# Patient Record
Sex: Female | Born: 1945 | Race: White | Hispanic: No | Smoking: Former smoker
Health system: Southern US, Community
[De-identification: ages and names within clinical notes are randomized; demographics above are authoritative.]

## PROBLEM LIST (undated history)

## (undated) DIAGNOSIS — D0510 Intraductal carcinoma in situ of unspecified breast: Secondary | ICD-10-CM

## (undated) DIAGNOSIS — C50919 Malignant neoplasm of unspecified site of unspecified female breast: Secondary | ICD-10-CM

## (undated) DIAGNOSIS — Z8669 Personal history of other diseases of the nervous system and sense organs: Secondary | ICD-10-CM

## (undated) DIAGNOSIS — R7303 Prediabetes: Secondary | ICD-10-CM

## (undated) DIAGNOSIS — H269 Unspecified cataract: Secondary | ICD-10-CM

## (undated) DIAGNOSIS — G4730 Sleep apnea, unspecified: Secondary | ICD-10-CM

## (undated) DIAGNOSIS — F419 Anxiety disorder, unspecified: Secondary | ICD-10-CM

## (undated) DIAGNOSIS — D649 Anemia, unspecified: Secondary | ICD-10-CM

## (undated) DIAGNOSIS — Z923 Personal history of irradiation: Secondary | ICD-10-CM

## (undated) HISTORY — DX: Personal history of other diseases of the nervous system and sense organs: Z86.69

## (undated) HISTORY — PX: UVULECTOMY: SHX2631

## (undated) HISTORY — DX: Anemia, unspecified: D64.9

## (undated) HISTORY — PX: APPENDECTOMY: SHX54

## (undated) HISTORY — PX: EYE SURGERY: SHX253

## (undated) HISTORY — PX: CHOLECYSTECTOMY: SHX55

## (undated) HISTORY — DX: Malignant neoplasm of unspecified site of unspecified female breast: C50.919

## (undated) HISTORY — DX: Unspecified cataract: H26.9

## (undated) HISTORY — PX: BUNIONECTOMY: SHX129

## (undated) HISTORY — PX: IRIDOTOMY / IRIDECTOMY: SHX165

---

## 1991-01-28 HISTORY — PX: BREAST EXCISIONAL BIOPSY: SUR124

## 2004-02-08 ENCOUNTER — Ambulatory Visit: Payer: Self-pay | Admitting: Family Medicine

## 2005-05-14 ENCOUNTER — Ambulatory Visit: Payer: Self-pay

## 2006-05-13 ENCOUNTER — Ambulatory Visit: Payer: Self-pay

## 2007-05-26 ENCOUNTER — Ambulatory Visit: Payer: Self-pay

## 2008-06-06 ENCOUNTER — Ambulatory Visit: Payer: Self-pay

## 2009-06-26 ENCOUNTER — Ambulatory Visit: Payer: Self-pay

## 2010-09-02 ENCOUNTER — Ambulatory Visit: Payer: Self-pay | Admitting: Family Medicine

## 2010-10-08 ENCOUNTER — Ambulatory Visit: Payer: Self-pay | Admitting: Gastroenterology

## 2010-10-09 LAB — PATHOLOGY REPORT

## 2011-09-03 ENCOUNTER — Ambulatory Visit: Payer: Self-pay | Admitting: Family Medicine

## 2012-09-07 ENCOUNTER — Ambulatory Visit: Payer: Self-pay | Admitting: Family Medicine

## 2013-09-08 ENCOUNTER — Ambulatory Visit: Payer: Self-pay | Admitting: Family Medicine

## 2014-09-14 ENCOUNTER — Other Ambulatory Visit: Payer: Self-pay | Admitting: Family Medicine

## 2014-10-18 ENCOUNTER — Telehealth: Payer: Self-pay

## 2014-10-18 NOTE — Telephone Encounter (Signed)
Opened in error

## 2014-10-24 ENCOUNTER — Other Ambulatory Visit: Payer: Self-pay | Admitting: Family Medicine

## 2014-10-24 DIAGNOSIS — Z1231 Encounter for screening mammogram for malignant neoplasm of breast: Secondary | ICD-10-CM

## 2014-11-30 ENCOUNTER — Ambulatory Visit
Admission: RE | Admit: 2014-11-30 | Discharge: 2014-11-30 | Disposition: A | Discharge status: Home / Self Care | Payer: Medicare Other | Source: Ambulatory Visit | Attending: Family Medicine | Admitting: Family Medicine

## 2014-11-30 DIAGNOSIS — Z1231 Encounter for screening mammogram for malignant neoplasm of breast: Secondary | ICD-10-CM | POA: Diagnosis not present

## 2015-05-30 DIAGNOSIS — H2513 Age-related nuclear cataract, bilateral: Secondary | ICD-10-CM | POA: Diagnosis not present

## 2015-07-17 DIAGNOSIS — H2189 Other specified disorders of iris and ciliary body: Secondary | ICD-10-CM | POA: Diagnosis not present

## 2015-07-17 DIAGNOSIS — H2513 Age-related nuclear cataract, bilateral: Secondary | ICD-10-CM | POA: Diagnosis not present

## 2015-07-17 DIAGNOSIS — H40033 Anatomical narrow angle, bilateral: Secondary | ICD-10-CM | POA: Diagnosis not present

## 2015-10-25 DIAGNOSIS — B001 Herpesviral vesicular dermatitis: Secondary | ICD-10-CM | POA: Diagnosis not present

## 2015-10-25 DIAGNOSIS — Z6827 Body mass index (BMI) 27.0-27.9, adult: Secondary | ICD-10-CM | POA: Diagnosis not present

## 2015-10-25 DIAGNOSIS — L299 Pruritus, unspecified: Secondary | ICD-10-CM | POA: Diagnosis not present

## 2015-10-25 DIAGNOSIS — E782 Mixed hyperlipidemia: Secondary | ICD-10-CM | POA: Diagnosis not present

## 2015-10-25 DIAGNOSIS — E663 Overweight: Secondary | ICD-10-CM | POA: Diagnosis not present

## 2015-10-25 DIAGNOSIS — R7303 Prediabetes: Secondary | ICD-10-CM | POA: Diagnosis not present

## 2015-10-25 DIAGNOSIS — Z1159 Encounter for screening for other viral diseases: Secondary | ICD-10-CM | POA: Diagnosis not present

## 2015-10-25 DIAGNOSIS — Z0000 Encounter for general adult medical examination without abnormal findings: Secondary | ICD-10-CM | POA: Diagnosis not present

## 2015-10-25 DIAGNOSIS — I781 Nevus, non-neoplastic: Secondary | ICD-10-CM | POA: Diagnosis not present

## 2015-11-22 DIAGNOSIS — R740 Nonspecific elevation of levels of transaminase and lactic acid dehydrogenase [LDH]: Secondary | ICD-10-CM | POA: Diagnosis not present

## 2015-11-30 DIAGNOSIS — H40033 Anatomical narrow angle, bilateral: Secondary | ICD-10-CM | POA: Diagnosis not present

## 2015-11-30 DIAGNOSIS — H2513 Age-related nuclear cataract, bilateral: Secondary | ICD-10-CM | POA: Diagnosis not present

## 2015-12-11 DIAGNOSIS — H40039 Anatomical narrow angle, unspecified eye: Secondary | ICD-10-CM | POA: Diagnosis not present

## 2015-12-11 DIAGNOSIS — H2189 Other specified disorders of iris and ciliary body: Secondary | ICD-10-CM | POA: Diagnosis not present

## 2015-12-11 DIAGNOSIS — H2513 Age-related nuclear cataract, bilateral: Secondary | ICD-10-CM | POA: Diagnosis not present

## 2015-12-24 ENCOUNTER — Other Ambulatory Visit: Payer: Self-pay | Admitting: Nurse Practitioner

## 2015-12-24 DIAGNOSIS — Z1231 Encounter for screening mammogram for malignant neoplasm of breast: Secondary | ICD-10-CM

## 2016-01-30 ENCOUNTER — Ambulatory Visit: Payer: Medicare Other

## 2016-03-07 ENCOUNTER — Ambulatory Visit
Admission: RE | Admit: 2016-03-07 | Discharge: 2016-03-07 | Disposition: A | Discharge status: Home / Self Care | Payer: Medicare Other | Source: Ambulatory Visit | Attending: Nurse Practitioner | Admitting: Nurse Practitioner

## 2016-03-07 DIAGNOSIS — Z1231 Encounter for screening mammogram for malignant neoplasm of breast: Secondary | ICD-10-CM | POA: Diagnosis not present

## 2016-07-08 DIAGNOSIS — H2513 Age-related nuclear cataract, bilateral: Secondary | ICD-10-CM | POA: Diagnosis not present

## 2016-07-08 DIAGNOSIS — H40033 Anatomical narrow angle, bilateral: Secondary | ICD-10-CM | POA: Diagnosis not present

## 2016-10-28 DIAGNOSIS — Z5329 Procedure and treatment not carried out because of patient's decision for other reasons: Secondary | ICD-10-CM | POA: Diagnosis not present

## 2016-10-28 DIAGNOSIS — B001 Herpesviral vesicular dermatitis: Secondary | ICD-10-CM | POA: Diagnosis not present

## 2016-10-28 DIAGNOSIS — E782 Mixed hyperlipidemia: Secondary | ICD-10-CM | POA: Diagnosis not present

## 2016-10-28 DIAGNOSIS — R740 Nonspecific elevation of levels of transaminase and lactic acid dehydrogenase [LDH]: Secondary | ICD-10-CM | POA: Diagnosis not present

## 2016-11-20 DIAGNOSIS — Z23 Encounter for immunization: Secondary | ICD-10-CM | POA: Diagnosis not present

## 2017-01-08 ENCOUNTER — Other Ambulatory Visit: Payer: Self-pay | Admitting: Nurse Practitioner

## 2017-01-08 DIAGNOSIS — Z1231 Encounter for screening mammogram for malignant neoplasm of breast: Secondary | ICD-10-CM

## 2017-03-16 ENCOUNTER — Ambulatory Visit
Admission: RE | Admit: 2017-03-16 | Discharge: 2017-03-16 | Disposition: A | Discharge status: Home / Self Care | Payer: Medicare Other | Source: Ambulatory Visit | Attending: Nurse Practitioner | Admitting: Nurse Practitioner

## 2017-03-16 DIAGNOSIS — Z1231 Encounter for screening mammogram for malignant neoplasm of breast: Secondary | ICD-10-CM | POA: Diagnosis not present

## 2017-06-09 DIAGNOSIS — H40033 Anatomical narrow angle, bilateral: Secondary | ICD-10-CM | POA: Diagnosis not present

## 2017-06-09 DIAGNOSIS — H2513 Age-related nuclear cataract, bilateral: Secondary | ICD-10-CM | POA: Diagnosis not present

## 2017-06-09 DIAGNOSIS — H43393 Other vitreous opacities, bilateral: Secondary | ICD-10-CM | POA: Diagnosis not present

## 2017-11-11 ENCOUNTER — Ambulatory Visit: Payer: Self-pay | Admitting: Nurse Practitioner

## 2017-11-11 ENCOUNTER — Other Ambulatory Visit: Payer: Self-pay

## 2017-11-11 ENCOUNTER — Ambulatory Visit (INDEPENDENT_AMBULATORY_CARE_PROVIDER_SITE_OTHER): Payer: Medicare Other | Admitting: Nurse Practitioner

## 2017-11-11 ENCOUNTER — Encounter: Payer: Self-pay | Admitting: Nurse Practitioner

## 2017-11-11 VITALS — Ht 67.0 in | Wt 124.0 lb

## 2017-11-11 DIAGNOSIS — F419 Anxiety disorder, unspecified: Secondary | ICD-10-CM | POA: Diagnosis not present

## 2017-11-11 DIAGNOSIS — Z7689 Persons encountering health services in other specified circumstances: Secondary | ICD-10-CM | POA: Diagnosis not present

## 2017-11-11 DIAGNOSIS — F5104 Psychophysiologic insomnia: Secondary | ICD-10-CM | POA: Diagnosis not present

## 2017-11-11 DIAGNOSIS — Z23 Encounter for immunization: Secondary | ICD-10-CM | POA: Diagnosis not present

## 2017-11-11 MED ORDER — TRAZODONE HCL 50 MG PO TABS: 25.0000 mg | ORAL_TABLET | Freq: Every evening | ORAL | 5 refills | Status: DC | PRN

## 2017-11-11 NOTE — Progress Notes (Signed)
Subjective:    Patient ID: Tracie Reed, female    DOB: 02-08-1945, 72 y.o.   MRN: [id number]  Tracie Reed is a 72 y.o. female presenting on 11/11/2017 for Establish Care (anxiety , insomnia. )   HPI Establish Care New Provider Pt last seen by PCP [facility] about 1 years ago.  Obtain records.   - Patient sees regular dentist at [facility] - Patient sees regular eye doc at [facility] - will need new optometrist  Last Colonoscopy 2012 - 10 year follow-up.  Anxiety Worsening over the last several months.  Patient notes recent care has been provided non-pharmacologically.  These self-care and coping skills are currently failing.  Patient reports she has been on medications > 15-30 years ago - Lithium, Valium, Prozac.  Does not particularly wish to restart these, however. - Has tried managing with meditation, yoga, exercise. 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  These things are leading to worsening anxiety.  Depression screen [facility] 2/9 12/15/2017 11/11/2017  Decreased Interest 0 0  Down, Depressed, Hopeless 1 0  PHQ - 2 Score 1 0  Altered sleeping - 3  Tired, decreased energy - 1  Change in appetite - 0  Feeling bad or failure about yourself  - 1  Trouble concentrating - 0  Moving slowly or fidgety/restless - 0  Suicidal thoughts - 0  PHQ-9 Score - 5  Difficult doing work/chores - Somewhat difficult    GAD 7 : Generalized Anxiety Score 11/11/2017  Nervous, Anxious, on Edge 3  Control/stop worrying 3  Worry too much - different things 3  Trouble relaxing 3  Restless 2  Easily annoyed or irritable 3  Afraid - awful might happen 3  Total GAD 7 Score 20  Anxiety Difficulty Somewhat difficult   Insomnia Largest concern is insomnia - sleeps 3-4 hours, wakes and has difficulty returning to sleep.  Wakes to urinate, leg cramps and has mind racing/wandering. Sleep  maintenance.   Has used melatonin 10 mg nightly without relief, lavendar/chamomile tea,   Tinnitus - worsening.  Is very loud now in LEFT ear.  Usually able to ignore it.  Is not leading to increased anxiety per patient report.   Past Medical History:  Diagnosis Date  . Anemia    Past Surgical History:  Procedure Laterality Date  . BREAST EXCISIONAL BIOPSY Right 1993   neg   Social History   Socioeconomic History  . Marital status: Significant Other    Spouse name: Not on file  . Number of children: 2  . Years of education: Not on file  . Highest education level: Bachelor's degree (e.g., BA, AB, BS)  Occupational History  . Occupation: self employed    Comment: Holiday representative  Social Needs  . Financial resource strain: Not hard at all  . Food insecurity:    Worry: Never true    Inability: Never true  . Transportation needs:    Medical: No    Non-medical: No  Tobacco Use  . Smoking status: Former Smoker    Last attempt to quit: 11/12/1987    Years since quitting: 30.1  . Smokeless tobacco: Never Used  Substance and Sexual Activity  . Alcohol use: Yes    Alcohol/week: 5.0 standard drinks    Types: 5 Glasses of wine per week    Comment: weekly  . Drug use: Never  . Sexual activity: Not  on file  Lifestyle  . Physical activity:    Days per week: 0 days    Minutes per session: 0 min  . Stress: Not at all  Relationships  . Social connections:    Talks on phone: Patient refused    Gets together: Patient refused    Attends religious service: Patient refused    Active member of club or organization: Patient refused    Attends meetings of clubs or organizations: Patient refused    Relationship status: Patient refused  . Intimate partner violence:    Fear of current or ex partner: Patient refused    Emotionally abused: Patient refused    Physically abused: Patient refused    Forced sexual activity: Patient refused  Other Topics Concern  .  Not on file  Social History Narrative  . Not on file   Family History  Problem Relation Age of Onset  . Breast cancer Neg Hx    Current Outpatient Medications on File Prior to Visit  Medication Sig  . triamcinolone ointment (KENALOG) 0.5 % APPLY AA BID UNTIL CLEARED   No current facility-administered medications on file prior to visit.     Review of Systems  Constitutional: Negative for activity change and appetite change.  HENT: Positive for tinnitus. Negative for congestion, dental problem, nosebleeds, rhinorrhea, sinus pain and voice change.   Eyes: Negative for visual disturbance.  Respiratory: Negative for cough, chest tightness and shortness of breath.   Cardiovascular: Negative for chest pain, palpitations and leg swelling.  Gastrointestinal: Negative for abdominal pain, constipation and diarrhea.  Endocrine: Negative for cold intolerance, heat intolerance and polyphagia.  Genitourinary: Negative for difficulty urinating.  Musculoskeletal: Positive for arthralgias.  Skin: Negative for color change, rash and wound.  Neurological: Negative for dizziness, seizures, speech difficulty, weakness and headaches.  Hematological: Negative for adenopathy. Does not bruise/bleed easily.  Psychiatric/Behavioral: Positive for sleep disturbance. The patient is nervous/anxious.    Per HPI unless specifically indicated above     Objective:    Ht 5\' 7"  (1.702 m)   Wt 124 lb (56.2 kg)   BMI 19.42 kg/m   Wt Readings from Last 3 Encounters:  12/15/17 126 lb (57.2 kg)  12/15/17 126 lb 3.2 oz (57.2 kg)  11/11/17 124 lb (56.2 kg)    Physical Exam  Constitutional: She is oriented to person, place, and time. She appears well-developed and well-nourished. No distress.  HENT:  Head: Normocephalic and atraumatic.  Cardiovascular: Normal rate, regular rhythm, S1 normal, S2 normal, normal heart sounds and intact distal pulses.  Pulmonary/Chest: Effort normal and breath sounds normal. No  respiratory distress.  Neurological: She is alert and oriented to person, place, and time.  Skin: Skin is warm and dry.  Psychiatric: Her behavior is normal. Judgment and thought content normal. Her mood appears anxious. Her speech is rapid and/or pressured and tangential. Cognition and memory are normal. She expresses no homicidal and no suicidal ideation. She expresses no suicidal plans and no homicidal plans.  Vitals reviewed.    No results found for this or any previous visit.    Assessment & Plan:   Problem List Items Addressed This Visit      Other   Anxiety   Relevant Medications   traZODone (DESYREL) 50 MG tablet   Psychophysiological insomnia - Primary For anxiety and insomnia - is currently uncontrolled.  Anxiety likely worsening 2/2 poor sleep.  Patient states is most bothered by insomnia and has previously had good non-pharm management for anxiety.  Plan: 1. Start trazodone 1/2 tab (25 mg) at bedtime 2. Resume non-pharm stress management tools. 3. FOLLOW-UP 4 weeks.   Relevant Medications   traZODone (DESYREL) 50 MG tablet    Other Visit Diagnoses    Encounter to establish care     Previous PCP was at [facility].  Records will be requested.  Past medical, family, and surgical history reviewed w/ pt.     Needs flu shot     Pt > age 12.  Needs annual influenza vaccine.  Plan: 1. Administer high dose fluzone today.    Relevant Orders   Flu vaccine HIGH DOSE PF (Fluzone High dose) (Completed)      Meds ordered this encounter  Medications  . traZODone (DESYREL) 50 MG tablet    Sig: Take 0.5-1 tablets (25-50 mg total) by mouth at bedtime as needed for sleep.    Dispense:  30 tablet    Refill:  5    Order Specific Question:   Supervising Provider    Answer:   Olin Hauser [[id number]]   Follow up plan: Return in about 4 weeks (around 12/09/2017) for anxiety and insomnia AND MWV with Tiffany .   Cassell Smiles, DNP, AGPCNP-BC Adult  Gerontology Primary Care Nurse Practitioner [facility] 11/11/2017, 9:47 AM

## 2017-11-11 NOTE — Patient Instructions (Addendum)
Tracie Reed,   Thank you for coming in to clinic today.  1. For sleep: - Make sure your melatonin has USP seal for certification.   - Nature Made always has this certification. - Take 5-10 mg about 30 minutes before sleep. - Also consider extended release formulation for melatonin to extend its active life at night.  2. Anxiety: - Continue non-medicine options for coping.  Bring back a regular meditation or mindfulness.  - Consider medications in about 4 weeks.  Please schedule a follow-up appointment with Cassell Smiles, AGNP. Return in about 4 weeks (around 12/09/2017) for anxiety and insomnia AND MWV with Tiffany .  If you have any other questions or concerns, please feel free to call the clinic or send a message through [place]. You may also schedule an earlier appointment if necessary.  You will receive a survey after today's visit either digitally by e-mail or paper by [employer]. Your experiences and feedback matter to [place].  Please respond so we know how we are doing as we provide care for you.   Cassell Smiles, DNP, AGNP-BC Adult Gerontology Nurse Practitioner [place]

## 2017-12-15 ENCOUNTER — Ambulatory Visit (INDEPENDENT_AMBULATORY_CARE_PROVIDER_SITE_OTHER): Payer: Medicare Other | Admitting: Nurse Practitioner

## 2017-12-15 ENCOUNTER — Other Ambulatory Visit: Payer: Self-pay | Admitting: Nurse Practitioner

## 2017-12-15 ENCOUNTER — Ambulatory Visit: Payer: Medicare Other

## 2017-12-15 ENCOUNTER — Other Ambulatory Visit: Payer: Self-pay

## 2017-12-15 ENCOUNTER — Ambulatory Visit (INDEPENDENT_AMBULATORY_CARE_PROVIDER_SITE_OTHER): Payer: Medicare Other

## 2017-12-15 ENCOUNTER — Encounter: Payer: Self-pay | Admitting: Nurse Practitioner

## 2017-12-15 VITALS — BP 117/66 | HR 88 | Temp 98.1°F | Ht 67.0 in | Wt 126.0 lb

## 2017-12-15 VITALS — BP 117/66 | HR 88 | Temp 98.1°F | Resp 17 | Ht 67.0 in | Wt 126.2 lb

## 2017-12-15 DIAGNOSIS — N760 Acute vaginitis: Secondary | ICD-10-CM | POA: Diagnosis not present

## 2017-12-15 DIAGNOSIS — B001 Herpesviral vesicular dermatitis: Secondary | ICD-10-CM

## 2017-12-15 DIAGNOSIS — Z136 Encounter for screening for cardiovascular disorders: Secondary | ICD-10-CM

## 2017-12-15 DIAGNOSIS — F5104 Psychophysiologic insomnia: Secondary | ICD-10-CM

## 2017-12-15 DIAGNOSIS — Z0000 Encounter for general adult medical examination without abnormal findings: Secondary | ICD-10-CM

## 2017-12-15 DIAGNOSIS — R7303 Prediabetes: Secondary | ICD-10-CM

## 2017-12-15 DIAGNOSIS — Z130 Encounter for screening for diseases of the blood and blood-forming organs and certain disorders involving the immune mechanism: Secondary | ICD-10-CM

## 2017-12-15 DIAGNOSIS — F419 Anxiety disorder, unspecified: Secondary | ICD-10-CM

## 2017-12-15 DIAGNOSIS — Z13220 Encounter for screening for lipoid disorders: Secondary | ICD-10-CM

## 2017-12-15 LAB — POCT WET PREP (WET MOUNT)
Clue Cells Wet Prep Whiff POC: NEGATIVE
Trichomonas Wet Prep HPF POC: ABSENT

## 2017-12-15 MED ORDER — VALACYCLOVIR HCL 1 G PO TABS: ORAL_TABLET | ORAL | 5 refills | Status: DC

## 2017-12-15 MED ORDER — ACYCLOVIR 5 % EX OINT: 1.0000 "application " | TOPICAL_OINTMENT | CUTANEOUS | 1 refills | Status: AC | PRN

## 2017-12-15 NOTE — Patient Instructions (Addendum)
Tracie Reed,   Thank you for coming in to clinic today.  1. Continue trazodone 25 mg (1/2 tablet) nightly for sleep and anxiety.  2. Continue all non medicine options for anxiety management.  3. Continue vaginal moisturizers as needed.  Consider any other brand similar to Vagisil, Coconut Oil, or request topical estrogen from me at another time.  4. Refills sent for valacyclovir and Zovirax.  Please schedule a follow-up appointment with Cassell Smiles, AGNP. Return in about 4 months (around 04/15/2018) for anxiety.  If you have any other questions or concerns, please feel free to call the clinic or send a message through [place]. You may also schedule an earlier appointment if necessary.  You will receive a survey after today's visit either digitally by e-mail or paper by [employer]. Your experiences and feedback matter to [place].  Please respond so we know how we are doing as we provide care for you.   Cassell Smiles, DNP, AGNP-BC Adult Gerontology Nurse Practitioner [facility], [facility]   Atrophic Vaginitis Atrophic vaginitis is a condition in which the tissues that line the vagina become dry and thin. This condition is most common in women who have stopped having regular menstrual periods (menopause). This usually starts when a woman is 48-61 years old. Estrogen helps to keep the vagina moist. It stimulates the vagina to produce a clear fluid that lubricates the vagina for sexual intercourse. This fluid also protects the vagina from infection. Lack of estrogen can cause the lining of the vagina to get thinner and dryer. The vagina may also shrink in size. It may become less elastic. Atrophic vaginitis tends to get worse over time as a woman's estrogen level drops. What are the causes? This condition is caused by the normal drop in estrogen that happens around the time of menopause. What increases the risk? Certain conditions or situations may lower a woman's estrogen  level, which increases her risk of atrophic vaginitis. These include:  Taking medicine that blocks estrogen.  Having ovaries removed surgically.  Being treated for cancer with X-ray treatment (radiation) or medicines (chemotherapy).  Exercising very hard and often.  Having an eating disorder (anorexia).  Giving birth or breastfeeding.  Being over the age of 29.  Smoking.  What are the signs or symptoms? Symptoms of this condition include:  Pain, soreness, or bleeding during sexual intercourse (dyspareunia).  Vaginal burning, irritation, or itching.  Pain or bleeding during a vaginal examination using a speculum (pelvic exam).  Loss of interest in sexual activity.  Having burning pain when passing urine.  Vaginal discharge that is brown or yellow.  In some cases, there are no symptoms. How is this diagnosed? This condition is diagnosed with a medical history and physical exam. This will include a pelvic exam that checks whether the inside of your vagina appears pale, thin, or dry. Rarely, you may also have other tests, including:  A urine test.  A test that checks the acid balance in your vaginal fluid (acid balance test).  How is this treated? Treatment for this condition may depend on the severity of your symptoms. Treatment may include:  Using an over-the-counter vaginal lubricant before you have sexual intercourse.  Using a long-acting vaginal moisturizer.  Using low-dose vaginal estrogen for moderate to severe symptoms that do not respond to other treatments. Options include creams, tablets, and inserts (vaginal rings). Before using vaginal estrogen, tell your health care provider if you have a history of: ? Breast cancer. ? Endometrial  cancer. ? Blood clots.  Taking medicines. You may be able to take a daily pill for dyspareunia. Discuss all of the risks of this medicine with your health care provider. It is usually not recommended for women who have a family  history or personal history of breast cancer.  If your symptoms are very mild and you are not sexually active, you may not need treatment. Follow these instructions at home:  Take medicines only as directed by your health care provider. Do not use herbal or alternative medicines unless your health care provider says that you can.  Use over-the-counter creams, lubricants, or moisturizers for dryness only as directed by your health care provider.  If your atrophic vaginitis is caused by menopause, discuss all of your menopausal symptoms and treatment options with your health care provider.  Do not douche.  Do not use products that can make your vagina dry. These include: ? Scented feminine sprays. ? Scented tampons. ? Scented soaps.  If it hurts to have sex, talk with your sexual partner. Contact a health care provider if:  Your discharge looks different than normal.  Your vagina has an unusual smell.  You have new symptoms.  Your symptoms do not improve with treatment.  Your symptoms get worse. This information is not intended to replace advice given to you by your health care provider. Make sure you discuss any questions you have with your health care provider. Document Released: 05/30/2014 Document Revised: 06/21/2015 Document Reviewed: 01/04/2014 Elsevier Interactive Patient Education  [employer].

## 2017-12-15 NOTE — Progress Notes (Signed)
Subjective:   Tracie Reed is a 72 y.o. female who presents for an Initial Medicare Annual Wellness Visit.  Review of Systems    N/A  Cardiac Risk Factors include: advanced age (>57men, >59 women);dyslipidemia     Objective:    Today's Vitals   12/15/17 1133  BP: 117/66  Pulse: 88  Temp: 98.1 F (36.7 C)  TempSrc: Oral  Weight: 126 lb (57.2 kg)  PainSc: 0-No pain   Body mass index is 19.73 kg/m.  Advanced Directives 12/15/2017  Does Patient Have a Medical Advance Directive? Yes  Type of Paramedic of [place];Living will  Copy of [place] in Chart? No - copy requested    Current Medications (verified) Outpatient Encounter Medications as of 12/15/2017  Medication Sig  . acyclovir ointment (ZOVIRAX) 5 % Apply 1 application topically every 3 (three) hours as needed.  . traZODone (DESYREL) 50 MG tablet Take 0.5-1 tablets (25-50 mg total) by mouth at bedtime as needed for sleep.  Marland Kitchen triamcinolone ointment (KENALOG) 0.5 % APPLY AA BID UNTIL CLEARED  . valACYclovir (VALTREX) 1000 MG tablet Take 2 tablets (2,000 mg) at the start of your cold sore.  Take 1 tablet (1,000 mg) daily until sores have resolved.   No facility-administered encounter medications on file as of 12/15/2017.     Allergies (verified) Patient has no known allergies.   History: Past Medical History:  Diagnosis Date  . Anemia    Past Surgical History:  Procedure Laterality Date  . BREAST EXCISIONAL BIOPSY Right 1993   neg   Family History  Problem Relation Age of Onset  . Breast cancer Neg Hx    Social History   Socioeconomic History  . Marital status: Significant Other    Spouse name: Not on file  . Number of children: 2  . Years of education: Not on file  . Highest education level: Bachelor's degree (e.g., BA, AB, BS)  Occupational History  . Occupation: self employed    Comment: Holiday representative  Social Needs  .  Financial resource strain: Not hard at all  . Food insecurity:    Worry: Never true    Inability: Never true  . Transportation needs:    Medical: No    Non-medical: No  Tobacco Use  . Smoking status: Former Smoker    Last attempt to quit: 11/12/1987    Years since quitting: 30.1  . Smokeless tobacco: Never Used  Substance and Sexual Activity  . Alcohol use: Yes    Alcohol/week: 5.0 standard drinks    Types: 5 Glasses of wine per week    Comment: weekly  . Drug use: Never  . Sexual activity: Not on file  Lifestyle  . Physical activity:    Days per week: 0 days    Minutes per session: 0 min  . Stress: Not at all  Relationships  . Social connections:    Talks on phone: Patient refused    Gets together: Patient refused    Attends religious service: Patient refused    Active member of club or organization: Patient refused    Attends meetings of clubs or organizations: Patient refused    Relationship status: Patient refused  Other Topics Concern  . Not on file  Social History Narrative  . Not on file    Tobacco Counseling Counseling given: Not Answered   Clinical Intake:  Pre-visit preparation completed: Yes  Pain : No/denies pain Pain Score: 0-No pain  Nutritional Status: BMI of 19-24  Normal Nutritional Risks: None Diabetes: No  How often do you need to have someone help you when you read instructions, pamphlets, or other written materials from your doctor or pharmacy?: 1 - Never  Interpreter Needed?: No  Information entered by :: [facility], LPN   Activities of Daily Living In your present state of health, do you have any difficulty performing the following activities: 12/15/2017 12/15/2017  Hearing? 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  tinnitis  Vision? N Y  Comment Wears eye glasses.  -  Difficulty concentrating or making decisions? N N  Walking or climbing stairs? N N  Dressing or bathing? N N  Doing errands, shopping? N N  Preparing Food  and eating ? N -  Using the Toilet? N -  In the past six months, have you accidently leaked urine? N -  Do you have problems with loss of bowel control? N -  Managing your Medications? N -  Managing your Finances? N -  Housekeeping or managing your Housekeeping? N -  Some recent data might be hidden     Immunizations and Health Maintenance Immunization History  Administered Date(s) Administered  . Influenza, High Dose Seasonal PF 11/11/2017   Health Maintenance Due  Topic Date Due  . DEXA SCAN  07/21/2010    Patient Care Team: Mikey College, NP as PCP - General (Nurse Practitioner)  Indicate any recent Medical Services you may have received from other than [facility] providers in the past year (date may be approximate).     Assessment:   This is a routine wellness examination for [facility].  Hearing/Vision screen No exam data present  Dietary issues and exercise activities discussed: Current Exercise Habits: Home exercise routine, Type of exercise: strength training/weights;stretching, Time (Minutes): 10, Frequency (Times/Week): 7, Weekly Exercise (Minutes/Week): 70, Intensity: Mild, Exercise limited by: None identified  Goals    . DIET - INCREASE WATER INTAKE     Recommend to drink at least 6-8 8oz glasses of water per day.       Depression Screen PHQ 2/9 Scores 12/15/2017 11/11/2017  PHQ - 2 Score 1 0  PHQ- 9 Score - 5    Fall Risk Fall Risk  12/15/2017  Falls in the past year? 1  Number falls in past yr: 0  Injury with Fall? 1  Comment cut on face  Risk for fall due to : Other (Comment)  Risk for fall due to: Comment Had been drinking alcohol and was under the influence.     FALL RISK PREVENTION PERTAINING TO THE HOME:  Any stairs in or around the home WITH handrails? Yes  Home free of loose throw rugs in walkways, pet beds, electrical cords, etc? Yes  Adequate lighting in your home to reduce risk of falls? Yes   ASSISTIVE DEVICES UTILIZED TO PREVENT  FALLS:  Life alert? No  Use of a cane, walker or w/c? No  Grab bars in the bathroom? No  Shower chair or bench in shower? No  Elevated toilet seat or a handicapped toilet? No    TIMED UP AND GO:  Was the test performed? No .     Cognitive Function: Declined today.        Screening Tests Health Maintenance  Topic Date Due  . DEXA SCAN  07/21/2010  . PNA vac Low Risk Adult (1 of 2 - PCV13) 12/16/2018 (Originally 07/21/2010)  . MAMMOGRAM  03/17/2019  . COLONOSCOPY  10/07/2020  .  TETANUS/TDAP  12/16/2022  . INFLUENZA VACCINE  Completed  . Hepatitis C Screening  Completed    Qualifies for Shingles Vaccine? Yes . Due for Shingrix. Education has been provided regarding the importance of this vaccine. Pt has been advised to call insurance company to determine out of pocket expense. Advised may also receive vaccine at local pharmacy or Health Dept. Verbalized acceptance and understanding.  Tdap: Up to date   Flu Vaccine: Up to date  Pneumococcal Vaccine: Due for Pneumococcal vaccine. Does the patient want to receive this vaccine today?  No . Education has been provided regarding the importance of this vaccine but still declined. Advised may receive this vaccine at local pharmacy or Health Dept. Aware to provide a copy of the vaccination record if obtained from local pharmacy or Health Dept. Verbalized acceptance and understanding.   Cancer Screenings:  Colorectal Screening: Completed 10/08/10. Repeat every 10 years.  Mammogram: Completed 028/18/19.   Bone Density: Currently due- pt declined order today.   Lung Cancer Screening: (Low Dose CT Chest recommended if Age 21-80 years, 30 pack-year currently smoking OR have quit w/in 15years.) does not qualify.    Additional Screening:  Hepatitis C Screening: Up to date  Vision Screening: Recommended annual ophthalmology exams for early detection of glaucoma and other disorders of the eye.  Dental Screening: Recommended annual  dental exams for proper oral hygiene  Community Resource Referral:  CRR required this visit?  No       Plan:  I have personally reviewed and addressed the Medicare Annual Wellness questionnaire and have noted the following in the patient's chart:  A. Medical and social history B. Use of alcohol, tobacco or illicit drugs  C. Current medications and supplements D. Functional ability and status E.  Nutritional status F.  Physical activity G. Advance directives H. List of other physicians I.  Hospitalizations, surgeries, and ER visits in previous 12 months J.  [place] such as hearing and vision if needed, cognitive and depression L. Referrals and appointments - none  In addition, I have reviewed and discussed with patient certain preventive protocols, quality metrics, and best practice recommendations. A written personalized care plan for preventive services as well as general preventive health recommendations were provided to patient.  See attached scanned questionnaire for additional information.   Signed,  Fabio Neighbors, LPN Nurse Health Advisor   Nurse Recommendations: Pt declined a DEXA referral today.

## 2017-12-15 NOTE — Progress Notes (Signed)
Subjective:    Patient ID: Tracie Reed, female    DOB: 26-Feb-1945, 72 y.o.   MRN: [id number]  Tracie Reed is a 72 y.o. female presenting on 12/15/2017 for Anxiety and Mouth Lesions (pt requesting a refill on Valtrex and Zovirax 5% )   HPI Anxiety Trazodone is helping significantly for sleep and anxiety.  Is having good sleep onset, fewer sleep awakenings, and easy return to sleep once waking in middle of the night.  She has noticed "even more improvement lately."  Is able to get back into meditating in mornings when she wakes.  Feels aggravation, but is able to stop this much more easily than in past. - Has grogginess about 1 hour after awakening, but none immediately.  No other side effects.  - has noted improved leg cramps, floater gone, increased libido since getting better sleep.  Mouth Lesions - Uses more frequently in summer, but on average has cold sores about once every 2-3 months.  Has used zovirax and valacylovir with good success in past.  Vaginal itching Increased sexual intercourse and has new vaginal itching.  Patient has had single partner over last 25 years.  - Has started using vagisil and is having only minimal improvement in symptoms. - Denies dyspareunia.  Social History   Tobacco Use  . Smoking status: Former Smoker    Last attempt to quit: 11/12/1987    Years since quitting: 30.1  . Smokeless tobacco: Never Used  Substance Use Topics  . Alcohol use: Yes    Alcohol/week: 5.0 standard drinks    Types: 5 Glasses of wine per week    Comment: weekly  . Drug use: Never    Review of Systems Per HPI unless specifically indicated above     Objective:    BP 117/66 (BP Location: Left Arm, Patient Position: Sitting, Cuff Size: Small)   Pulse 88   Temp 98.1 F (36.7 C) (Oral)   Resp 17   Ht 5\' 7"  (1.702 m)   Wt 126 lb 3.2 oz (57.2 kg)   BMI 19.77 kg/m   Wt Readings from Last 3 Encounters:  12/15/17 126 lb 3.2 oz (57.2 kg)  11/11/17 124 lb (56.2  kg)    Physical Exam  Constitutional: She is oriented to person, place, and time. She appears well-developed and well-nourished. No distress.  HENT:  Head: Normocephalic and atraumatic.  Mouth/Throat: Uvula is midline, oropharynx is clear and moist and mucous membranes are normal. No oral lesions. Tonsils are 0 on the right. Tonsils are 0 on the left.  Cardiovascular: Normal rate, regular rhythm, S1 normal, S2 normal, normal heart sounds and intact distal pulses.  Pulmonary/Chest: Effort normal and breath sounds normal. No respiratory distress.  Abdominal: Hernia confirmed negative in the right inguinal area and confirmed negative in the left inguinal area.  Genitourinary: Uterus normal. Pelvic exam was performed with patient supine. No labial fusion. There is no rash, tenderness, lesion or injury on the right labia. There is no rash, tenderness, lesion or injury on the left labia. Right adnexum displays no mass, no tenderness and no fullness. Left adnexum displays no mass, no tenderness and no fullness. There is erythema in the vagina. No tenderness or bleeding in the vagina. No foreign body in the vagina. No signs of injury around the vagina. No vaginal discharge found.  Lymphadenopathy: No inguinal adenopathy noted on the right or left side.  Neurological: She is alert and oriented to person, place, and time.  Skin: Skin is warm  and dry. Capillary refill takes less than 2 seconds.  Psychiatric: She has a normal mood and affect. Her speech is normal and behavior is normal. Judgment and thought content normal. Cognition and memory are normal.  Vitals reviewed.      Assessment & Plan:   Problem List Items Addressed This Visit      Digestive   Recurrent cold sores Stable, but occurs with periods of high stress and are not predictable.  Patient no longer has any medications as she used this last week. - refills provided - Follow-up prn.   Relevant Medications   valACYclovir (VALTREX) 1000  MG tablet   acyclovir ointment (ZOVIRAX) 5 %     Other   Anxiety - Primary   Psychophysiological insomnia Anxiety and insomnia significantly improved with use of Trazodone at bedtime.  Patient taking 1/2 tab and tolerates well without side effects.  No has improved non-pharm management as well.  Plan: 1. Continue trazodone 25 mg at bedtime 2. Continue mindfulness, meditation, and other non-pharm strategies. 3. Continue sleep hygiene. 4. Follow-up 4 months     Other Visit Diagnoses    Acute vaginitis     Patient with atrophic vaginitis.  No BV, Yeast infection.  Patient has started using Vagisil with some relief.  Is deficient with estrogen as postmenopausal woman.  Plan: 1. Try any OTC vaginal moisturizer that is best, can also consider coconut oil as moisturizer. 2. May consider future topical estrogen cream if needed. 3. Follow-up prn.   Relevant Orders   POCT Wet Prep [facility]) (Completed)      Meds ordered this encounter  Medications  . valACYclovir (VALTREX) 1000 MG tablet    Sig: Take 2 tablets (2,000 mg) at the start of your cold sore.  Take 1 tablet (1,000 mg) daily until sores have resolved.    Dispense:  15 tablet    Refill:  5  . acyclovir ointment (ZOVIRAX) 5 %    Sig: Apply 1 application topically every 3 (three) hours as needed.    Dispense:  15 g    Refill:  1    Order Specific Question:   Supervising Provider    Answer:   Olin Hauser [[id number]]    Follow up plan: Return in about 4 months (around 04/15/2018) for anxiety.  Cassell Smiles, DNP, AGPCNP-BC Adult Gerontology Primary Care Nurse Practitioner [facility] 12/15/2017, 10:41 AM

## 2017-12-15 NOTE — Patient Instructions (Addendum)
Tracie Reed , Thank you for taking time to come for your Medicare Wellness Visit. I appreciate your ongoing commitment to your health goals. Please review the following plan we discussed and let me know if I can assist you in the future.   Screening recommendations/referrals: Colonoscopy: Up to date, due 09/2020 Mammogram: Up to date, due 02/2018 Bone Density: Pt declines order today.  Recommended yearly ophthalmology/optometry visit for glaucoma screening and checkup Recommended yearly dental visit for hygiene and checkup  Vaccinations: Influenza vaccine: Up to date Pneumococcal vaccine: Pt declines today.  Tdap vaccine: Up to date, due 11/2022 Shingles vaccine: Pt declines today.     Advanced directives: Please bring a copy of your POA (Power of Attorney) and/or Living Will to your next appointment.   Conditions/risks identified: Recommend to drink at least 6-8 8oz glasses of water per day.  Next appointment: 12/21/18 @ 10:45 AM with [facility].   Preventive Care 43 Years and Older, Female Preventive care refers to lifestyle choices and visits with your health care provider that can promote health and wellness. What does preventive care include?  A yearly physical exam. This is also called an annual well check.  Dental exams once or twice a year.  Routine eye exams. Ask your health care provider how often you should have your eyes checked.  Personal lifestyle choices, including:  Daily care of your teeth and gums.  Regular physical activity.  Eating a healthy diet.  Avoiding tobacco and drug use.  Limiting alcohol use.  Practicing safe sex.  Taking low-dose aspirin every day.  Taking vitamin and mineral supplements as recommended by your health care provider. What happens during an annual well check? The services and screenings done by your health care provider during your annual well check will depend on your age, overall health, lifestyle risk factors, and family  history of disease. Counseling  Your health care provider may ask you questions about your:  Alcohol use.  Tobacco use.  Drug use.  Emotional well-being.  Home and relationship well-being.  Sexual activity.  Eating habits.  History of falls.  Memory and ability to understand (cognition).  Work and work Statistician.  Reproductive health. Screening  You may have the following tests or measurements:  Height, weight, and BMI.  Blood pressure.  Lipid and cholesterol levels. These may be checked every 5 years, or more frequently if you are over 44 years old.  Skin check.  Lung cancer screening. You may have this screening every year starting at age 9 if you have a 30-pack-year history of smoking and currently smoke or have quit within the past 15 years.  Fecal occult blood test (FOBT) of the stool. You may have this test every year starting at age 63.  Flexible sigmoidoscopy or colonoscopy. You may have a sigmoidoscopy every 5 years or a colonoscopy every 10 years starting at age 84.  Hepatitis C blood test.  Hepatitis B blood test.  Sexually transmitted disease (STD) testing.  Diabetes screening. This is done by checking your blood sugar (glucose) after you have not eaten for a while (fasting). You may have this done every 1-3 years.  Bone density scan. This is done to screen for osteoporosis. You may have this done starting at age 68.  Mammogram. This may be done every 1-2 years. Talk to your health care provider about how often you should have regular mammograms. Talk with your health care provider about your test results, treatment options, and if necessary, the need for  more tests. Vaccines  Your health care provider may recommend certain vaccines, such as:  Influenza vaccine. This is recommended every year.  Tetanus, diphtheria, and acellular pertussis (Tdap, Td) vaccine. You may need a Td booster every 10 years.  Zoster vaccine. You may need this after  age 44.  Pneumococcal 13-valent conjugate (PCV13) vaccine. One dose is recommended after age 75.  Pneumococcal polysaccharide (PPSV23) vaccine. One dose is recommended after age 21. Talk to your health care provider about which screenings and vaccines you need and how often you need them. This information is not intended to replace advice given to you by your health care provider. Make sure you discuss any questions you have with your health care provider. Document Released: 02/09/2015 Document Revised: 10/03/2015 Document Reviewed: 11/14/2014 Elsevier Interactive Patient Education  2017 [place] Prevention in the Home Falls can cause injuries. They can happen to people of all ages. There are many things you can do to make your home safe and to help prevent falls. What can I do on the outside of my home?  Regularly fix the edges of walkways and driveways and fix any cracks.  Remove anything that might make you trip as you walk through a door, such as a raised step or threshold.  Trim any bushes or trees on the path to your home.  Use bright outdoor lighting.  Clear any walking paths of anything that might make someone trip, such as rocks or tools.  Regularly check to see if handrails are loose or broken. Make sure that both sides of any steps have handrails.  Any raised decks and porches should have guardrails on the edges.  Have any leaves, snow, or ice cleared regularly.  Use sand or salt on walking paths during winter.  Clean up any spills in your garage right away. This includes oil or grease spills. What can I do in the bathroom?  Use night lights.  Install grab bars by the toilet and in the tub and shower. Do not use towel bars as grab bars.  Use non-skid mats or decals in the tub or shower.  If you need to sit down in the shower, use a plastic, non-slip stool.  Keep the floor dry. Clean up any water that spills on the floor as soon as it  happens.  Remove soap buildup in the tub or shower regularly.  Attach bath mats securely with double-sided non-slip rug tape.  Do not have throw rugs and other things on the floor that can make you trip. What can I do in the bedroom?  Use night lights.  Make sure that you have a light by your bed that is easy to reach.  Do not use any sheets or blankets that are too big for your bed. They should not hang down onto the floor.  Have a firm chair that has side arms. You can use this for support while you get dressed.  Do not have throw rugs and other things on the floor that can make you trip. What can I do in the kitchen?  Clean up any spills right away.  Avoid walking on wet floors.  Keep items that you use a lot in easy-to-reach places.  If you need to reach something above you, use a strong step stool that has a grab bar.  Keep electrical cords out of the way.  Do not use floor polish or wax that makes floors slippery. If you must use wax, use non-skid  floor wax.  Do not have throw rugs and other things on the floor that can make you trip. What can I do with my stairs?  Do not leave any items on the stairs.  Make sure that there are handrails on both sides of the stairs and use them. Fix handrails that are broken or loose. Make sure that handrails are as long as the stairways.  Check any carpeting to make sure that it is firmly attached to the stairs. Fix any carpet that is loose or worn.  Avoid having throw rugs at the top or bottom of the stairs. If you do have throw rugs, attach them to the floor with carpet tape.  Make sure that you have a light switch at the top of the stairs and the bottom of the stairs. If you do not have them, ask someone to add them for you. What else can I do to help prevent falls?  Wear shoes that:  Do not have high heels.  Have rubber bottoms.  Are comfortable and fit you well.  Are closed at the toe. Do not wear sandals.  If you  use a stepladder:  Make sure that it is fully opened. Do not climb a closed stepladder.  Make sure that both sides of the stepladder are locked into place.  Ask someone to hold it for you, if possible.  Clearly mark and make sure that you can see:  Any grab bars or handrails.  First and last steps.  Where the edge of each step is.  Use tools that help you move around (mobility aids) if they are needed. These include:  Canes.  Walkers.  Scooters.  Crutches.  Turn on the lights when you go into a dark area. Replace any light bulbs as soon as they burn out.  Set up your furniture so you have a clear path. Avoid moving your furniture around.  If any of your floors are uneven, fix them.  If there are any pets around you, be aware of where they are.  Review your medicines with your doctor. Some medicines can make you feel dizzy. This can increase your chance of falling. Ask your doctor what other things that you can do to help prevent falls. This information is not intended to replace advice given to you by your health care provider. Make sure you discuss any questions you have with your health care provider. Document Released: 11/09/2008 Document Revised: 06/21/2015 Document Reviewed: 02/17/2014 Elsevier Interactive Patient Education  2017 [employer].

## 2017-12-16 LAB — LIPID PANEL
Cholesterol: 263 mg/dL — ABNORMAL HIGH (ref <200)
HDL: 117 mg/dL (ref >50)
LDL Cholesterol (Calc): 132 mg/dL (calc) — ABNORMAL HIGH
Non-HDL Cholesterol (Calc): 146 mg/dL (calc) — ABNORMAL HIGH (ref <130)
Total CHOL/HDL Ratio: 2.2 (calc) (ref <5.0)
Triglycerides: 58 mg/dL (ref <150)

## 2017-12-16 LAB — CBC WITH DIFFERENTIAL/PLATELET
Basophils Absolute: 70 cells/uL (ref 0–200)
Basophils Relative: 0.9 %
Eosinophils Absolute: 39 cells/uL (ref 15–500)
Eosinophils Relative: 0.5 %
HCT: 41.1 % (ref 35.0–45.0)
Hemoglobin: 14.4 g/dL (ref 11.7–15.5)
Lymphs Abs: 1459 cells/uL (ref 850–3900)
MCH: 32.3 pg (ref 27.0–33.0)
MCHC: 35 g/dL (ref 32.0–36.0)
MCV: 92.2 fL (ref 80.0–100.0)
MPV: 11 fL (ref 7.5–12.5)
Monocytes Relative: 8.3 %
Neutro Abs: 5585 cells/uL (ref 1500–7800)
Neutrophils Relative %: 71.6 %
Platelets: 238 10*3/uL (ref 140–400)
RBC: 4.46 10*6/uL (ref 3.80–5.10)
RDW: 13.2 % (ref 11.0–15.0)
Total Lymphocyte: 18.7 %
WBC mixed population: 647 cells/uL (ref 200–950)
WBC: 7.8 10*3/uL (ref 3.8–10.8)

## 2017-12-16 LAB — COMPLETE METABOLIC PANEL WITH GFR
AG Ratio: 2.2 (calc) (ref 1.0–2.5)
ALT: 29 U/L (ref 6–29)
AST: 44 U/L — ABNORMAL HIGH (ref 10–35)
Albumin: 4.7 g/dL (ref 3.6–5.1)
Alkaline phosphatase (APISO): 46 U/L (ref 33–130)
BUN: 9 mg/dL (ref 7–25)
CO2: 23 mmol/L (ref 20–32)
Calcium: 9.6 mg/dL (ref 8.6–10.4)
Chloride: 99 mmol/L (ref 98–110)
Creat: 0.78 mg/dL (ref 0.60–0.93)
GFR, Est African American: 88 mL/min/{1.73_m2} (ref >=60)
GFR, Est Non African American: 76 mL/min/{1.73_m2} (ref >=60)
Globulin: 2.1 g/dL (calc) (ref 1.9–3.7)
Glucose, Bld: 74 mg/dL (ref 65–99)
Potassium: 3.9 mmol/L (ref 3.5–5.3)
Sodium: 140 mmol/L (ref 135–146)
Total Bilirubin: 0.6 mg/dL (ref 0.2–1.2)
Total Protein: 6.8 g/dL (ref 6.1–8.1)

## 2017-12-16 LAB — HEMOGLOBIN A1C
Hgb A1c MFr Bld: 5.2 % of total Hgb (ref <5.7)
Mean Plasma Glucose: 103 (calc)
eAG (mmol/L): 5.7 (calc)

## 2017-12-18 ENCOUNTER — Encounter: Payer: Self-pay | Admitting: Nurse Practitioner

## 2017-12-18 DIAGNOSIS — F5104 Psychophysiologic insomnia: Secondary | ICD-10-CM | POA: Insufficient documentation

## 2017-12-18 DIAGNOSIS — B001 Herpesviral vesicular dermatitis: Secondary | ICD-10-CM | POA: Insufficient documentation

## 2017-12-18 DIAGNOSIS — F419 Anxiety disorder, unspecified: Secondary | ICD-10-CM | POA: Insufficient documentation

## 2017-12-23 ENCOUNTER — Encounter: Payer: Self-pay | Admitting: Nurse Practitioner

## 2018-01-12 ENCOUNTER — Other Ambulatory Visit: Payer: Self-pay | Admitting: Nurse Practitioner

## 2018-01-12 DIAGNOSIS — Z1231 Encounter for screening mammogram for malignant neoplasm of breast: Secondary | ICD-10-CM

## 2018-03-17 ENCOUNTER — Ambulatory Visit
Admission: RE | Admit: 2018-03-17 | Discharge: 2018-03-17 | Disposition: A | Discharge status: Home / Self Care | Payer: Medicare Other | Source: Ambulatory Visit | Attending: Nurse Practitioner | Admitting: Nurse Practitioner

## 2018-03-17 DIAGNOSIS — Z1231 Encounter for screening mammogram for malignant neoplasm of breast: Secondary | ICD-10-CM | POA: Diagnosis not present

## 2018-04-28 ENCOUNTER — Telehealth: Payer: Self-pay | Admitting: Nurse Practitioner

## 2018-04-28 NOTE — Telephone Encounter (Signed)
Pt called said that she think she need to up her trazodone to  Twice a day. PT call back # is  [phone]

## 2018-04-29 NOTE — Telephone Encounter (Signed)
If patient's problem is with sleep, I recommend first trying a drug holiday.  This means not taking the medication at all for 5-7 nights. Then, start back at 50 mg once daily.  Twice daily dosing is never used for trazodone due to drowsiness. - Continue good sleep routines, avoiding television and other screens before bed.   - If not improving over next 2 weeks, please return call to clinic for sooner follow-up (OV or virtual visit) to discuss insomnia.  Due for Follow-up March-June.  If patient's problem is related to increasing anxiety, she needs to have virtual visit at next convenience/open schedule.

## 2018-04-29 NOTE — Telephone Encounter (Signed)
Attempted to contact the pt, no answer. LMOM to return my call.  

## 2018-04-30 ENCOUNTER — Other Ambulatory Visit: Payer: Self-pay

## 2018-04-30 DIAGNOSIS — F5104 Psychophysiologic insomnia: Secondary | ICD-10-CM

## 2018-04-30 NOTE — Telephone Encounter (Signed)
The pt was notified. She confirmed that the request for the increase of her Trazodone was because of her insomnia. She states that she will do what is recommended, but is nervous that she want be able to sleep at all during the drug holiday.

## 2018-04-30 NOTE — Telephone Encounter (Signed)
Patient will need another appointment to follow-up with anxiety/insomnia prior to next refill.  She just got a 90-day fill, so she should schedule an appointment in about 2.5 months.

## 2018-05-05 ENCOUNTER — Other Ambulatory Visit: Payer: Self-pay | Admitting: Nurse Practitioner

## 2018-05-05 ENCOUNTER — Telehealth: Payer: Self-pay

## 2018-05-05 DIAGNOSIS — F5104 Psychophysiologic insomnia: Secondary | ICD-10-CM

## 2018-05-05 MED ORDER — TRAZODONE HCL 50 MG PO TABS: 25.0000 mg | ORAL_TABLET | Freq: Every evening | ORAL | 0 refills | Status: DC | PRN

## 2018-05-05 NOTE — Telephone Encounter (Signed)
Attempted to contact the pt back, no answer. LMOM to return my call.

## 2018-05-05 NOTE — Telephone Encounter (Signed)
The pt was notified. No questions or concerns. 

## 2018-05-05 NOTE — Telephone Encounter (Signed)
As discussed at the last telephone advice: If patient's problem is with sleep, I recommend first trying a drug holiday.  This means not taking the medication at all for 5-7 nights. Then, start back at 50 mg once daily.  - Continue good sleep routines, avoiding television and other screens before bed.   - If not improving over next 2 weeks, please return call to clinic for sooner follow-up (OV or virtual visit) to discuss insomnia.  Due for Follow-up March-June.  **We may need to establish a telephone insomnia followup to discuss this in detail if she still has questions.

## 2018-05-05 NOTE — Telephone Encounter (Signed)
The pt called to check on the status of her trazodone refills. She also states that she is trying to do the drug holiday with only taking 25 MG of Trazodone, but  If she wakes up during the night she will take the other 25MG . I express to the patient that a drug holiday is stopping the Trazodone altogether for 5-7 days. She verbalize understanding, but would like Laurens recommendation on how she is taking the medication now.

## 2018-07-11 ENCOUNTER — Other Ambulatory Visit: Payer: Self-pay | Admitting: Nurse Practitioner

## 2018-07-11 DIAGNOSIS — F5104 Psychophysiologic insomnia: Secondary | ICD-10-CM

## 2018-09-28 ENCOUNTER — Other Ambulatory Visit: Payer: Self-pay | Admitting: Family Medicine

## 2018-09-28 DIAGNOSIS — F5104 Psychophysiologic insomnia: Secondary | ICD-10-CM

## 2018-10-25 ENCOUNTER — Other Ambulatory Visit: Payer: Self-pay | Admitting: Nurse Practitioner

## 2018-10-25 DIAGNOSIS — F5104 Psychophysiologic insomnia: Secondary | ICD-10-CM

## 2018-10-29 ENCOUNTER — Other Ambulatory Visit: Payer: Self-pay

## 2018-10-29 ENCOUNTER — Ambulatory Visit (INDEPENDENT_AMBULATORY_CARE_PROVIDER_SITE_OTHER): Payer: Medicare Other | Admitting: Nurse Practitioner

## 2018-10-29 ENCOUNTER — Encounter: Payer: Self-pay | Admitting: Nurse Practitioner

## 2018-10-29 DIAGNOSIS — B001 Herpesviral vesicular dermatitis: Secondary | ICD-10-CM | POA: Diagnosis not present

## 2018-10-29 DIAGNOSIS — F5104 Psychophysiologic insomnia: Secondary | ICD-10-CM | POA: Diagnosis not present

## 2018-10-29 MED ORDER — VALACYCLOVIR HCL 1 G PO TABS: ORAL_TABLET | ORAL | 1 refills | Status: DC

## 2018-10-29 MED ORDER — TRAZODONE HCL 50 MG PO TABS: 50.0000 mg | ORAL_TABLET | Freq: Every evening | ORAL | 4 refills | Status: DC | PRN

## 2018-10-29 NOTE — Progress Notes (Signed)
Telemedicine Encounter: Disclosed to patient at start of encounter that we will provide appropriate telemedicine services.  Patient consents to be treated via phone prior to discussion. - Patient is at her home and is accessed via telephone. - Services are provided by Cassell Smiles from [facility].   Subjective:    Patient ID: Tracie Reed, female    DOB: 1945/04/07, 73 y.o.   MRN: [id number]  Tracie Reed is a 73 y.o. female presenting on 10/29/2018 for Anxiety  HPI Anxiety/Insomnia New trazodone last year.  Patient is taking 1 whole tablet at bedtime for sleep.   No difficulty falling asleep.  Patient wakes in middle of night and has no trouble falling back to sleep.  Patient does occasionally wake earlier than desired - wakes at daylight like always.  - Patient is getting about 6-7 hours most nights.  Currently satisfied with quality and feels rested when waking up.   Cold sores Has these only occasionally 1-2 per year, feels prodrome of tingling and starts medication and doesn't develop sore.  Requests refill of medication today.  GAD 7 : Generalized Anxiety Score 11/11/2017  Nervous, Anxious, on Edge 3  Control/stop worrying 3  Worry too much - different things 3  Trouble relaxing 3  Restless 2  Easily annoyed or irritable 3  Afraid - awful might happen 3  Total GAD 7 Score 20  Anxiety Difficulty Somewhat difficult    Social History   Tobacco Use  . Smoking status: Former Smoker    Quit date: 11/12/1987    Years since quitting: 30.9  . Smokeless tobacco: Never Used  Substance Use Topics  . Alcohol use: Yes    Alcohol/week: 5.0 standard drinks    Types: 5 Glasses of wine per week    Comment: weekly  . Drug use: Not Currently    Comment: past marijuana - very remote use    Review of Systems Per HPI unless specifically indicated above     Objective:    There were no vitals taken for this visit.  Wt Readings from Last 3 Encounters:   12/15/17 126 lb (57.2 kg)  12/15/17 126 lb 3.2 oz (57.2 kg)  11/11/17 124 lb (56.2 kg)    Physical Exam Patient remotely monitored.  Verbal communication appropriate.  Cognition normal.   Results for orders placed or performed in visit on 12/15/17  Hemoglobin A1c  Result Value Ref Range   Hgb A1c MFr Bld 5.2 <5.7 % of total Hgb   Mean Plasma Glucose 103 (calc)   eAG (mmol/L) 5.7 (calc)  COMPLETE METABOLIC PANEL WITH GFR  Result Value Ref Range   Glucose, Bld 74 65 - 99 mg/dL   BUN 9 7 - 25 mg/dL   Creat 0.78 0.60 - 0.93 mg/dL   GFR, Est Non African American 76 > OR = 60 mL/min/1.23m2   GFR, Est African American 88 > OR = 60 mL/min/1.19m2   BUN/Creatinine Ratio NOT APPLICABLE 6 - 22 (calc)   Sodium 140 135 - 146 mmol/L   Potassium 3.9 3.5 - 5.3 mmol/L   Chloride 99 98 - 110 mmol/L   CO2 23 20 - 32 mmol/L   Calcium 9.6 8.6 - 10.4 mg/dL   Total Protein 6.8 6.1 - 8.1 g/dL   Albumin 4.7 3.6 - 5.1 g/dL   Globulin 2.1 1.9 - 3.7 g/dL (calc)   AG Ratio 2.2 1.0 - 2.5 (calc)   Total Bilirubin 0.6 0.2 - 1.2 mg/dL   Alkaline  phosphatase (APISO) 46 33 - 130 U/L   AST 44 (H) 10 - 35 U/L   ALT 29 6 - 29 U/L  CBC with Differential/Platelet  Result Value Ref Range   WBC 7.8 3.8 - 10.8 Thousand/uL   RBC 4.46 3.80 - 5.10 Million/uL   Hemoglobin 14.4 11.7 - 15.5 g/dL   HCT 41.1 35.0 - 45.0 %   MCV 92.2 80.0 - 100.0 fL   MCH 32.3 27.0 - 33.0 pg   MCHC 35.0 32.0 - 36.0 g/dL   RDW 13.2 11.0 - 15.0 %   Platelets 238 140 - 400 Thousand/uL   MPV 11.0 7.5 - 12.5 fL   Neutro Abs 5,585 1,500 - 7,800 cells/uL   Lymphs Abs 1,459 850 - 3,900 cells/uL   WBC mixed population 647 200 - 950 cells/uL   Eosinophils Absolute 39 15 - 500 cells/uL   Basophils Absolute 70 0 - 200 cells/uL   Neutrophils Relative % 71.6 %   Total Lymphocyte 18.7 %   Monocytes Relative 8.3 %   Eosinophils Relative 0.5 %   Basophils Relative 0.9 %  Lipid panel  Result Value Ref Range   Cholesterol 263 (H) <200 mg/dL    HDL 117 >50 mg/dL   Triglycerides 58 <150 mg/dL   LDL Cholesterol (Calc) 132 (H) mg/dL (calc)   Total CHOL/HDL Ratio 2.2 <5.0 (calc)   Non-HDL Cholesterol (Calc) 146 (H) <130 mg/dL (calc)      Assessment & Plan:   Problem List Items Addressed This Visit      Digestive   Recurrent cold sores Stable, rare through year.  Responds to valacyclovir and often does not develop sore past prodrome when using. - Refill provided - Follow-up 1 year.   Relevant Medications   valACYclovir (VALTREX) 1000 MG tablet     Other   Psychophysiological insomnia Controlled.  Improved with trazodone.  Also improved with drug holiday when trazodone became less effective.  Patient tolerating trazodone well without side effects.  Refill med for 1 year.  Continue drug holiday as needed.  Consider skipping 1-2 nights per week or 3-4 nights per month to keep drug effect on lower dose.  Follow-up 1 year.   Relevant Medications   traZODone (DESYREL) 50 MG tablet      Meds ordered this encounter  Medications  . traZODone (DESYREL) 50 MG tablet    Sig: Take 1 tablet (50 mg total) by mouth at bedtime as needed for sleep.    Dispense:  90 tablet    Refill:  4    Order Specific Question:   Supervising Provider    Answer:   Olin Hauser [[id number]]  . valACYclovir (VALTREX) 1000 MG tablet    Sig: Take 2 tablets (2,000 mg) at the start of your cold sore.  Take 1 tablet (1,000 mg) daily until sores have resolved.    Dispense:  15 tablet    Refill:  1    Order Specific Question:   Supervising Provider    Answer:   Olin Hauser [[id number]]    - Time spent in direct consultation with patient via telemedicine about above concerns: 8 minutes  Follow up plan: Return in about 1 year (around 10/29/2019) for insomnia AND as needed.  Cassell Smiles, DNP, AGPCNP-BC Adult Gerontology Primary Care Nurse Practitioner [facility] 10/29/2018, 11:14 AM

## 2018-11-01 ENCOUNTER — Telehealth: Payer: Self-pay | Admitting: Nurse Practitioner

## 2018-11-01 NOTE — Telephone Encounter (Signed)
Pt  Is requesting a call back  She  Tracie Reed that she have questions about her  Medication.  She  reqeusted  [name] to  Call he   Back  [phone]

## 2018-11-01 NOTE — Telephone Encounter (Signed)
The pt called to f/u from her recent appt. She said that you discuss the difference between sleep vs anxiety medication. She said after thinking it over she feel like her symptoms might be more anxiety. She state that she do have problems with staying a sleep and going back when she wake up. She contribute this to her meditating on her job. 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

## 2018-11-01 NOTE — Telephone Encounter (Signed)
Patient is interested in adding anxiety medication.  Patient is getting some sleep, getting meditation/mindfulness.  Now irritability is more pronounced.  Property management gets overwhelming at times.  Patient wakes up with mind racing - often a to-do list. Patient is now noting what this is doing to her body - awareness of physical symptoms (HR increased, stomach in knots, RR up).  These things pass, but never stops.  Patient offered to start Lexapro or Buspirone.  Patient desires to read about these medications first.  Patient will call back for prescription if desiring.

## 2018-11-04 ENCOUNTER — Telehealth: Payer: Self-pay | Admitting: Nurse Practitioner

## 2018-11-04 DIAGNOSIS — F5104 Psychophysiologic insomnia: Secondary | ICD-10-CM

## 2018-11-04 DIAGNOSIS — F419 Anxiety disorder, unspecified: Secondary | ICD-10-CM

## 2018-11-04 MED ORDER — ESCITALOPRAM OXALATE 10 MG PO TABS: 10.0000 mg | ORAL_TABLET | Freq: Every day | ORAL | 5 refills | Status: DC

## 2018-11-04 NOTE — Telephone Encounter (Signed)
Pt is requesting a call back about medication that you and she have talked about.

## 2018-11-04 NOTE — Telephone Encounter (Signed)
Patient has decided to start escitalopram 10 mg tablet as discussed at visit on 10/29/2018.  - Instructions given to take 1/2 tab for 6-8 days.  Then take whole tablet daily and continue.

## 2018-12-21 ENCOUNTER — Ambulatory Visit (INDEPENDENT_AMBULATORY_CARE_PROVIDER_SITE_OTHER): Payer: Medicare Other

## 2018-12-21 VITALS — BP 131/79 | HR 87 | Ht 67.0 in | Wt 121.0 lb

## 2018-12-21 DIAGNOSIS — Z0000 Encounter for general adult medical examination without abnormal findings: Secondary | ICD-10-CM

## 2018-12-21 NOTE — Progress Notes (Signed)
Subjective:   Tracie Reed is a 73 y.o. female who presents for Medicare Annual (Subsequent) preventive examination.  This visit is being conducted via phone call  - after an attempt to do on video chat - due to the COVID-19 pandemic. This patient has given me verbal consent via phone to conduct this visit, patient states they are participating from their home address. Some vital signs may be absent or patient reported.   Patient identification: identified by name, DOB, and current address.    Review of Systems:   Cardiac Risk Factors include: advanced age (>73men, >71 women)     Objective:     Vitals: BP 131/79   Pulse 87   Ht 5\' 7"  (1.702 m)   Wt 121 lb (54.9 kg)   BMI 18.95 kg/m   Body mass index is 18.95 kg/m.  Advanced Directives 12/21/2018 12/15/2017  Does Patient Have a Medical Advance Directive? Yes Yes  Type of Advance Directive Living will;Healthcare Power of [place];Living will  Copy of [place] in Chart? No - copy requested No - copy requested    Tobacco Social History   Tobacco Use  Smoking Status Former Smoker  . Quit date: 11/12/1987  . Years since quitting: 31.1  Smokeless Tobacco Never Used     Counseling given: Not Answered   Clinical Intake:  Pre-visit preparation completed: Yes  Pain : No/denies pain     Nutritional Risks: None Diabetes: No  How often do you need to have someone help you when you read instructions, pamphlets, or other written materials from your doctor or pharmacy?: 1 - Never  Interpreter Needed?: No  Information entered by :: Tiffany Hill,LPN  Past Medical History:  Diagnosis Date  . Anemia    Past Surgical History:  Procedure Laterality Date  . BREAST EXCISIONAL BIOPSY Right 1993   neg  . BUNIONECTOMY Bilateral    Family History  Problem Relation Age of Onset  . Depression Mother   . Healthy Brother   . Breast cancer Neg Hx    Social History    Socioeconomic History  . Marital status: Significant Other    Spouse name: Not on file  . Number of children: 2  . Years of education: Not on file  . Highest education level: Bachelor's degree (e.g., BA, AB, BS)  Occupational History  . Occupation: self employed    Comment: Holiday representative  Social Needs  . Financial resource strain: Not hard at all  . Food insecurity    Worry: Never true    Inability: Never true  . Transportation needs    Medical: No    Non-medical: No  Tobacco Use  . Smoking status: Former Smoker    Quit date: 11/12/1987    Years since quitting: 31.1  . Smokeless tobacco: Never Used  Substance and Sexual Activity  . Alcohol use: Yes    Alcohol/week: 5.0 standard drinks    Types: 5 Glasses of wine per week    Comment: weekly  . Drug use: Not Currently    Comment: past marijuana - very remote use  . Sexual activity: Yes  Lifestyle  . Physical activity    Days per week: 7 days    Minutes per session: 30 min  . Stress: Not at all  Relationships  . Social connections    Talks on phone: More than three times a week    Gets together: More than three times  a week    Attends religious service: More than 4 times per year    Active member of club or organization: No    Attends meetings of clubs or organizations: Never    Relationship status: Patient refused  Other Topics Concern  . Not on file  Social History Narrative  . Not on file    Outpatient Encounter Medications as of 12/21/2018  Medication Sig  . escitalopram (LEXAPRO) 10 MG tablet Take 1 tablet (10 mg total) by mouth daily.  . traZODone (DESYREL) 50 MG tablet Take 1 tablet (50 mg total) by mouth at bedtime as needed for sleep.  Marland Kitchen acyclovir ointment (ZOVIRAX) 5 % Apply 1 application topically every 3 (three) hours as needed. (Patient not taking: Reported on 12/21/2018)  . valACYclovir (VALTREX) 1000 MG tablet Take 2 tablets (2,000 mg) at the start of your cold sore.  Take 1  tablet (1,000 mg) daily until sores have resolved. (Patient not taking: Reported on 12/21/2018)   No facility-administered encounter medications on file as of 12/21/2018.     Activities of Daily Living In your present state of health, do you have any difficulty performing the following activities: 12/21/2018  Hearing? N  Comment no hearing aids  Vision? Y  Comment eyeglassses, goes to eye dr in [place]  Difficulty concentrating or making decisions? N  Walking or climbing stairs? N  Dressing or bathing? N  Doing errands, shopping? N  Preparing Food and eating ? N  Using the Toilet? N  In the past six months, have you accidently leaked urine? N  Do you have problems with loss of bowel control? N  Managing your Medications? N  Managing your Finances? N  Housekeeping or managing your Housekeeping? N  Some recent data might be hidden    Patient Care Team: Mikey College, NP (Inactive) as PCP - General (Nurse Practitioner)    Assessment:   This is a routine wellness examination for [facility].  Exercise Activities and Dietary recommendations Current Exercise Habits: The patient does not participate in regular exercise at present;Home exercise routine, Type of exercise: walking;strength training/weights, Time (Minutes): 30, Frequency (Times/Week): 7, Weekly Exercise (Minutes/Week): 210, Intensity: Mild, Exercise limited by: None identified  Goals    . DIET - INCREASE WATER INTAKE     Recommend to drink at least 6-8 8oz glasses of water per day.        Fall Risk: Fall Risk  12/21/2018 12/15/2017  Falls in the past year? 0 1  Number falls in past yr: 0 0  Injury with Fall? 0 1  Comment - cut on face  Risk for fall due to : - Other (Comment)  Risk for fall due to: Comment - Had been drinking alcohol and was under the influence.     FALL RISK PREVENTION PERTAINING TO THE HOME:  Any stairs in or around the home? yes If so, are there any without handrails? No   Home  free of loose throw rugs in walkways, pet beds, electrical cords, etc? Yes  Adequate lighting in your home to reduce risk of falls? Yes   ASSISTIVE DEVICES UTILIZED TO PREVENT FALLS:  Life alert? No  Use of a cane, walker or w/c? Yes  walking stick when walking  Grab bars in the bathroom? No  Shower chair or bench in shower? Yes  Elevated toilet seat or a handicapped toilet? No   DME ORDERS:  DME order needed?  No   TIMED UP AND GO:  Unable to perform   Depression Screen PHQ 2/9 Scores 12/21/2018 10/29/2018 12/15/2017 11/11/2017  PHQ - 2 Score 0 0 1 0  PHQ- 9 Score - 0 - 5     Cognitive Function        Immunization History  Administered Date(s) Administered  . Influenza, High Dose Seasonal PF 11/11/2017    Qualifies for Shingles Vaccine? Yes  Zostavax completed n/a. Due for Shingrix. Education has been provided regarding the importance of this vaccine. Pt has been advised to call insurance company to determine out of pocket expense. Advised may also receive vaccine at local pharmacy or Health Dept. Verbalized acceptance and understanding.  Tdap: up to date .  Flu Vaccine: due now, patient requested to get while sitting in her car. Verified with office and this was okay. She will call back and schedule an appt.   Pneumococcal Vaccine: Due for Pneumococcal vaccine.   Screening Tests Health Maintenance  Topic Date Due  . DEXA SCAN  07/21/2010  . PNA vac Low Risk Adult (1 of 2 - PCV13) 07/21/2010  . INFLUENZA VACCINE  08/28/2018  . MAMMOGRAM  03/17/2020  . COLONOSCOPY  10/07/2020  . TETANUS/TDAP  12/16/2022  . Hepatitis C Screening  Completed    Cancer Screenings:  Colorectal Screening: Completed 2012. Repeat every 10 years  Mammogram: Completed 03/17/2018. Repeat every year  Bone Density: due now, will order next time mammogram is completed   Lung Cancer Screening: (Low Dose CT Chest recommended if Age 76-80 years, 30 pack-year currently smoking OR have quit  w/in 15years.) does not qualify.     Additional Screening:  Hepatitis C Screening: does qualify; Completed 2017  Vision Screening: Recommended annual ophthalmology exams for early detection of glaucoma and other disorders of the eye. Is the patient up to date with their annual eye exam?  Yes  Who is the provider or what is the name of the office in which the pt attends annual eye exams?  In [place] Screening: Recommended annual dental exams for proper oral hygiene  Community Resource Referral:  CRR required this visit?  No       Plan:  I have personally reviewed and addressed the Medicare Annual Wellness questionnaire and have noted the following in the patient's chart:  A. Medical and social history B. Use of alcohol, tobacco or illicit drugs  C. Current medications and supplements D. Functional ability and status E.  Nutritional status F.  Physical activity G. Advance directives H. List of other physicians I.  Hospitalizations, surgeries, and ER visits in previous 12 months J.  [place] such as hearing and vision if needed, cognitive and depression L. Referrals and appointments   In addition, I have reviewed and discussed with patient certain preventive protocols, quality metrics, and best practice recommendations. A written personalized care plan for preventive services as well as general preventive health recommendations were provided to patient.  Signed,    Bevelyn Ngo, LPN  [id number] Nurse Health Advisor   Nurse Notes: none

## 2019-01-11 ENCOUNTER — Ambulatory Visit (INDEPENDENT_AMBULATORY_CARE_PROVIDER_SITE_OTHER): Payer: Medicare Other

## 2019-01-11 ENCOUNTER — Other Ambulatory Visit: Payer: Self-pay

## 2019-01-11 DIAGNOSIS — Z23 Encounter for immunization: Secondary | ICD-10-CM | POA: Diagnosis not present

## 2019-04-11 ENCOUNTER — Other Ambulatory Visit: Payer: Self-pay

## 2019-04-11 DIAGNOSIS — F5104 Psychophysiologic insomnia: Secondary | ICD-10-CM

## 2019-04-11 DIAGNOSIS — F419 Anxiety disorder, unspecified: Secondary | ICD-10-CM

## 2019-04-11 MED ORDER — ESCITALOPRAM OXALATE 10 MG PO TABS: 10.0000 mg | ORAL_TABLET | Freq: Every day | ORAL | 0 refills | Status: DC

## 2019-04-11 NOTE — Telephone Encounter (Addendum)
The patient wish to postpone appt until after she get her COVID vaccines. I gave her the information to schedule the appointment . She will call and try to schedule right away and give [place] a call back after she finish her vaccines.

## 2019-05-06 ENCOUNTER — Other Ambulatory Visit: Payer: Self-pay | Admitting: Family Medicine

## 2019-05-06 DIAGNOSIS — F419 Anxiety disorder, unspecified: Secondary | ICD-10-CM

## 2019-05-06 DIAGNOSIS — F5104 Psychophysiologic insomnia: Secondary | ICD-10-CM

## 2019-07-05 ENCOUNTER — Ambulatory Visit (INDEPENDENT_AMBULATORY_CARE_PROVIDER_SITE_OTHER): Payer: Medicare Other | Admitting: Family Medicine

## 2019-07-05 ENCOUNTER — Other Ambulatory Visit: Payer: Self-pay

## 2019-07-05 ENCOUNTER — Encounter: Payer: Self-pay | Admitting: Family Medicine

## 2019-07-05 VITALS — BP 125/75 | HR 83 | Temp 97.3°F | Ht 67.0 in | Wt 127.0 lb

## 2019-07-05 DIAGNOSIS — R634 Abnormal weight loss: Secondary | ICD-10-CM

## 2019-07-05 DIAGNOSIS — Z13820 Encounter for screening for osteoporosis: Secondary | ICD-10-CM

## 2019-07-05 DIAGNOSIS — Z79899 Other long term (current) drug therapy: Secondary | ICD-10-CM

## 2019-07-05 DIAGNOSIS — F5104 Psychophysiologic insomnia: Secondary | ICD-10-CM

## 2019-07-05 DIAGNOSIS — R7303 Prediabetes: Secondary | ICD-10-CM | POA: Diagnosis not present

## 2019-07-05 DIAGNOSIS — Z0000 Encounter for general adult medical examination without abnormal findings: Secondary | ICD-10-CM

## 2019-07-05 DIAGNOSIS — Z1231 Encounter for screening mammogram for malignant neoplasm of breast: Secondary | ICD-10-CM

## 2019-07-05 DIAGNOSIS — F419 Anxiety disorder, unspecified: Secondary | ICD-10-CM | POA: Diagnosis not present

## 2019-07-05 DIAGNOSIS — R7309 Other abnormal glucose: Secondary | ICD-10-CM | POA: Diagnosis not present

## 2019-07-05 LAB — POCT URINALYSIS DIPSTICK
Bilirubin, UA: NEGATIVE
Blood, UA: NEGATIVE
Glucose, UA: NEGATIVE
Ketones, UA: NEGATIVE
Leukocytes, UA: NEGATIVE
Nitrite, UA: NEGATIVE
Protein, UA: NEGATIVE
Spec Grav, UA: <=1.005 — AB (ref 1.010–1.025)
Urobilinogen, UA: 0.2 E.U./dL
pH, UA: 7 (ref 5.0–8.0)

## 2019-07-05 MED ORDER — BUSPIRONE HCL 5 MG PO TABS: 5.0000 mg | ORAL_TABLET | Freq: Three times a day (TID) | ORAL | 1 refills | Status: DC

## 2019-07-05 NOTE — Progress Notes (Signed)
Subjective:    Patient ID: Tracie Reed, female    DOB: April 14, 1945, 74 y.o.   MRN: [id number]  Tracie Reed is a 74 y.o. female presenting on 07/05/2019 for Anxiety (pt admits she has been taking the only a half of the Lexapro due to not been able to get a refill for the last month .)   HPI  Tracie Reed presents to clinic for follow up on her anxiety.  Reports over the past month she has used 5mg  of escitalopram daily to make her prescription last through our visit today.  Reports prior to beginning a dose reduction had found that she was going to be requesting either to increase her medication or change it, as was not assisting with her anxiety symptoms as she has needed.    Depression screen [facility] 2/9 07/05/2019 12/21/2018 10/29/2018  Decreased Interest 0 0 0  Down, Depressed, Hopeless 0 0 0  PHQ - 2 Score 0 0 0  Altered sleeping 1 - 0  Tired, decreased energy 0 - 0  Change in appetite 0 - 0  Feeling bad or failure about yourself  0 - 0  Trouble concentrating 0 - 0  Moving slowly or fidgety/restless 0 - 0  Suicidal thoughts 0 - 0  PHQ-9 Score 1 - 0  Difficult doing work/chores Not difficult at all - Not difficult at all    Social History   Tobacco Use  . Smoking status: Former Smoker    Quit date: 11/12/1987    Years since quitting: 31.6  . Smokeless tobacco: Never Used  Substance Use Topics  . Alcohol use: Yes    Alcohol/week: 5.0 standard drinks    Types: 5 Glasses of wine per week    Comment: weekly  . Drug use: Not Currently    Comment: past marijuana - very remote use    Review of Systems  Constitutional: Negative.   HENT: Negative.   Eyes: Negative.   Respiratory: Negative.   Cardiovascular: Negative.   Gastrointestinal: Negative.   Endocrine: Negative.   Genitourinary: Negative.   Musculoskeletal: Negative.   Skin: Negative.   Allergic/Immunologic: Negative.   Neurological: Negative.   Hematological: Negative.   Psychiatric/Behavioral: Negative for  agitation, behavioral problems, confusion, decreased concentration, dysphoric mood, hallucinations, self-injury, sleep disturbance and suicidal ideas. The patient is nervous/anxious. The patient is not hyperactive.    Per HPI unless specifically indicated above     Objective:    BP 125/75 (BP Location: Right Arm, Patient Position: Sitting, Cuff Size: Normal)   Pulse 83   Temp (!) 97.3 F (36.3 C) (Temporal)   Ht 5\' 7"  (1.702 m)   Wt 127 lb (57.6 kg)   BMI 19.89 kg/m   Wt Readings from Last 3 Encounters:  07/05/19 127 lb (57.6 kg)  12/21/18 121 lb (54.9 kg)  12/15/17 126 lb (57.2 kg)    Physical Exam Vitals reviewed.  Constitutional:      General: She is not in acute distress.    Appearance: Normal appearance. She is well-developed, well-groomed and normal weight. She is not ill-appearing or toxic-appearing.  HENT:     Head: Normocephalic.     Nose:     Comments: Lizbeth Bark is in place, covering mouth and nose  Eyes:     General: Lids are normal. Vision grossly intact.        Right eye: No discharge.        Left eye: No discharge.     Extraocular Movements: Extraocular movements  intact.     Conjunctiva/sclera: Conjunctivae normal.     Pupils: Pupils are equal, round, and reactive to light.  Cardiovascular:     Rate and Rhythm: Normal rate and regular rhythm.     Pulses: Normal pulses.     Heart sounds: Normal heart sounds. No murmur. No friction rub. No gallop.   Pulmonary:     Effort: Pulmonary effort is normal. No respiratory distress.     Breath sounds: Normal breath sounds.  Musculoskeletal:     Right lower leg: No edema.     Left lower leg: No edema.  Skin:    General: Skin is warm and dry.     Capillary Refill: Capillary refill takes less than 2 seconds.  Neurological:     General: No focal deficit present.     Mental Status: She is alert and oriented to person, place, and time.     Cranial Nerves: No cranial nerve deficit.     Sensory: No sensory deficit.      Motor: No weakness.     Coordination: Coordination normal.     Gait: Gait normal.  Psychiatric:        Attention and Perception: Attention and perception normal.        Mood and Affect: Affect normal. Mood is anxious.        Speech: Speech normal.        Behavior: Behavior normal. Behavior is cooperative.        Thought Content: Thought content normal.        Cognition and Memory: Cognition and memory normal.        Judgment: Judgment normal.    Results for orders placed or performed in visit on 07/05/19  POCT Urinalysis Dipstick  Result Value Ref Range   Color, UA Yellow    Clarity, UA clear    Glucose, UA Negative Negative   Bilirubin, UA negative    Ketones, UA negative    Spec Grav, UA <=1.005 (A) 1.010 - 1.025   Blood, UA negative    pH, UA 7.0 5.0 - 8.0   Protein, UA Negative Negative   Urobilinogen, UA 0.2 0.2 or 1.0 E.U./dL   Nitrite, UA negative    Leukocytes, UA Negative Negative   Appearance     Odor        Assessment & Plan:   Problem List Items Addressed This Visit      Other   Anxiety    Has weaned off of her escitalopram over the past month, not taking any medication for the last 2 days.  Believes the escitalopram was not helping as she would have liked to for her anxiety.  Discussed options of sertraline, fluoxetine and buspar for assistance with anxiety.  Patient agreeable to trying buspar, as reports taking sertraline and fluoxetine many years ago with unknown results but believes it may not have worked.  Plan: 1. Begin buspar 5mg , taking 1 tablet up to 3x per day as needed for anxiety. 2. Follow up in 2 weeks for re-evaluation      Relevant Medications   busPIRone (BUSPAR) 5 MG tablet   Psychophysiological insomnia    Other Visit Diagnoses    Prediabetes    -  Primary   Relevant Orders   POCT Urinalysis Dipstick (Completed)   Routine medical exam       Relevant Orders   CBC with Differential   COMPLETE METABOLIC PANEL WITH GFR   Long-term  use of high-risk medication  Relevant Orders   Lipid Profile   Thyroid Panel With TSH   Weight loss       Relevant Orders   Thyroid Panel With TSH   Encounter for screening mammogram for malignant neoplasm of breast       Relevant Orders   MM 3D SCREEN BREAST BILATERAL   Screening for osteoporosis       Relevant Orders   DG Bone Density      Meds ordered this encounter  Medications  . busPIRone (BUSPAR) 5 MG tablet    Sig: Take 1 tablet (5 mg total) by mouth 3 (three) times daily.    Dispense:  90 tablet    Refill:  1      Follow up plan: Return in about 2 weeks (around 07/19/2019) for CPE.   Harlin Rain, [place] [facility] Nurse Practitioner [facility] 07/05/2019, 11:10 AM

## 2019-07-05 NOTE — Assessment & Plan Note (Signed)
Has weaned off of her escitalopram over the past month, not taking any medication for the last 2 days.  Believes the escitalopram was not helping as she would have liked to for her anxiety.  Discussed options of sertraline, fluoxetine and buspar for assistance with anxiety.  Patient agreeable to trying buspar, as reports taking sertraline and fluoxetine many years ago with unknown results but believes it may not have worked.  Plan: 1. Begin buspar 5mg , taking 1 tablet up to 3x per day as needed for anxiety. 2. Follow up in 2 weeks for re-evaluation

## 2019-07-05 NOTE — Patient Instructions (Addendum)
Have your labs completed and we will contact you with the results.  For Mammogram screening for breast cancer and DEXA for bone density  Call the [place] below anytime to schedule your own appointment now that order has been placed.  [facility] [address] [place], [place] [address] Phone: [phone]  [facility] [address] [place], [place] [address] Phone: [phone]  I have sent in a new prescription for Buspar 5mg  to take up to 3x per day as needed for anxiety.  I have discontinued the lexapro since you have reduced dosage over the past month and have been off the medication a few days.  We will plan to see you back in 2 weeks for your physical  You will receive a survey after today's visit either digitally by e-mail or paper by [place] mail. Your experiences and feedback matter to [place].  Please respond so we know how we are doing as we provide care for you.  Call us with any questions/concerns/needs.  It is my goal to be available to you for your health concerns.  Thanks for choosing me to be a partner in your healthcare needs!  Harlin Rain, FNP-C Family Nurse Practitioner [facility] Phone: [phone]

## 2019-07-07 ENCOUNTER — Other Ambulatory Visit: Payer: Self-pay | Admitting: Family Medicine

## 2019-07-07 DIAGNOSIS — B001 Herpesviral vesicular dermatitis: Secondary | ICD-10-CM

## 2019-07-07 LAB — COMPLETE METABOLIC PANEL WITH GFR
AG Ratio: 2.6 (calc) — ABNORMAL HIGH (ref 1.0–2.5)
ALT: 15 U/L (ref 6–29)
AST: 18 U/L (ref 10–35)
Albumin: 4.7 g/dL (ref 3.6–5.1)
Alkaline phosphatase (APISO): 58 U/L (ref 37–153)
BUN: 8 mg/dL (ref 7–25)
CO2: 27 mmol/L (ref 20–32)
Calcium: 9.9 mg/dL (ref 8.6–10.4)
Chloride: 103 mmol/L (ref 98–110)
Creat: 0.67 mg/dL (ref 0.60–0.93)
GFR, Est African American: 101 mL/min/{1.73_m2} (ref >=60)
GFR, Est Non African American: 87 mL/min/{1.73_m2} (ref >=60)
Globulin: 1.8 g/dL (calc) — ABNORMAL LOW (ref 1.9–3.7)
Glucose, Bld: 122 mg/dL — ABNORMAL HIGH (ref 65–99)
Potassium: 4.3 mmol/L (ref 3.5–5.3)
Sodium: 140 mmol/L (ref 135–146)
Total Bilirubin: 0.5 mg/dL (ref 0.2–1.2)
Total Protein: 6.5 g/dL (ref 6.1–8.1)

## 2019-07-07 LAB — CBC WITH DIFFERENTIAL/PLATELET
Absolute Monocytes: 672 cells/uL (ref 200–950)
Basophils Absolute: 63 cells/uL (ref 0–200)
Basophils Relative: 0.9 %
Eosinophils Absolute: 28 cells/uL (ref 15–500)
Eosinophils Relative: 0.4 %
HCT: 44.5 % (ref 35.0–45.0)
Hemoglobin: 15.1 g/dL (ref 11.7–15.5)
Lymphs Abs: 1477 cells/uL (ref 850–3900)
MCH: 31.9 pg (ref 27.0–33.0)
MCHC: 33.9 g/dL (ref 32.0–36.0)
MCV: 93.9 fL (ref 80.0–100.0)
MPV: 10.6 fL (ref 7.5–12.5)
Monocytes Relative: 9.6 %
Neutro Abs: 4760 cells/uL (ref 1500–7800)
Neutrophils Relative %: 68 %
Platelets: 252 10*3/uL (ref 140–400)
RBC: 4.74 10*6/uL (ref 3.80–5.10)
RDW: 13.2 % (ref 11.0–15.0)
Total Lymphocyte: 21.1 %
WBC: 7 10*3/uL (ref 3.8–10.8)

## 2019-07-07 LAB — THYROID PANEL WITH TSH
Free Thyroxine Index: 1.5 (ref 1.4–3.8)
T3 Uptake: 36 % — ABNORMAL HIGH (ref 22–35)
T4, Total: 4.1 ug/dL — ABNORMAL LOW (ref 5.1–11.9)
TSH: 2.91 mIU/L (ref 0.40–4.50)

## 2019-07-07 LAB — LIPID PANEL
Cholesterol: 233 mg/dL — ABNORMAL HIGH (ref <200)
HDL: 106 mg/dL (ref >=50)
LDL Cholesterol (Calc): 114 mg/dL (calc) — ABNORMAL HIGH
Non-HDL Cholesterol (Calc): 127 mg/dL (calc) (ref <130)
Total CHOL/HDL Ratio: 2.2 (calc) (ref <5.0)
Triglycerides: 45 mg/dL (ref <150)

## 2019-07-07 LAB — HEMOGLOBIN A1C W/OUT EAG: Hgb A1c MFr Bld: 5.2 % of total Hgb (ref <5.7)

## 2019-07-07 MED ORDER — VALACYCLOVIR HCL 1 G PO TABS: ORAL_TABLET | ORAL | 1 refills | Status: DC

## 2019-07-07 NOTE — Telephone Encounter (Signed)
Requested Prescriptions  Pending Prescriptions Disp Refills   valACYclovir (VALTREX) 1000 MG tablet 15 tablet 1    Sig: Take 2 tablets (2,000 mg) at the start of your cold sore.  Take 1 tablet (1,000 mg) daily until sores have resolved.     Antimicrobials:  Antiviral Agents - Anti-Herpetic Passed - 07/07/2019  9:31 AM      Passed - Valid encounter within last 12 months    Recent Outpatient Visits          2 days ago Prediabetes   [facility], Lupita Raider, FNP   8 months ago Psychophysiological insomnia   [facility] Mikey College, NP   1 year ago [facility] Merrilyn Puma, Jerrel Ivory, NP   1 year ago Psychophysiological insomnia   [facility] Merrilyn Puma, Jerrel Ivory, NP

## 2019-07-07 NOTE — Telephone Encounter (Signed)
Medication Refill - Medication: valACYclovir (VALTREX) 1000 MG tablet    Preferred Pharmacy (with phone number or street name):  [facility] [place], [place] ([place] [address] Phone:  [phone]  Fax:  [phone]       Agent: Please be advised that RX refills may take up to 3 business days. We ask that you follow-up with your pharmacy.

## 2019-07-12 DIAGNOSIS — H524 Presbyopia: Secondary | ICD-10-CM | POA: Diagnosis not present

## 2019-07-19 ENCOUNTER — Encounter: Payer: Self-pay | Admitting: Family Medicine

## 2019-07-19 ENCOUNTER — Other Ambulatory Visit: Payer: Self-pay

## 2019-07-19 ENCOUNTER — Ambulatory Visit (INDEPENDENT_AMBULATORY_CARE_PROVIDER_SITE_OTHER): Payer: Medicare Other | Admitting: Family Medicine

## 2019-07-19 DIAGNOSIS — F5104 Psychophysiologic insomnia: Secondary | ICD-10-CM

## 2019-07-19 DIAGNOSIS — B001 Herpesviral vesicular dermatitis: Secondary | ICD-10-CM | POA: Diagnosis not present

## 2019-07-19 DIAGNOSIS — Z0000 Encounter for general adult medical examination without abnormal findings: Secondary | ICD-10-CM | POA: Diagnosis not present

## 2019-07-19 DIAGNOSIS — F419 Anxiety disorder, unspecified: Secondary | ICD-10-CM | POA: Diagnosis not present

## 2019-07-19 MED ORDER — VALACYCLOVIR HCL 1 G PO TABS: ORAL_TABLET | ORAL | 1 refills | Status: DC

## 2019-07-19 MED ORDER — TRAZODONE HCL 50 MG PO TABS: 50.0000 mg | ORAL_TABLET | Freq: Every evening | ORAL | 4 refills | Status: DC | PRN

## 2019-07-19 MED ORDER — BUSPIRONE HCL 5 MG PO TABS: 5.0000 mg | ORAL_TABLET | Freq: Three times a day (TID) | ORAL | 1 refills | Status: DC

## 2019-07-19 NOTE — Patient Instructions (Signed)
Well Visit, Ages 66 to 60: Care Instructions Overview  Well visits can help you stay healthy. Your provider has checked your overall health and may have suggested ways to take good care of yourself. Your provider also may have recommended tests. At home, you can help prevent illness with healthy eating, regular exercise, and other steps.  Follow-up care is a key part of your treatment and safety. Be sure to make and go to all appointments, and call your provider if you are having problems. It's also a good idea to know your test results and keep a list of the medicines you take.  How can you care for yourself at home?   Get screening tests that you and your doctor decide on. Screening helps find diseases before any symptoms appear.   Eat healthy foods. Choose fruits, vegetables, whole grains, protein, and low-fat dairy foods. Limit fat, especially saturated fat. Reduce salt in your diet.   Limit alcohol. If you are a man, have no more than 2 drinks a day or 14 drinks a week. If you are a woman, have no more than 1 drink a day or 7 drinks a week.   Get at least 30 minutes of physical activity on most days of the week.  We recommend you go no more than 2 days in a row without exercise. Walking is a good choice. You also may want to do other activities, such as running, swimming, cycling, or playing tennis or team sports. Discuss any changes in your exercise program with your provider.   Reach and stay at a healthy weight. This will lower your risk for many problems, such as obesity, diabetes, heart disease, and high blood pressure.   Do not smoke or allow others to smoke around you. If you need help quitting, talk to your provider about stop-smoking programs and medicines. These can increase your chances of quitting for good.  Can call 1-800-QUIT-NOW (202) 570-0356) for the [facility], assistance with smoking cessation.   Care for your mental health. It is easy to get weighed  down by worry and stress. Learn strategies to manage stress, like deep breathing and mindfulness, and stay connected with your family and community. If you find you often feel sad or hopeless, talk with your provider. Treatment can help.   Talk to your provider about whether you have any risk factors for sexually transmitted infections (STIs). You can help prevent STIs if you wait to have sex with a new partner (or partners) until you've each been tested for STIs. It also helps if you use condoms (female or female condoms) and if you limit your sex partners to one person who only has sex with you. Vaccines are available for some STIs, such as HPV (these are age dependent).   Use birth control if it's important to you to prevent pregnancy. Talk with your provider about the choices available and what might be best for you.   If you think you may have a problem with alcohol or drug use, talk to your provider. This includes prescription medicines (such as amphetamines and opioids) and illegal drugs (such as cocaine and methamphetamine). Your provider can help you figure out what type of treatment is best for you.   If you have concerns about domestic violence or intimate partner violence, there are resources available to you. National Domestic Abuse Hotline 2082484332   Protect your skin from too much sun. When you're outdoors from 10 a.m. to 4 p.m., stay in the  shade or cover up with clothing and a hat with a wide brim. Wear sunglasses that block UV rays. Even when it's cloudy, put broad-spectrum sunscreen (SPF 30 or higher) on any exposed skin.   See a dentist one or two times a year for checkups and to have your teeth cleaned.   See an eye doctor once per year for an eye exam.   Wear a seat belt in the car.  When should you call for help?  Watch closely for changes in your health, and be sure to contact your provider if you have any problems or symptoms that concern you.  We will plan to  see you back in 1 year for next physical and 3 months for anxiety follow up  You will receive a survey after today's visit either digitally by e-mail or paper by [employer]. Your experiences and feedback matter to [place].  Please respond so we know how we are doing as we provide care for you.  Call us with any questions/concerns/needs.  It is my goal to be available to you for your health concerns.  Thanks for choosing me to be a partner in your healthcare needs!  Harlin Rain, FNP-C Family Nurse Practitioner [facility] Phone: [phone]

## 2019-07-19 NOTE — Progress Notes (Signed)
Subjective:    Patient ID: Tracie Reed, female    DOB: Dec 11, 1945, 74 y.o.   MRN: [id number]  Tracie Reed is a 74 y.o. female presenting on 07/19/2019 for Annual Exam  HPI  HEALTH MAINTENANCE:  Weight/BMI: Healthy BMI, 20.14 Physical activity: Stays active Diet: Regular Seatbelt: Yes Sunscreen: Yes Mammogram: Due, has appointment scheduled DEXA: Due, has appointment scheduled Colon cancer screening. Last reported 10/08/2010, due 09/2020 Optometry: Regularly Dentistry: Regularly  IMMUNIZATIONS: Influenza: Due next season Tetanus: Reportedly had 11/2012 COVID: Completed 04/28/2019 & 05/26/2019 Pneumonia: Completed 01/11/2019  STOP BANG SCREENING Snoring: Do you snore loudly? yes/no: No Tired: Do you often feel tired, fatigued, sleeping during the daytime? yes/no: No Observed: Has anyone observed you stop breathing or choking/gasping during your sleep? yes/no: No Pressure: Do you have or being treated for high blood pressure? yes/no: No BMI: Greater than 35? yes/no: No Age: Older than 50? yes/no: Yes Neck Side: Greater than 17 (males)/16 (females)? yes/no: No Gender: Born as female gender? yes/no: No  Depression screen [facility] 2/9 07/05/2019 12/21/2018 10/29/2018  Decreased Interest 0 0 0  Down, Depressed, Hopeless 0 0 0  PHQ - 2 Score 0 0 0  Altered sleeping 1 - 0  Tired, decreased energy 0 - 0  Change in appetite 0 - 0  Feeling bad or failure about yourself  0 - 0  Trouble concentrating 0 - 0  Moving slowly or fidgety/restless 0 - 0  Suicidal thoughts 0 - 0  PHQ-9 Score 1 - 0  Difficult doing work/chores Not difficult at all - Not difficult at all    Past Medical History:  Diagnosis Date  . Anemia    Past Surgical History:  Procedure Laterality Date  . BREAST EXCISIONAL BIOPSY Right 1993   neg  . BUNIONECTOMY Bilateral    Social History   Socioeconomic History  . Marital status: Significant Other    Spouse name: Not on file  . Number of children: 2  .  Years of education: Not on file  . Highest education level: Bachelor's degree (e.g., BA, AB, BS)  Occupational History  . Occupation: self employed    Comment: Holiday representative  Tobacco Use  . Smoking status: Former Smoker    Quit date: 11/12/1987    Years since quitting: 31.7  . Smokeless tobacco: Never Used  Vaping Use  . Vaping Use: Never used  Substance and Sexual Activity  . Alcohol use: Yes    Alcohol/week: 5.0 standard drinks    Types: 5 Glasses of wine per week    Comment: weekly  . Drug use: Not Currently    Comment: past marijuana - very remote use  . Sexual activity: Yes  Other Topics Concern  . Not on file  Social History Narrative  . Not on file   Social Determinants of Health   Financial Resource Strain:   . Difficulty of Paying Living Expenses:   Food Insecurity:   . Worried About Charity fundraiser in the Last Year:   . Arboriculturist in the Last Year:   Transportation Needs:   . Film/video editor (Medical):   Marland Kitchen Lack of Transportation (Non-Medical):   Physical Activity: Sufficiently Active  . Days of Exercise per Week: 7 days  . Minutes of Exercise per Session: 30 min  Stress:   . Feeling of Stress :   Social Connections: Unknown  . Frequency of Communication with Friends and Family: More than three times  a week  . Frequency of Social Gatherings with Friends and Family: More than three times a week  . Attends Religious Services: More than 4 times per year  . Active Member of Clubs or Organizations: No  . Attends Archivist Meetings: Never  . Marital Status: Not on file  Intimate Partner Violence: Not At Risk  . Fear of Current or Ex-Partner: No  . Emotionally Abused: No  . Physically Abused: No  . Sexually Abused: No   Family History  Problem Relation Age of Onset  . Depression Mother   . Healthy Brother   . Breast cancer Neg Hx    Current Outpatient Medications on File Prior to Visit  Medication Sig    . acyclovir ointment (ZOVIRAX) 5 % Apply 1 application topically every 3 (three) hours as needed.   No current facility-administered medications on file prior to visit.    Per HPI unless specifically indicated above     Objective:    BP 137/81 (BP Location: Right Arm, Patient Position: Sitting, Cuff Size: Small)   Pulse 85   Temp (!) 97.3 F (36.3 C) (Temporal)   Resp 16   Ht 5\' 7"  (1.702 m)   Wt 128 lb 9.6 oz (58.3 kg)   SpO2 100%   BMI 20.14 kg/m   Wt Readings from Last 3 Encounters:  07/19/19 128 lb 9.6 oz (58.3 kg)  07/05/19 127 lb (57.6 kg)  12/21/18 121 lb (54.9 kg)    Physical Exam Vitals reviewed.  Constitutional:      General: She is not in acute distress.    Appearance: Normal appearance. She is well-developed, well-groomed and normal weight. She is not ill-appearing or toxic-appearing.  HENT:     Head: Normocephalic.     Right Ear: Tympanic membrane, ear canal and external ear normal. There is no impacted cerumen.     Left Ear: Tympanic membrane, ear canal and external ear normal. There is no impacted cerumen.     Nose: Nose normal. No congestion or rhinorrhea.     Mouth/Throat:     Lips: Pink.     Mouth: Mucous membranes are moist.     Pharynx: Oropharynx is clear. Uvula midline. No oropharyngeal exudate or posterior oropharyngeal erythema.  Eyes:     General: Lids are normal. Vision grossly intact. No scleral icterus.       Right eye: No discharge.        Left eye: No discharge.     Extraocular Movements: Extraocular movements intact.     Conjunctiva/sclera: Conjunctivae normal.     Pupils: Pupils are equal, round, and reactive to light.  Neck:     Thyroid: No thyroid mass, thyromegaly or thyroid tenderness.  Cardiovascular:     Rate and Rhythm: Normal rate and regular rhythm.     Pulses: Normal pulses.          Dorsalis pedis pulses are 2+ on the right side and 2+ on the left side.     Heart sounds: Normal heart sounds. No murmur heard.  No  friction rub. No gallop.   Pulmonary:     Effort: Pulmonary effort is normal. No respiratory distress.     Breath sounds: Normal breath sounds.  Chest:     Comments: Deferred Abdominal:     General: Abdomen is flat. Bowel sounds are normal. There is no distension.     Palpations: Abdomen is soft. There is no hepatomegaly, splenomegaly or mass.     Tenderness: There  is no abdominal tenderness. There is no guarding or rebound.     Hernia: No hernia is present.  Musculoskeletal:        General: Normal range of motion.     Cervical back: Normal range of motion and neck supple. No tenderness.     Right lower leg: No edema.     Left lower leg: No edema.     Comments: Normal tone, 5/5 strength BUE & BLE  Feet:     Right foot:     Skin integrity: Skin integrity normal.     Left foot:     Skin integrity: Skin integrity normal.  Lymphadenopathy:     Cervical: No cervical adenopathy.  Skin:    General: Skin is warm and dry.     Capillary Refill: Capillary refill takes less than 2 seconds.  Neurological:     General: No focal deficit present.     Mental Status: She is alert and oriented to person, place, and time.     Cranial Nerves: No cranial nerve deficit.     Sensory: No sensory deficit.     Motor: No weakness.     Coordination: Coordination normal.     Gait: Gait normal.     Deep Tendon Reflexes: Reflexes normal.  Psychiatric:        Attention and Perception: Attention and perception normal.        Mood and Affect: Mood and affect normal.        Speech: Speech normal.        Behavior: Behavior normal. Behavior is cooperative.        Thought Content: Thought content normal.        Cognition and Memory: Cognition and memory normal.        Judgment: Judgment normal.     Results for orders placed or performed in visit on 07/05/19  CBC with Differential  Result Value Ref Range   WBC 7.0 3.8 - 10.8 Thousand/uL   RBC 4.74 3.80 - 5.10 Million/uL   Hemoglobin 15.1 11.7 - 15.5 g/dL    HCT 44.5 35 - 45 %   MCV 93.9 80.0 - 100.0 fL   MCH 31.9 27.0 - 33.0 pg   MCHC 33.9 32.0 - 36.0 g/dL   RDW 13.2 11.0 - 15.0 %   Platelets 252 140 - 400 Thousand/uL   MPV 10.6 7.5 - 12.5 fL   Neutro Abs 4,760 1,500 - 7,800 cells/uL   Lymphs Abs 1,477 850 - 3,900 cells/uL   Absolute Monocytes 672 200 - 950 cells/uL   Eosinophils Absolute 28 15 - 500 cells/uL   Basophils Absolute 63 0 - 200 cells/uL   Neutrophils Relative % 68 %   Total Lymphocyte 21.1 %   Monocytes Relative 9.6 %   Eosinophils Relative 0.4 %   Basophils Relative 0.9 %  COMPLETE METABOLIC PANEL WITH GFR  Result Value Ref Range   Glucose, Bld 122 (H) 65 - 99 mg/dL   BUN 8 7 - 25 mg/dL   Creat 0.67 0.60 - 0.93 mg/dL   GFR, Est Non African American 87 > OR = 60 mL/min/1.57m2   GFR, Est African American 101 > OR = 60 mL/min/1.88m2   BUN/Creatinine Ratio NOT APPLICABLE 6 - 22 (calc)   Sodium 140 135 - 146 mmol/L   Potassium 4.3 3.5 - 5.3 mmol/L   Chloride 103 98 - 110 mmol/L   CO2 27 20 - 32 mmol/L   Calcium 9.9 8.6 - 10.4 mg/dL  Total Protein 6.5 6.1 - 8.1 g/dL   Albumin 4.7 3.6 - 5.1 g/dL   Globulin 1.8 (L) 1.9 - 3.7 g/dL (calc)   AG Ratio 2.6 (H) 1.0 - 2.5 (calc)   Total Bilirubin 0.5 0.2 - 1.2 mg/dL   Alkaline phosphatase (APISO) 58 37 - 153 U/L   AST 18 10 - 35 U/L   ALT 15 6 - 29 U/L  Lipid Profile  Result Value Ref Range   Cholesterol 233 (H) <200 mg/dL   HDL 106 > OR = 50 mg/dL   Triglycerides 45 <150 mg/dL   LDL Cholesterol (Calc) 114 (H) mg/dL (calc)   Total CHOL/HDL Ratio 2.2 <5.0 (calc)   Non-HDL Cholesterol (Calc) 127 <130 mg/dL (calc)  Thyroid Panel With TSH  Result Value Ref Range   T3 Uptake 36 (H) 22 - 35 %   T4, Total 4.1 (L) 5.1 - 11.9 mcg/dL   Free Thyroxine Index 1.5 1.4 - 3.8   TSH 2.91 0.40 - 4.50 mIU/L  Hemoglobin A1C w/out eAG  Result Value Ref Range   Hgb A1c MFr Bld 5.2 <5.7 % of total Hgb  POCT Urinalysis Dipstick  Result Value Ref Range   Color, UA Yellow    Clarity,  UA clear    Glucose, UA Negative Negative   Bilirubin, UA negative    Ketones, UA negative    Spec Grav, UA <=1.005 (A) 1.010 - 1.025   Blood, UA negative    pH, UA 7.0 5.0 - 8.0   Protein, UA Negative Negative   Urobilinogen, UA 0.2 0.2 or 1.0 E.U./dL   Nitrite, UA negative    Leukocytes, UA Negative Negative   Appearance     Odor        Assessment & Plan:   Problem List Items Addressed This Visit      Digestive   Recurrent cold sores   Relevant Medications   valACYclovir (VALTREX) 1000 MG tablet     Other   Anxiety    Stable and well controlled with Buspar 5mg  TID PRN.  Has found she has been taking once daily with good relief of symptoms.  Discussed can take once per day but can increase if needed.  If needing more than 3x per day, will be able to discuss dose increase so not needing to take as many tablets.  Plan: 1. Continue Buspar 5mg  TID PRN 2. Mood handout given at last visit, to review 3. Follow up in 3 months      Relevant Medications   busPIRone (BUSPAR) 5 MG tablet   traZODone (DESYREL) 50 MG tablet   Psychophysiological insomnia   Relevant Medications   traZODone (DESYREL) 50 MG tablet   Routine medical exam    Annual physical exam without new findings.  Well adult with no acute concerns.  Plan: 1. Obtain health maintenance screenings as above according to age. - Increase physical activity to 30 minutes most days of the week.  - Eat healthy diet high in vegetables and fruits; low in refined carbohydrates. - Screening labs and tests as ordered -Mammo & DEXA scheduled -Due for Colonoscopy 09/2020 2. Return 1 year for annual physical.          Meds ordered this encounter  Medications  . busPIRone (BUSPAR) 5 MG tablet    Sig: Take 1 tablet (5 mg total) by mouth 3 (three) times daily.    Dispense:  270 tablet    Refill:  1  . traZODone (DESYREL) 50 MG  tablet    Sig: Take 1 tablet (50 mg total) by mouth at bedtime as needed for sleep.     Dispense:  90 tablet    Refill:  4  . valACYclovir (VALTREX) 1000 MG tablet    Sig: Take 2 tablets (2,000 mg) at the start of your cold sore.  Take 1 tablet (1,000 mg) daily until sores have resolved.    Dispense:  15 tablet    Refill:  1      Follow up plan: Return in about 3 months (around 10/19/2019) for Anxiety follow up.  Harlin Rain, FNP-C Family Nurse Practitioner [facility] 07/19/2019, 9:35 AM

## 2019-07-19 NOTE — Assessment & Plan Note (Signed)
Stable and well controlled with Buspar 5mg  TID PRN.  Has found she has been taking once daily with good relief of symptoms.  Discussed can take once per day but can increase if needed.  If needing more than 3x per day, will be able to discuss dose increase so not needing to take as many tablets.  Plan: 1. Continue Buspar 5mg  TID PRN 2. Mood handout given at last visit, to review 3. Follow up in 3 months

## 2019-07-19 NOTE — Assessment & Plan Note (Signed)
Annual physical exam without new findings.  Well adult with no acute concerns.  Plan: 1. Obtain health maintenance screenings as above according to age. - Increase physical activity to 30 minutes most days of the week.  - Eat healthy diet high in vegetables and fruits; low in refined carbohydrates. - Screening labs and tests as ordered -Mammo & DEXA scheduled -Due for Colonoscopy 09/2020 2. Return 1 year for annual physical.

## 2019-08-22 ENCOUNTER — Encounter: Payer: Self-pay | Admitting: Family Medicine

## 2019-08-22 ENCOUNTER — Ambulatory Visit
Admission: RE | Admit: 2019-08-22 | Discharge: 2019-08-22 | Disposition: A | Discharge status: Home / Self Care | Payer: Medicare Other | Source: Ambulatory Visit | Attending: Family Medicine | Admitting: Family Medicine

## 2019-08-22 DIAGNOSIS — M8580 Other specified disorders of bone density and structure, unspecified site: Secondary | ICD-10-CM | POA: Insufficient documentation

## 2019-08-22 DIAGNOSIS — Z780 Asymptomatic menopausal state: Secondary | ICD-10-CM | POA: Insufficient documentation

## 2019-08-22 DIAGNOSIS — Z13820 Encounter for screening for osteoporosis: Secondary | ICD-10-CM | POA: Diagnosis not present

## 2019-08-22 DIAGNOSIS — Z1231 Encounter for screening mammogram for malignant neoplasm of breast: Secondary | ICD-10-CM | POA: Insufficient documentation

## 2019-08-22 DIAGNOSIS — M85832 Other specified disorders of bone density and structure, left forearm: Secondary | ICD-10-CM | POA: Diagnosis not present

## 2019-08-23 DIAGNOSIS — H40033 Anatomical narrow angle, bilateral: Secondary | ICD-10-CM | POA: Diagnosis not present

## 2019-08-23 DIAGNOSIS — H2513 Age-related nuclear cataract, bilateral: Secondary | ICD-10-CM | POA: Diagnosis not present

## 2019-08-24 ENCOUNTER — Encounter: Payer: Self-pay | Admitting: Family Medicine

## 2019-09-26 ENCOUNTER — Telehealth (INDEPENDENT_AMBULATORY_CARE_PROVIDER_SITE_OTHER): Payer: Medicare Other | Admitting: Family Medicine

## 2019-09-26 ENCOUNTER — Encounter: Payer: Self-pay | Admitting: Family Medicine

## 2019-09-26 ENCOUNTER — Other Ambulatory Visit: Payer: Self-pay

## 2019-09-26 DIAGNOSIS — F419 Anxiety disorder, unspecified: Secondary | ICD-10-CM

## 2019-09-26 DIAGNOSIS — R141 Gas pain: Secondary | ICD-10-CM

## 2019-09-26 DIAGNOSIS — F5104 Psychophysiologic insomnia: Secondary | ICD-10-CM | POA: Diagnosis not present

## 2019-09-26 MED ORDER — FLUOXETINE HCL 10 MG PO TABS: 10.0000 mg | ORAL_TABLET | Freq: Every day | ORAL | 0 refills | Status: DC

## 2019-09-26 MED ORDER — TRAZODONE HCL 50 MG PO TABS: 50.0000 mg | ORAL_TABLET | Freq: Every evening | ORAL | 1 refills | Status: DC | PRN

## 2019-09-26 NOTE — Assessment & Plan Note (Signed)
Reports is taking buspar once per day, believes that it may be giving her a depression.  Discussed options of switching off of buspar and looking at a daily SSRI to help with anxiety and if there is any underlying depression.  Patient interested in trial of SSRI fluoxetine.    Plan: 1. Begin fluoxetine 10mg  daily 2. Can stop buspar 5mg  at this time 3. RTC in 2 weeks for re-evaluation

## 2019-09-26 NOTE — Progress Notes (Signed)
Virtual Visit via Telephone  The purpose of this virtual visit is to provide medical care while limiting exposure to the novel coronavirus (COVID19) for both patient and office staff.  Consent was obtained for phone visit:  Yes.   Answered questions that patient had about telehealth interaction:  Yes.   I discussed the limitations, risks, security and privacy concerns of performing an evaluation and management service by telephone. I also discussed with the patient that there may be a patient responsible charge related to this service. The patient expressed understanding and agreed to proceed.  Patient is at home and is accessed via telephone Services are provided by Harlin Rain, FNP-C from [facility])  ---------------------------------------------------------------------- Chief Complaint  Patient presents with  . Depression    pt concern that her anxiety medication is causing depression. Buspar 5MG  once daily. She does admit that sometimes when she take the Trazodone she have problems staying asleep and then she take another dose of the Buspar.    Marland Kitchen Bloated    gassy symptoms. Loose stool this morning    S: Reviewed CMA documentation. I have called patient and gathered additional HPI as follows:  Ms. Spiewak presents to clinic for concerns that her anxiety medication may be causing a depression, insomnia and gas.  Reports she has been taking her buspar 5mg  once daily and believes that it may be giving her some depression symptoms.  Has a history of depression many years ago but is unsure what she had taken to help with her symptoms.  Has been taking trazodone 50mg  at bedtime, sometimes waking up at 2am and having difficulty with getting back to bed.  Has concerns for gas that has been intermittent.  Reports small bowel movement this morning and not a consistent bowel regimen.  Has not tried anything for her symptoms.  Patient is currently home Denies  any high risk travel to areas of current concern for COVID19. Denies any known or suspected exposure to person with or possibly with COVID19.  Past Medical History:  Diagnosis Date  . Anemia    Social History   Tobacco Use  . Smoking status: Former Smoker    Quit date: 11/12/1987    Years since quitting: 31.8  . Smokeless tobacco: Never Used  Vaping Use  . Vaping Use: Never used  Substance Use Topics  . Alcohol use: Yes    Alcohol/week: 5.0 standard drinks    Types: 5 Glasses of wine per week    Comment: weekly  . Drug use: Not Currently    Comment: past marijuana - very remote use    Current Outpatient Medications:  .  acyclovir ointment (ZOVIRAX) 5 %, Apply 1 application topically every 3 (three) hours as needed., Disp: 15 g, Rfl: 1 .  busPIRone (BUSPAR) 5 MG tablet, Take 1 tablet (5 mg total) by mouth 3 (three) times daily., Disp: 270 tablet, Rfl: 1 .  latanoprost (XALATAN) 0.005 % ophthalmic solution, Administer 1 drop to both eyes nightly., Disp: , Rfl:  .  traZODone (DESYREL) 50 MG tablet, Take 1-2 tablets (50-100 mg total) by mouth at bedtime as needed for sleep., Disp: 180 tablet, Rfl: 1 .  valACYclovir (VALTREX) 1000 MG tablet, Take 2 tablets (2,000 mg) at the start of your cold sore.  Take 1 tablet (1,000 mg) daily until sores have resolved., Disp: 15 tablet, Rfl: 1 .  FLUoxetine (PROZAC) 10 MG tablet, Take 1 tablet (10 mg total) by mouth daily., Disp: 90 tablet,  Rfl: 0  Depression screen [facility] 2/9 09/26/2019 07/05/2019 12/21/2018  Decreased Interest 0 0 0  Down, Depressed, Hopeless 0 0 0  PHQ - 2 Score 0 0 0  Altered sleeping 3 1 -  Tired, decreased energy 0 0 -  Change in appetite 0 0 -  Feeling bad or failure about yourself  0 0 -  Trouble concentrating 0 0 -  Moving slowly or fidgety/restless 0 0 -  Suicidal thoughts 0 0 -  PHQ-9 Score 3 1 -  Difficult doing work/chores Somewhat difficult Not difficult at all -    GAD 7 : Generalized Anxiety Score 09/26/2019  07/05/2019 11/11/2017  Nervous, Anxious, on Edge 0 1 3  Control/stop worrying 3 0 3  Worry too much - different things 3 0 3  Trouble relaxing 1 1 3   Restless 0 0 2  Easily annoyed or irritable 1 3 3   Afraid - awful might happen 1 3 3   Total GAD 7 Score 9 8 20   Anxiety Difficulty Somewhat difficult Somewhat difficult Somewhat difficult    -------------------------------------------------------------------------- O: No physical exam performed due to remote telephone encounter.  Physical Exam: Patient remotely monitored without video.  Verbal communication appropriate.  Cognition normal.  Recent Results (from the past 2160 hour(s))  CBC with Differential     Status: None   Collection Time: 07/05/19 10:41 AM  Result Value Ref Range   WBC 7.0 3.8 - 10.8 Thousand/uL   RBC 4.74 3.80 - 5.10 Million/uL   Hemoglobin 15.1 11.7 - 15.5 g/dL   HCT 44.5 35 - 45 %   MCV 93.9 80.0 - 100.0 fL   MCH 31.9 27.0 - 33.0 pg   MCHC 33.9 32.0 - 36.0 g/dL   RDW 13.2 11.0 - 15.0 %   Platelets 252 140 - 400 Thousand/uL   MPV 10.6 7.5 - 12.5 fL   Neutro Abs 4,760 1,500 - 7,800 cells/uL   Lymphs Abs 1,477 850 - 3,900 cells/uL   Absolute Monocytes 672 200 - 950 cells/uL   Eosinophils Absolute 28 15 - 500 cells/uL   Basophils Absolute 63 0 - 200 cells/uL   Neutrophils Relative % 68 %   Total Lymphocyte 21.1 %   Monocytes Relative 9.6 %   Eosinophils Relative 0.4 %   Basophils Relative 0.9 %  COMPLETE METABOLIC PANEL WITH GFR     Status: Abnormal   Collection Time: 07/05/19 10:41 AM  Result Value Ref Range   Glucose, Bld 122 (H) 65 - 99 mg/dL    Comment: .            Fasting reference interval . For someone without known diabetes, a glucose value between 100 and 125 mg/dL is consistent with prediabetes and should be confirmed with a follow-up test. .    BUN 8 7 - 25 mg/dL   Creat 0.67 0.60 - 0.93 mg/dL    Comment: For patients >31 years of age, the reference limit for Creatinine is  approximately 13% higher for people identified as African-American. .    GFR, Est Non African American 87 > OR = 60 mL/min/1.45m2   GFR, Est African American 101 > OR = 60 mL/min/1.23m2   BUN/Creatinine Ratio NOT APPLICABLE 6 - 22 (calc)   Sodium 140 135 - 146 mmol/L   Potassium 4.3 3.5 - 5.3 mmol/L   Chloride 103 98 - 110 mmol/L   CO2 27 20 - 32 mmol/L   Calcium 9.9 8.6 - 10.4 mg/dL   Total Protein 6.5  6.1 - 8.1 g/dL   Albumin 4.7 3.6 - 5.1 g/dL   Globulin 1.8 (L) 1.9 - 3.7 g/dL (calc)   AG Ratio 2.6 (H) 1.0 - 2.5 (calc)   Total Bilirubin 0.5 0.2 - 1.2 mg/dL   Alkaline phosphatase (APISO) 58 37 - 153 U/L   AST 18 10 - 35 U/L   ALT 15 6 - 29 U/L  Lipid Profile     Status: Abnormal   Collection Time: 07/05/19 10:41 AM  Result Value Ref Range   Cholesterol 233 (H) <200 mg/dL   HDL 106 > OR = 50 mg/dL   Triglycerides 45 <150 mg/dL   LDL Cholesterol (Calc) 114 (H) mg/dL (calc)    Comment: Reference range: <100 . Desirable range <100 mg/dL for primary prevention;   <70 mg/dL for patients with CHD or diabetic patients  with > or = 2 CHD risk factors. Marland Kitchen LDL-C is now calculated using the Martin-Hopkins  calculation, which is a validated novel method providing  better accuracy than the Friedewald equation in the  estimation of LDL-C.  Cresenciano Genre et al. Annamaria Helling. 5638;756(43): 2061-2068  (http://education.[facility].com/faq/FAQ164)    Total CHOL/HDL Ratio 2.2 <5.0 (calc)   Non-HDL Cholesterol (Calc) 127 <130 mg/dL (calc)    Comment: For patients with diabetes plus 1 major ASCVD risk  factor, treating to a non-HDL-C goal of <100 mg/dL  (LDL-C of <70 mg/dL) is considered a therapeutic  option.   Thyroid Panel With TSH     Status: Abnormal   Collection Time: 07/05/19 10:41 AM  Result Value Ref Range   T3 Uptake 36 (H) 22 - 35 %   T4, Total 4.1 (L) 5.1 - 11.9 mcg/dL   Free Thyroxine Index 1.5 1.4 - 3.8   TSH 2.91 0.40 - 4.50 mIU/L  Hemoglobin A1C w/out eAG     Status: None    Collection Time: 07/05/19 10:41 AM  Result Value Ref Range   Hgb A1c MFr Bld 5.2 <5.7 % of total Hgb    Comment: For the purpose of screening for the presence of diabetes: . <5.7%       Consistent with the absence of diabetes 5.7-6.4%    Consistent with increased risk for diabetes             (prediabetes) > or =6.5%  Consistent with diabetes . This assay result is consistent with a decreased risk of diabetes. . Currently, no consensus exists regarding use of hemoglobin A1c for diagnosis of diabetes in children. . According to American Diabetes Association (ADA) guidelines, hemoglobin A1c <7.0% represents optimal control in non-pregnant diabetic patients. Different metrics may apply to specific patient populations.  Standards of Medical Care in Diabetes(ADA). Marland Kitchen   POCT Urinalysis Dipstick     Status: Abnormal   Collection Time: 07/05/19 11:00 AM  Result Value Ref Range   Color, UA Yellow    Clarity, UA clear    Glucose, UA Negative Negative   Bilirubin, UA negative    Ketones, UA negative    Spec Grav, UA <=1.005 (A) 1.010 - 1.025   Blood, UA negative    pH, UA 7.0 5.0 - 8.0   Protein, UA Negative Negative   Urobilinogen, UA 0.2 0.2 or 1.0 E.U./dL   Nitrite, UA negative    Leukocytes, UA Negative Negative   Appearance     Odor      -------------------------------------------------------------------------- A&P:  Problem List Items Addressed This Visit      Other   Anxiety - Primary  Reports is taking buspar once per day, believes that it may be giving her a depression.  Discussed options of switching off of buspar and looking at a daily SSRI to help with anxiety and if there is any underlying depression.  Patient interested in trial of SSRI fluoxetine.    Plan: 1. Begin fluoxetine 10mg  daily 2. Can stop buspar 5mg  at this time 3. RTC in 2 weeks for re-evaluation      Relevant Medications   traZODone (DESYREL) 50 MG tablet   FLUoxetine (PROZAC) 10 MG tablet    Psychophysiological insomnia    Having difficulty with sleep, awaking most nights around 2am.  Discussed can increase trazodone to 100mg  at bedtime, or can take 50mg  at bedtime and an additional 50mg  if she awakes in the middle of the night.  Patient agreeable to trial of increased trazodone.  Plan: 1. Increase trazodone to 100mg  at bedtime or can take 50mg  at bedtime and repeat dose if awakening in the middle of the night 2. RTC in 2 weeks for re-evaluation      Relevant Medications   traZODone (DESYREL) 50 MG tablet   Gas pain    Discussed options of OTC preparations such as Gas-X, can mix apple cider vinegar with water and sip that to help clear gas.  Discussed can try OTC mirilax, 1 cap full daily to see if this helps with her bowel regularity and reducing the gas that has been present.  Plan: 1. Trial of OTC preparations such as Gas-X and/or mirilax 2. Can try apple cider vinegar mixed with water to help alleviate gas 3. RTC if symptoms worsen or fail to improve         Meds ordered this encounter  Medications  . traZODone (DESYREL) 50 MG tablet    Sig: Take 1-2 tablets (50-100 mg total) by mouth at bedtime as needed for sleep.    Dispense:  180 tablet    Refill:  1  . FLUoxetine (PROZAC) 10 MG tablet    Sig: Take 1 tablet (10 mg total) by mouth daily.    Dispense:  90 tablet    Refill:  0    Follow-up: - Return in 2 weeks for re-evaluation  Patient verbalizes understanding with the above medical recommendations including the limitation of remote medical advice.  Specific follow-up and call-back criteria were given for patient to follow-up or seek medical care more urgently if needed.  - Time spent in direct consultation with patient on phone: 7 minutes  Harlin Rain, [facility] 09/26/2019, 4:45 PM

## 2019-09-26 NOTE — Assessment & Plan Note (Signed)
Discussed options of OTC preparations such as Gas-X, can mix apple cider vinegar with water and sip that to help clear gas.  Discussed can try OTC mirilax, 1 cap full daily to see if this helps with her bowel regularity and reducing the gas that has been present.  Plan: 1. Trial of OTC preparations such as Gas-X and/or mirilax 2. Can try apple cider vinegar mixed with water to help alleviate gas 3. RTC if symptoms worsen or fail to improve

## 2019-09-26 NOTE — Assessment & Plan Note (Signed)
Having difficulty with sleep, awaking most nights around 2am.  Discussed can increase trazodone to 100mg  at bedtime, or can take 50mg  at bedtime and an additional 50mg  if she awakes in the middle of the night.  Patient agreeable to trial of increased trazodone.  Plan: 1. Increase trazodone to 100mg  at bedtime or can take 50mg  at bedtime and repeat dose if awakening in the middle of the night 2. RTC in 2 weeks for re-evaluation

## 2019-10-11 DIAGNOSIS — H2513 Age-related nuclear cataract, bilateral: Secondary | ICD-10-CM | POA: Diagnosis not present

## 2019-10-11 DIAGNOSIS — H40033 Anatomical narrow angle, bilateral: Secondary | ICD-10-CM | POA: Diagnosis not present

## 2019-10-11 DIAGNOSIS — H2189 Other specified disorders of iris and ciliary body: Secondary | ICD-10-CM | POA: Diagnosis not present

## 2019-10-16 ENCOUNTER — Other Ambulatory Visit: Payer: Self-pay | Admitting: Family Medicine

## 2019-10-16 DIAGNOSIS — F419 Anxiety disorder, unspecified: Secondary | ICD-10-CM

## 2019-12-14 ENCOUNTER — Other Ambulatory Visit: Payer: Self-pay

## 2019-12-14 ENCOUNTER — Ambulatory Visit (INDEPENDENT_AMBULATORY_CARE_PROVIDER_SITE_OTHER): Payer: Medicare Other | Admitting: Family Medicine

## 2019-12-14 ENCOUNTER — Encounter: Payer: Self-pay | Admitting: Family Medicine

## 2019-12-14 VITALS — BP 95/50 | HR 78 | Temp 98.8°F | Resp 17 | Ht 67.0 in | Wt 119.6 lb

## 2019-12-14 DIAGNOSIS — F5104 Psychophysiologic insomnia: Secondary | ICD-10-CM

## 2019-12-14 DIAGNOSIS — F419 Anxiety disorder, unspecified: Secondary | ICD-10-CM

## 2019-12-14 DIAGNOSIS — R059 Cough, unspecified: Secondary | ICD-10-CM | POA: Diagnosis not present

## 2019-12-14 DIAGNOSIS — Z23 Encounter for immunization: Secondary | ICD-10-CM | POA: Insufficient documentation

## 2019-12-14 MED ORDER — TRAZODONE HCL 50 MG PO TABS: 50.0000 mg | ORAL_TABLET | Freq: Every evening | ORAL | 1 refills | Status: DC | PRN

## 2019-12-14 MED ORDER — FLUOXETINE HCL 10 MG PO TABS: 10.0000 mg | ORAL_TABLET | Freq: Every day | ORAL | 1 refills | Status: DC

## 2019-12-14 MED ORDER — CETIRIZINE HCL 10 MG PO TABS: 10.0000 mg | ORAL_TABLET | Freq: Every day | ORAL | 0 refills | Status: DC

## 2019-12-14 NOTE — Assessment & Plan Note (Signed)
Pt > age 74.  Needs annual influenza vaccine.  VIS provided.  Plan: 1. Administer high dose fluzone today.  

## 2019-12-14 NOTE — Assessment & Plan Note (Signed)
Has found improved sleep with taking trazodone 100mg  nightly.  Has had a reduction in nightmares and reports if she is getting up in the middle of the night to use the restroom, that she is able to go back to sleep without an issue.  Will continue trazodone 50-100mg  QHS PRN for sleep.

## 2019-12-14 NOTE — Patient Instructions (Addendum)
Your medication refills have been sent to your pharmacy on file.  We have given you your influenza vaccine today  Continue all medications as directed  The following recommendations are helpful adjuncts for helping rebalance your mood.  Eat a nourishing diet. Ensure adequate intake of calories, protein, carbs, fat, vitamins, and minerals. Prioritize whole foods at each meal, including meats, vegetables, fruits, nuts and seeds, etc.   Avoid inflammatory and/or "junk" foods, such as sugar, omega-6 fats, refined grains, chemicals, and preservatives are common in packaged and prepared foods. Minimize or completely avoid these ingredients and stick to whole foods with little to no additives. Cook from scratch as much as possible for more control over what you eat  Get enough sleep. Poor sleep is significantly associated with depression and anxiety. Make 7-9 hours of sleep nightly a top priority  Exercise appropriately. Exercise is known to improve brain functioning and boost mood. Aim for 30 minutes of daily physical activity. Avoid "overtraining," which can cause mental disturbances  Assess your light exposure. Not enough natural light during the day and too much artificial light can have a major impact on your mood. Get outside as often as possible during daylight hours. Minimize light exposure after dark and avoid the use of electronics that give off blue light before bed  Manage your stress.  Use daily stress management techniques such as meditation, yoga, or mindfulness to retrain your brain to respond differently to stress. Try deep breathing to deactivate your "fight or flight" response.  There are many of sources with apps like Headspace, Calm or a variety of YouTube videos (videos from Gwynne Edinger have guided meditation)  Prioritize your social life. Work on building social support with new friends or improve current relationships. Consider getting a pet that allows for companionship,  social interaction, and physical touch. Try volunteering or joining a faith-based community to increase your sense of purpose  4-7-8 breathing technique at bedtime: breathe in to count of 4, hold breath for count of 7, exhale for count of 8; do 3-5 times for letting go of overactive thoughts  Take time to play Unstructured "play" time can help reduce anxiety and depression Options for play include music, games, sports, dance, art, etc.  Try to add daily omega 3 fatty acids, magnesium, B complex, and balanced amino acid supplements to help improve mood and anxiety.  Sleep hygiene is the single most effective treatment for sleep issues, but it is hard work.  Tips for a good night's sleep:  -Keep sleep environment comfortable and conducive to sleep -Keep regular sleep schedule 7 nights a week -Avoiding naps during the day -Avoiding going to bed until drowsy and ready to sleep, not trying to sleep, and not watching the clock -Get out of bed if not asleep within 15-20 minutes and returning only when drowsy -Avoiding caffeine, nicotine, alcohol, and other substances that interfere with sleep before bedtime -Take an hour before your set bedtime and start to wind down: bath/shower, no more TV or phone (the blue light can interfere with sleeping), listen to soothing music, or meditation -No TV in your bedroom -Exercising regularly, at least 6 hours before sleep. Yoga and Tai Chi can improve sleep quality  There are a lot of books and apps that may help guide you with any of the following:   -Progressive muscle relaxation (involves methodical tension and relaxation of different Muscle groups throughout body)  Guided imagery  -YouTube - Gwynne Edinger has free videos on YouTube that  can help with meditation and some   Abdominal breathing   Over the counter sleep aid one hour before bed- and gradually wean your use over 2-4 weeks  Some examples are : *Melatonin 5-10 mg *Sleepology (Can find  on [employer]) taken according to packaging directions  There are a few online evidence based online programs, unfortunately they are not free.   Developed by a sleep expert who created a drug-free program for insomnia proven more effective than sleeping pills.  www.cbtforinsomnia.com Sleepio is an evidence-based digital sleep improvement program   www.sleepio.com SHUTi is designed to actively help retrain your body and mind for great sleep through six engaging Cognitive Behavioral Therapy for Insomnia strategy and learning sessions  [email or web address]  We will plan to see you back in 6 months for medication follow up visit  You will receive a survey after today's visit either digitally by e-mail or paper by [employer]. Your experiences and feedback matter to [place].  Please respond so we know how we are doing as we provide care for you.  Call us with any questions/concerns/needs.  It is my goal to be available to you for your health concerns.  Thanks for choosing me to be a partner in your healthcare needs!  Harlin Rain, FNP-C Family Nurse Practitioner [facility] Phone: [phone]

## 2019-12-14 NOTE — Progress Notes (Signed)
Subjective:    Patient ID: Tracie Reed, female    DOB: 15-Dec-1945, 74 y.o.   MRN: [id number]  Tracie Reed is a 74 y.o. female presenting on 12/14/2019 for Anxiety, Cough, and Insomnia   HPI  [name] presents to clinic for a follow up on her anxiety, insomnia and with a concern for a cough for the past several months.  Notes that she has been having a dry, intermittent cough that is worse when wearing her face mask.  Denies history of asthma or COPD.  Has not taken anything for the symptoms.  Denies unintentional weight loss, lymphadenopathy, fevers, SOB, DOE, CP, abdominal pain, n/v/d.    Reports since starting on fluoxetine and increase of trazodone she has noticed an improvement in her anxiety and that her sleep has improved.  States she is sleeping better without having nightmares.  Is interested in continuing these medications.    Depression screen [facility] 2/9 09/26/2019 07/05/2019 12/21/2018  Decreased Interest 0 0 0  Down, Depressed, Hopeless 0 0 0  PHQ - 2 Score 0 0 0  Altered sleeping 3 1 -  Tired, decreased energy 0 0 -  Change in appetite 0 0 -  Feeling bad or failure about yourself  0 0 -  Trouble concentrating 0 0 -  Moving slowly or fidgety/restless 0 0 -  Suicidal thoughts 0 0 -  PHQ-9 Score 3 1 -  Difficult doing work/chores Somewhat difficult Not difficult at all -    Social History   Tobacco Use  . Smoking status: Former Smoker    Quit date: 11/12/1987    Years since quitting: 32.1  . Smokeless tobacco: Never Used  Vaping Use  . Vaping Use: Never used  Substance Use Topics  . Alcohol use: Not Currently  . Drug use: Not Currently    Comment: past marijuana - very remote use    Review of Systems  Constitutional: Negative.   HENT: Negative.   Eyes: Negative.   Respiratory: Positive for cough. Negative for apnea, choking, chest tightness, shortness of breath, wheezing and stridor.   Cardiovascular: Negative.   Gastrointestinal: Negative.     Endocrine: Negative.   Genitourinary: Negative.   Musculoskeletal: Negative.   Skin: Negative.   Allergic/Immunologic: Negative.   Neurological: Negative.   Hematological: Negative.   Psychiatric/Behavioral: Positive for sleep disturbance. Negative for agitation, behavioral problems, confusion, decreased concentration, dysphoric mood, hallucinations, self-injury and suicidal ideas. The patient is nervous/anxious. The patient is not hyperactive.    Per HPI unless specifically indicated above     Objective:    BP (!) 95/50 (BP Location: Right Arm, Patient Position: Sitting, Cuff Size: Normal)   Pulse 78   Temp 98.8 F (37.1 C) (Oral)   Resp 17   Ht 5\' 7"  (1.702 m)   Wt 119 lb 9.6 oz (54.3 kg)   SpO2 99%   BMI 18.73 kg/m   Wt Readings from Last 3 Encounters:  12/14/19 119 lb 9.6 oz (54.3 kg)  07/19/19 128 lb 9.6 oz (58.3 kg)  07/05/19 127 lb (57.6 kg)    Physical Exam Vitals and nursing note reviewed.  Constitutional:      General: She is not in acute distress.    Appearance: Normal appearance. She is well-developed, well-groomed and normal weight. She is not ill-appearing or toxic-appearing.  HENT:     Head: Normocephalic and atraumatic.     Nose:     Comments: Lizbeth Bark is in place, covering mouth and nose. Eyes:  General: Lids are normal. Vision grossly intact.        Right eye: No discharge.        Left eye: No discharge.     Extraocular Movements: Extraocular movements intact.     Conjunctiva/sclera: Conjunctivae normal.     Pupils: Pupils are equal, round, and reactive to light.  Cardiovascular:     Rate and Rhythm: Normal rate and regular rhythm.     Pulses: Normal pulses.     Heart sounds: Normal heart sounds. No murmur heard.  No friction rub. No gallop.   Pulmonary:     Effort: Pulmonary effort is normal. No respiratory distress.     Breath sounds: Normal breath sounds.  Skin:    General: Skin is warm and dry.     Capillary Refill: Capillary refill  takes less than 2 seconds.  Neurological:     General: No focal deficit present.     Mental Status: She is alert and oriented to person, place, and time.  Psychiatric:        Attention and Perception: Attention and perception normal.        Mood and Affect: Mood and affect normal.        Speech: Speech normal.        Behavior: Behavior normal. Behavior is cooperative.        Thought Content: Thought content normal.        Cognition and Memory: Cognition and memory normal.        Judgment: Judgment normal.    Results for orders placed or performed in visit on 07/05/19  CBC with Differential  Result Value Ref Range   WBC 7.0 3.8 - 10.8 Thousand/uL   RBC 4.74 3.80 - 5.10 Million/uL   Hemoglobin 15.1 11.7 - 15.5 g/dL   HCT 44.5 35 - 45 %   MCV 93.9 80.0 - 100.0 fL   MCH 31.9 27.0 - 33.0 pg   MCHC 33.9 32.0 - 36.0 g/dL   RDW 13.2 11.0 - 15.0 %   Platelets 252 140 - 400 Thousand/uL   MPV 10.6 7.5 - 12.5 fL   Neutro Abs 4,760 1,500 - 7,800 cells/uL   Lymphs Abs 1,477 850 - 3,900 cells/uL   Absolute Monocytes 672 200 - 950 cells/uL   Eosinophils Absolute 28 15.0 - 500.0 cells/uL   Basophils Absolute 63 0.0 - 200.0 cells/uL   Neutrophils Relative % 68 %   Total Lymphocyte 21.1 %   Monocytes Relative 9.6 %   Eosinophils Relative 0.4 %   Basophils Relative 0.9 %  COMPLETE METABOLIC PANEL WITH GFR  Result Value Ref Range   Glucose, Bld 122 (H) 65 - 99 mg/dL   BUN 8 7 - 25 mg/dL   Creat 0.67 0.60 - 0.93 mg/dL   GFR, Est Non African American 87 > OR = 60 mL/min/1.63m2   GFR, Est African American 101 > OR = 60 mL/min/1.80m2   BUN/Creatinine Ratio NOT APPLICABLE 6 - 22 (calc)   Sodium 140 135 - 146 mmol/L   Potassium 4.3 3.5 - 5.3 mmol/L   Chloride 103 98 - 110 mmol/L   CO2 27 20 - 32 mmol/L   Calcium 9.9 8.6 - 10.4 mg/dL   Total Protein 6.5 6.1 - 8.1 g/dL   Albumin 4.7 3.6 - 5.1 g/dL   Globulin 1.8 (L) 1.9 - 3.7 g/dL (calc)   AG Ratio 2.6 (H) 1.0 - 2.5 (calc)   Total Bilirubin  0.5 0.2 - 1.2 mg/dL  Alkaline phosphatase (APISO) 58 37 - 153 U/L   AST 18 10 - 35 U/L   ALT 15 6 - 29 U/L  Lipid Profile  Result Value Ref Range   Cholesterol 233 (H) <200 mg/dL   HDL 106 > OR = 50 mg/dL   Triglycerides 45 <150 mg/dL   LDL Cholesterol (Calc) 114 (H) mg/dL (calc)   Total CHOL/HDL Ratio 2.2 <5.0 (calc)   Non-HDL Cholesterol (Calc) 127 <130 mg/dL (calc)  Thyroid Panel With TSH  Result Value Ref Range   T3 Uptake 36 (H) 22 - 35 %   T4, Total 4.1 (L) 5.1 - 11.9 mcg/dL   Free Thyroxine Index 1.5 1.4 - 3.8   TSH 2.91 0.40 - 4.50 mIU/L  Hemoglobin A1C w/out eAG  Result Value Ref Range   Hgb A1c MFr Bld 5.2 <5.7 % of total Hgb  POCT Urinalysis Dipstick  Result Value Ref Range   Color, UA Yellow    Clarity, UA clear    Glucose, UA Negative Negative   Bilirubin, UA negative    Ketones, UA negative    Spec Grav, UA <=1.005 (A) 1.010 - 1.025   Blood, UA negative    pH, UA 7.0 5.0 - 8.0   Protein, UA Negative Negative   Urobilinogen, UA 0.2 0.2 or 1.0 E.U./dL   Nitrite, UA negative    Leukocytes, UA Negative Negative   Appearance     Odor        Assessment & Plan:   Problem List Items Addressed This Visit      Other   Anxiety - Primary    Stable and well controlled with fluoxetine 10mg  daily.  Will continue at this dose.  To continue to work on anxiety reduction techniques and review mood handout.  Plan: 1. Continue fluoxetine 10mg  daily 2. Continue anxiety reduction techniques and review mood handout 3. RTC in 6 months      Relevant Medications   FLUoxetine (PROZAC) 10 MG tablet   traZODone (DESYREL) 50 MG tablet   Psychophysiological insomnia    Has found improved sleep with taking trazodone 100mg  nightly.  Has had a reduction in nightmares and reports if she is getting up in the middle of the night to use the restroom, that she is able to go back to sleep without an issue.  Will continue trazodone 50-100mg  QHS PRN for sleep.      Relevant  Medications   traZODone (DESYREL) 50 MG tablet   Flu vaccine need    Pt > age 100.  Needs annual influenza vaccine.  VIS provided.  Plan: 1. Administer high dose fluzone today.       Relevant Orders   Flu Vaccine QUAD High Dose(Fluad) (Completed)   Cough    Discussed could have an allergen component since cough is worsened with mask usage.  Will trial cetirizine for the next 2-4 weeks and if has improvement, will continue.  If does not have improvement in cough, we discussed options with pulmonary function testing and referral to pulmonology to complete that.  Patient in agreement with plan.      Relevant Medications   cetirizine (ZYRTEC) 10 MG tablet      Meds ordered this encounter  Medications  . cetirizine (ZYRTEC) 10 MG tablet    Sig: Take 1 tablet (10 mg total) by mouth daily.    Dispense:  30 tablet    Refill:  0  . FLUoxetine (PROZAC) 10 MG tablet    Sig: Take  1 tablet (10 mg total) by mouth daily.    Dispense:  90 tablet    Refill:  1  . traZODone (DESYREL) 50 MG tablet    Sig: Take 1-2 tablets (50-100 mg total) by mouth at bedtime as needed for sleep.    Dispense:  180 tablet    Refill:  1    Follow up plan: Return in about 6 months (around 06/12/2020) for Anxiety, insomnia f/u.   Harlin Rain, [place] [facility] Nurse Practitioner [facility] 12/14/2019, 10:28 AM

## 2019-12-14 NOTE — Assessment & Plan Note (Signed)
Stable and well controlled with fluoxetine 10mg  daily.  Will continue at this dose.  To continue to work on anxiety reduction techniques and review mood handout.  Plan: 1. Continue fluoxetine 10mg  daily 2. Continue anxiety reduction techniques and review mood handout 3. RTC in 6 months

## 2019-12-14 NOTE — Assessment & Plan Note (Signed)
Discussed could have an allergen component since cough is worsened with mask usage.  Will trial cetirizine for the next 2-4 weeks and if has improvement, will continue.  If does not have improvement in cough, we discussed options with pulmonary function testing and referral to pulmonology to complete that.  Patient in agreement with plan.

## 2019-12-20 ENCOUNTER — Ambulatory Visit: Payer: Self-pay | Admitting: *Deleted

## 2019-12-20 NOTE — Telephone Encounter (Signed)
I spoke with the patient and informed her that we currently do not have any appt available for today. I recommended that the patient go to the Urgent Care for evaluation and treatment. She verbalize understanding, no questions or concerns.

## 2019-12-20 NOTE — Telephone Encounter (Signed)
Summary: Pain and numbness since getting the Shingrix on 11/18   Patient called to inform the nurse or doctor that after her Shingrix vaccine on 11/18, she has been experiencing some numbness and pain. She called the pharmacy where she had gotten the vaccine and they informed her to call her PCP. Patient called and said she has been taking OTC pain meds but it is not helping. Please advise and call patient to discuss at [phone]     Call to patient- she had her shingrix vaccine and has developed numbness, tingling and pain in her arm that is not going away. Call to office to see if she can be seen for this- call tranferred Reason for Disposition . Shingles (Herpes zoster; Shingrix) vaccine reactions . [1] Over 3 days (72 hours) since shot AND [2] redness, swelling or pain getting worse  Answer Assessment - Initial Assessment Questions 1. SYMPTOMS: "What is the main symptom?" (e.g., redness, swelling, pain)      Numbness, tingling and pain in R arm 2. ONSET: "When was the vaccine (shot) given?" "How much later did the pain begin?" (e.g., hours, days ago)      11/18- later that day 3. SEVERITY: "How bad is it?"      Hard to hold things in the hand 4. FEVER: "Is there a fever?" If Yes, ask: "What is it, how was it measured, and when did it start?"      no 5. IMMUNIZATIONS GIVEN: "What shots have you recently received?"     Flu shot- other arm day before, shingrix 6. PAST REACTIONS: "Have you reacted to immunizations before?" If Yes, ask: "What happened?"     Not like this 7. OTHER SYMPTOMS: "Do you have any other symptoms?" no  Protocols used: IMMUNIZATION REACTIONS-A-AH

## 2019-12-27 ENCOUNTER — Encounter: Payer: Self-pay | Admitting: Family Medicine

## 2019-12-27 ENCOUNTER — Other Ambulatory Visit: Payer: Self-pay

## 2019-12-27 ENCOUNTER — Ambulatory Visit (INDEPENDENT_AMBULATORY_CARE_PROVIDER_SITE_OTHER): Payer: Medicare Other | Admitting: Family Medicine

## 2019-12-27 VITALS — BP 104/66 | HR 74 | Resp 17 | Ht 67.0 in | Wt 119.0 lb

## 2019-12-27 DIAGNOSIS — M79601 Pain in right arm: Secondary | ICD-10-CM | POA: Insufficient documentation

## 2019-12-27 MED ORDER — DICLOFENAC SODIUM 1 % EX CREA: 1.0000 "application " | TOPICAL_CREAM | Freq: Four times a day (QID) | CUTANEOUS | 1 refills | Status: DC | PRN

## 2019-12-27 NOTE — Progress Notes (Signed)
Subjective:    Patient ID: Tracie Reed, female    DOB: 27-Oct-1945, 74 y.o.   MRN: [id number]  Tracie Reed is a 74 y.o. female presenting on 12/27/2019 for Arm Pain (pt complains Rt deltoid arm numbness, tingling and pain after getting a shingrix vaccine in the Rt deltoid x 1.5 weeks . Pt state it started off as a constant pain, but over time she have notice improvement and the pain is intermittent.)   HPI  Tracie Reed presents to clinic with concerns of resolving right deltoid arm discomfort, with numbness/tingling after receiving shingrix vaccine with her pharmacy approximately 1.5 weeks ago.  States she had noticed the pain the evening of her vaccine and that the numbness/tingling was interfering with her sleep is now more intermittent and is beginning to resolve.    Depression screen [facility] 2/9 09/26/2019 07/05/2019 12/21/2018  Decreased Interest 0 0 0  Down, Depressed, Hopeless 0 0 0  PHQ - 2 Score 0 0 0  Altered sleeping 3 1 -  Tired, decreased energy 0 0 -  Change in appetite 0 0 -  Feeling bad or failure about yourself  0 0 -  Trouble concentrating 0 0 -  Moving slowly or fidgety/restless 0 0 -  Suicidal thoughts 0 0 -  PHQ-9 Score 3 1 -  Difficult doing work/chores Somewhat difficult Not difficult at all -    Social History   Tobacco Use  . Smoking status: Former Smoker    Quit date: 11/12/1987    Years since quitting: 32.1  . Smokeless tobacco: Never Used  Vaping Use  . Vaping Use: Never used  Substance Use Topics  . Alcohol use: Not Currently  . Drug use: Not Currently    Comment: past marijuana - very remote use    Review of Systems  Constitutional: Negative.   HENT: Negative.   Eyes: Negative.   Respiratory: Negative.   Cardiovascular: Negative.   Gastrointestinal: Negative.   Endocrine: Negative.   Genitourinary: Negative.   Musculoskeletal: Positive for myalgias. Negative for arthralgias, back pain, gait problem, joint swelling, neck pain and  neck stiffness.  Skin: Negative.   Allergic/Immunologic: Negative.   Neurological: Positive for numbness. Negative for dizziness, tremors, seizures, syncope, facial asymmetry, speech difficulty, weakness, light-headedness and headaches.  Hematological: Negative.   Psychiatric/Behavioral: Negative.    Per HPI unless specifically indicated above     Objective:    BP 104/66 (BP Location: Left Arm, Patient Position: Sitting, Cuff Size: Small)   Pulse 74   Resp 17   Ht 5\' 7"  (1.702 m)   Wt 119 lb (54 kg)   SpO2 98%   BMI 18.64 kg/m   Wt Readings from Last 3 Encounters:  12/27/19 119 lb (54 kg)  12/14/19 119 lb 9.6 oz (54.3 kg)  07/19/19 128 lb 9.6 oz (58.3 kg)    Physical Exam Vitals and nursing note reviewed.  Constitutional:      General: She is not in acute distress.    Appearance: Normal appearance. She is well-developed, well-groomed and normal weight. She is not ill-appearing or toxic-appearing.  HENT:     Head: Normocephalic and atraumatic.     Nose:     Comments: Lizbeth Bark is in place, covering mouth and nose. Eyes:     General: Lids are normal. Vision grossly intact.        Right eye: No discharge.        Left eye: No discharge.     Extraocular Movements: Extraocular  movements intact.     Conjunctiva/sclera: Conjunctivae normal.     Pupils: Pupils are equal, round, and reactive to light.  Cardiovascular:     Rate and Rhythm: Normal rate and regular rhythm.     Pulses: Normal pulses.     Heart sounds: Normal heart sounds. No murmur heard.  No friction rub. No gallop.   Pulmonary:     Effort: Pulmonary effort is normal. No respiratory distress.     Breath sounds: Normal breath sounds.  Musculoskeletal:        General: No swelling or tenderness. Normal range of motion.  Skin:    General: Skin is warm and dry.     Capillary Refill: Capillary refill takes less than 2 seconds.  Neurological:     General: No focal deficit present.     Mental Status: She is alert  and oriented to person, place, and time.  Psychiatric:        Attention and Perception: Attention and perception normal.        Mood and Affect: Mood and affect normal.        Speech: Speech normal.        Behavior: Behavior normal. Behavior is cooperative.        Thought Content: Thought content normal.        Cognition and Memory: Cognition and memory normal.        Judgment: Judgment normal.    Results for orders placed or performed in visit on 07/05/19  CBC with Differential  Result Value Ref Range   WBC 7.0 3.8 - 10.8 Thousand/uL   RBC 4.74 3.80 - 5.10 Million/uL   Hemoglobin 15.1 11.7 - 15.5 g/dL   HCT 44.5 35 - 45 %   MCV 93.9 80.0 - 100.0 fL   MCH 31.9 27.0 - 33.0 pg   MCHC 33.9 32.0 - 36.0 g/dL   RDW 13.2 11.0 - 15.0 %   Platelets 252 140 - 400 Thousand/uL   MPV 10.6 7.5 - 12.5 fL   Neutro Abs 4,760 1,500 - 7,800 cells/uL   Lymphs Abs 1,477 850 - 3,900 cells/uL   Absolute Monocytes 672 200 - 950 cells/uL   Eosinophils Absolute 28 15.0 - 500.0 cells/uL   Basophils Absolute 63 0.0 - 200.0 cells/uL   Neutrophils Relative % 68 %   Total Lymphocyte 21.1 %   Monocytes Relative 9.6 %   Eosinophils Relative 0.4 %   Basophils Relative 0.9 %  COMPLETE METABOLIC PANEL WITH GFR  Result Value Ref Range   Glucose, Bld 122 (H) 65 - 99 mg/dL   BUN 8 7 - 25 mg/dL   Creat 0.67 0.60 - 0.93 mg/dL   GFR, Est Non African American 87 > OR = 60 mL/min/1.18m2   GFR, Est African American 101 > OR = 60 mL/min/1.73m2   BUN/Creatinine Ratio NOT APPLICABLE 6 - 22 (calc)   Sodium 140 135 - 146 mmol/L   Potassium 4.3 3.5 - 5.3 mmol/L   Chloride 103 98 - 110 mmol/L   CO2 27 20 - 32 mmol/L   Calcium 9.9 8.6 - 10.4 mg/dL   Total Protein 6.5 6.1 - 8.1 g/dL   Albumin 4.7 3.6 - 5.1 g/dL   Globulin 1.8 (L) 1.9 - 3.7 g/dL (calc)   AG Ratio 2.6 (H) 1.0 - 2.5 (calc)   Total Bilirubin 0.5 0.2 - 1.2 mg/dL   Alkaline phosphatase (APISO) 58 37 - 153 U/L   AST 18 10 - 35 U/L  ALT 15 6 - 29 U/L    Lipid Profile  Result Value Ref Range   Cholesterol 233 (H) <200 mg/dL   HDL 106 > OR = 50 mg/dL   Triglycerides 45 <150 mg/dL   LDL Cholesterol (Calc) 114 (H) mg/dL (calc)   Total CHOL/HDL Ratio 2.2 <5.0 (calc)   Non-HDL Cholesterol (Calc) 127 <130 mg/dL (calc)  Thyroid Panel With TSH  Result Value Ref Range   T3 Uptake 36 (H) 22 - 35 %   T4, Total 4.1 (L) 5.1 - 11.9 mcg/dL   Free Thyroxine Index 1.5 1.4 - 3.8   TSH 2.91 0.40 - 4.50 mIU/L  Hemoglobin A1C w/out eAG  Result Value Ref Range   Hgb A1c MFr Bld 5.2 <5.7 % of total Hgb  POCT Urinalysis Dipstick  Result Value Ref Range   Color, UA Yellow    Clarity, UA clear    Glucose, UA Negative Negative   Bilirubin, UA negative    Ketones, UA negative    Spec Grav, UA <=1.005 (A) 1.010 - 1.025   Blood, UA negative    pH, UA 7.0 5.0 - 8.0   Protein, UA Negative Negative   Urobilinogen, UA 0.2 0.2 or 1.0 E.U./dL   Nitrite, UA negative    Leukocytes, UA Negative Negative   Appearance     Odor        Assessment & Plan:   Problem List Items Addressed This Visit      Other   Right arm pain - Primary    Right upper arm pain with resolving radicular symptoms likely secondary to nerve irritation from shingrix vaccination.  Has been having some relief with ibuprofen and topical diclofenac PRN.  Will send in Rx for diclofenac cream to use topically PRN for relief.  To RTC if symptoms worsen or fail to improve.      Relevant Medications   Diclofenac Sodium 1 % CREA      Meds ordered this encounter  Medications  . Diclofenac Sodium 1 % CREA    Sig: Apply 1 application topically 4 (four) times daily as needed (right arm pain).    Dispense:  120 g    Refill:  1    Follow up plan: Return if symptoms worsen or fail to improve.   Harlin Rain, [place] [facility] Nurse Practitioner [facility] 12/27/2019, 4:38 PM

## 2019-12-27 NOTE — Patient Instructions (Signed)
We will plan to see you back if your symptoms worsen or fail to improve  You will receive a survey after today's visit either digitally by e-mail or paper by USPS mail. Your experiences and feedback matter to [place].  Please respond so we know how we are doing as we provide care for you.  Call us with any questions/concerns/needs.  It is my goal to be available to you for your health concerns.  Thanks for choosing me to be a partner in your healthcare needs!  Harlin Rain, FNP-C Family Nurse Practitioner [facility] Phone: [phone]

## 2019-12-27 NOTE — Assessment & Plan Note (Signed)
Right upper arm pain with resolving radicular symptoms likely secondary to nerve irritation from shingrix vaccination.  Has been having some relief with ibuprofen and topical diclofenac PRN.  Will send in Rx for diclofenac cream to use topically PRN for relief.  To RTC if symptoms worsen or fail to improve.

## 2020-01-13 ENCOUNTER — Other Ambulatory Visit: Payer: Self-pay | Admitting: Family Medicine

## 2020-01-13 DIAGNOSIS — R059 Cough, unspecified: Secondary | ICD-10-CM

## 2020-01-13 MED ORDER — CETIRIZINE HCL 10 MG PO TABS: 10.0000 mg | ORAL_TABLET | Freq: Every day | ORAL | 0 refills | Status: DC

## 2020-01-13 NOTE — Telephone Encounter (Signed)
Medication: cetirizine (ZYRTEC) 10 MG tablet [[id number]]   Has the patient contacted their pharmacy? YES  (Agent: If no, request that the patient contact the pharmacy for the refill.) (Agent: If yes, when and what did the pharmacy advise?)  Preferred Pharmacy (with phone number or street name): [facility] [place] [facility] [place]. ([address]  [place], [place] [address]  Phone:  [phone] Fax:  [phone]   Agent: Please be advised that RX refills may take up to 3 business days. We ask that you follow-up with your pharmacy.

## 2020-02-07 ENCOUNTER — Ambulatory Visit (INDEPENDENT_AMBULATORY_CARE_PROVIDER_SITE_OTHER): Payer: Medicare Other

## 2020-02-07 ENCOUNTER — Telehealth: Payer: Self-pay

## 2020-02-07 VITALS — Ht 67.5 in | Wt 115.0 lb

## 2020-02-07 DIAGNOSIS — Z0000 Encounter for general adult medical examination without abnormal findings: Secondary | ICD-10-CM | POA: Diagnosis not present

## 2020-02-07 NOTE — Progress Notes (Signed)
I connected with  [name] today via telehealth video enabled device and verified that I am speaking with the correct person using two identifiers.   Location: Patient: home Provider: home  Persons participating in virtual visit: patient, provider  I discussed the limitations, risks, security and privacy concerns of performing an evaluation and management service by telephone and the availability of in person appointments. The patient expressed understanding and agreed to proceed.   Some vital signs may be absent or patient reported.       Subjective:   Aniqa Hare is a 75 y.o. female who presents for Medicare Annual (Subsequent) preventive examination.  Review of Systems     Cardiac Risk Factors include: advanced age (>87men, >41 women)     Objective:    Today's Vitals   02/07/20 0821  Weight: 115 lb (52.2 kg)  Height: 5' 7.5" (1.715 m)   Body mass index is 17.75 kg/m.  Advanced Directives 02/07/2020 12/21/2018 12/15/2017  Does Patient Have a Medical Advance Directive? Yes Yes Yes  Type of Paramedic of [place];Living will Living will;Healthcare Power of [place];Living will  Copy of [place] in Chart? No - copy requested No - copy requested No - copy requested    Current Medications (verified) Outpatient Encounter Medications as of 02/07/2020  Medication Sig  . acyclovir ointment (ZOVIRAX) 5 % Apply 1 application topically every 3 (three) hours as needed.  . calcium carbonate (OS-CAL) 1250 (500 Ca) MG chewable tablet Chew 1 tablet by mouth daily.  . cetirizine (ZYRTEC) 10 MG tablet Take 1 tablet (10 mg total) by mouth daily.  Marland Kitchen FLUoxetine (PROZAC) 10 MG tablet Take 1 tablet (10 mg total) by mouth daily.  Marland Kitchen latanoprost (XALATAN) 0.005 % ophthalmic solution Administer 1 drop to both eyes nightly.  . traZODone (DESYREL) 50 MG tablet Take 1-2 tablets (50-100 mg total) by mouth at bedtime  as needed for sleep.  . valACYclovir (VALTREX) 1000 MG tablet Take 2 tablets (2,000 mg) at the start of your cold sore.  Take 1 tablet (1,000 mg) daily until sores have resolved.  . Diclofenac Sodium 1 % CREA Apply 1 application topically 4 (four) times daily as needed (right arm pain). (Patient not taking: Reported on 02/07/2020)   No facility-administered encounter medications on file as of 02/07/2020.    Allergies (verified) Patient has no known allergies.   History: Past Medical History:  Diagnosis Date  . Anemia    Past Surgical History:  Procedure Laterality Date  . BREAST EXCISIONAL BIOPSY Right 1993   neg  . BUNIONECTOMY Bilateral    Family History  Problem Relation Age of Onset  . Depression Mother   . Healthy Brother   . Breast cancer Neg Hx    Social History   Socioeconomic History  . Marital status: Significant Other    Spouse name: Not on file  . Number of children: 2  . Years of education: Not on file  . Highest education level: Bachelor's degree (e.g., BA, AB, BS)  Occupational History  . Occupation: self employed    Comment: Holiday representative  Tobacco Use  . Smoking status: Former Smoker    Quit date: 11/12/1987    Years since quitting: 32.2  . Smokeless tobacco: Never Used  Vaping Use  . Vaping Use: Never used  Substance and Sexual Activity  . Alcohol use: Not Currently  . Drug use: Not Currently    Comment:  past marijuana - very remote use  . Sexual activity: Yes  Other Topics Concern  . Not on file  Social History Narrative  . Not on file   Social Determinants of Health   Financial Resource Strain: Low Risk   . Difficulty of Paying Living Expenses: Not hard at all  Food Insecurity: No Food Insecurity  . Worried About Charity fundraiser in the Last Year: Never true  . Ran Out of Food in the Last Year: Never true  Transportation Needs: No Transportation Needs  . Lack of Transportation (Medical): No  . Lack of  Transportation (Non-Medical): No  Physical Activity: Sufficiently Active  . Days of Exercise per Week: 7 days  . Minutes of Exercise per Session: 30 min  Stress: No Stress Concern Present  . Feeling of Stress : Not at all  Social Connections: Not on file    Tobacco Counseling Counseling given: Not Answered   Clinical Intake:  Pre-visit preparation completed: Yes  Pain : No/denies pain     Nutritional Status: BMI <19  Underweight Nutritional Risks: None Diabetes: No  How often do you need to have someone help you when you read instructions, pamphlets, or other written materials from your doctor or pharmacy?: 1 - Never What is the last grade level you completed in school?: 1 yr graduate school  Diabetic? no  Interpreter Needed?: No  Information entered by :: NAllen LPN   Activities of Daily Living In your present state of health, do you have any difficulty performing the following activities: 02/07/2020  Hearing? 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 N  Difficulty concentrating or making decisions? N  Walking or climbing stairs? N  Dressing or bathing? N  Doing errands, shopping? N  Preparing Food and eating ? N  Using the Toilet? N  In the past six months, have you accidently leaked urine? N  Do you have problems with loss of bowel control? N  Managing your Medications? N  Managing your Finances? N  Housekeeping or managing your Housekeeping? N  Some recent data might be hidden    Patient Care Team: Malfi, Lupita Raider, FNP as PCP - General (Family Medicine)  Indicate any recent Medical Services you may have received from other than [facility] providers in the past year (date may be approximate).     Assessment:   This is a routine wellness examination for [facility].  Hearing/Vision screen No exam data present  Dietary issues and exercise activities discussed: Current Exercise Habits: Home exercise routine, Type of exercise: stretching;walking, Time (Minutes): 30,  Frequency (Times/Week): 7, Weekly Exercise (Minutes/Week): 210  Goals    . DIET - INCREASE WATER INTAKE     Recommend to drink at least 6-8 8oz glasses of water per day.     . Patient Stated     02/07/2020, stay healthy      Depression Screen PHQ 2/9 Scores 02/07/2020 09/26/2019 07/05/2019 12/21/2018 10/29/2018 12/15/2017 11/11/2017  PHQ - 2 Score 0 0 0 0 0 1 0  PHQ- 9 Score - 3 1 - 0 - 5    Fall Risk Fall Risk  02/07/2020 12/21/2018 12/15/2017  Falls in the past year? 0 0 1  Number falls in past yr: - 0 0  Injury with Fall? - 0 1  Comment - - cut on face  Risk for fall due to : No Fall Risks - Other (Comment)  Risk for fall due to: Comment - - Had been drinking alcohol  and was under the influence.   Follow up Falls evaluation completed;Education provided;Falls prevention discussed - -    FALL RISK PREVENTION PERTAINING TO THE HOME:  Any stairs in or around the home? Yes  If so, are there any without handrails? No  Home free of loose throw rugs in walkways, pet beds, electrical cords, etc? Yes  Adequate lighting in your home to reduce risk of falls? Yes   ASSISTIVE DEVICES UTILIZED TO PREVENT FALLS:  Life alert? No  Use of a cane, walker or w/c? No  Grab bars in the bathroom? No  Shower chair or bench in shower? Yes  Elevated toilet seat or a handicapped toilet? No   TIMED UP AND GO:  Was the test performed? No .      Cognitive Function:     6CIT Screen 02/07/2020  What Year? 0 points  What month? 0 points  What time? 0 points  Count back from 20 0 points  Months in reverse 0 points  Repeat phrase 0 points  Total Score 0    Immunizations Immunization History  Administered Date(s) Administered  . Fluad Quad(high Dose 65+) 01/11/2019, 12/14/2019  . Influenza, High Dose Seasonal PF 11/11/2017  . Moderna Sars-Covid-2 Vaccination 04/28/2019, 05/26/2019  . Pneumococcal Conjugate-13 01/11/2019    TDAP status: Up to date  Flu Vaccine status: Up to  date  Pneumococcal vaccine status: Due, Education has been provided regarding the importance of this vaccine. Advised may receive this vaccine at local pharmacy or Health Dept. Aware to provide a copy of the vaccination record if obtained from local pharmacy or Health Dept. Verbalized acceptance and understanding.  Covid-19 vaccine status: Completed vaccines  Qualifies for Shingles Vaccine? Yes   Zostavax completed No   Shingrix Completed?: needs second dose  Screening Tests Health Maintenance  Topic Date Due  . COVID-19 Vaccine (3 - Booster for Moderna series) 11/25/2019  . PNA vac Low Risk Adult (2 of 2 - PPSV23) 01/11/2020  . COLONOSCOPY (Pts 45-106yrs Insurance coverage will need to be confirmed)  10/07/2020  . MAMMOGRAM  08/21/2021  . TETANUS/TDAP  12/16/2022  . INFLUENZA VACCINE  Completed  . DEXA SCAN  Completed  . Hepatitis C Screening  Completed    Health Maintenance  Health Maintenance Due  Topic Date Due  . COVID-19 Vaccine (3 - Booster for Moderna series) 11/25/2019  . PNA vac Low Risk Adult (2 of 2 - PPSV23) 01/11/2020    Colorectal cancer screening: Type of screening: Colonoscopy. Completed 10/08/2010. Repeat every 10 years  Mammogram status: Completed 08/22/2019. Repeat every year  Bone Density status: Completed 08/22/2019. Results reflect: Bone density results: OSTEOPENIA. Repeat every 2 years.  Lung Cancer Screening: (Low Dose CT Chest recommended if Age 58-80 years, 30 pack-year currently smoking OR have quit w/in 15years.) does not qualify.   Lung Cancer Screening Referral: no  Additional Screening:  Hepatitis C Screening: does qualify; Completed 11/22/2015  Vision Screening: Recommended annual ophthalmology exams for early detection of glaucoma and other disorders of the eye. Is the patient up to date with their annual eye exam?  Yes  Who is the provider or what is the name of the office in which the patient attends annual eye exams? [place] If pt is  not established with a provider, would they like to be referred to a provider to establish care? No .   Dental Screening: Recommended annual dental exams for proper oral hygiene  Community Resource Referral / Chronic Care Management: CRR  required this visit?  No   CCM required this visit?  No      Plan:     I have personally reviewed and noted the following in the patient's chart:   . Medical and social history . Use of alcohol, tobacco or illicit drugs  . Current medications and supplements . Functional ability and status . Nutritional status . Physical activity . Advanced directives . List of other physicians . Hospitalizations, surgeries, and ER visits in previous 12 months . Vitals . Screenings to include cognitive, depression, and falls . Referrals and appointments  In addition, I have reviewed and discussed with patient certain preventive protocols, quality metrics, and best practice recommendations. A written personalized care plan for preventive services as well as general preventive health recommendations were provided to patient.     Kellie Simmering, LPN   [id number]   Nurse Notes:

## 2020-02-07 NOTE — Telephone Encounter (Signed)
Patient consented to telehealth visit. 

## 2020-02-07 NOTE — Patient Instructions (Signed)
Tracie Reed , Thank you for taking time to come for your Medicare Wellness Visit. I appreciate your ongoing commitment to your health goals. Please review the following plan we discussed and let me know if I can assist you in the future.   Screening recommendations/referrals: Colonoscopy: completed 10/08/2010, due 10/07/2020 Mammogram: completed 08/22/2019, due 08/21/2020 Bone Density: completed 08/22/2019, due 08/21/2021 Recommended yearly ophthalmology/optometry visit for glaucoma screening and checkup Recommended yearly dental visit for hygiene and checkup  Vaccinations: Influenza vaccine: completed 12/14/2019, due 08/27/2020 Pneumococcal vaccine: due Tdap vaccine: completed 12/15/2012, due 12/16/2022 Shingles vaccine: 1st dose 12/15/2019   Covid-19: 12/19/2019, 05/26/2019, 04/28/2019  Advanced directives: Please bring a copy of your POA (Power of Attorney) and/or Living Will to your next appointment.   Conditions/risks identified: none  Next appointment: Follow up in one year for your annual wellness visit    Preventive Care 65 Years and Older, Female Preventive care refers to lifestyle choices and visits with your health care provider that can promote health and wellness. What does preventive care include?  A yearly physical exam. This is also called an annual well check.  Dental exams once or twice a year.  Routine eye exams. Ask your health care provider how often you should have your eyes checked.  Personal lifestyle choices, including:  Daily care of your teeth and gums.  Regular physical activity.  Eating a healthy diet.  Avoiding tobacco and drug use.  Limiting alcohol use.  Practicing safe sex.  Taking low-dose aspirin every day.  Taking vitamin and mineral supplements as recommended by your health care provider. What happens during an annual well check? The services and screenings done by your health care provider during your annual well check will depend on  your age, overall health, lifestyle risk factors, and family history of disease. Counseling  Your health care provider may ask you questions about your:  Alcohol use.  Tobacco use.  Drug use.  Emotional well-being.  Home and relationship well-being.  Sexual activity.  Eating habits.  History of falls.  Memory and ability to understand (cognition).  Work and work Statistician.  Reproductive health. Screening  You may have the following tests or measurements:  Height, weight, and BMI.  Blood pressure.  Lipid and cholesterol levels. These may be checked every 5 years, or more frequently if you are over 45 years old.  Skin check.  Lung cancer screening. You may have this screening every year starting at age 58 if you have a 30-pack-year history of smoking and currently smoke or have quit within the past 15 years.  Fecal occult blood test (FOBT) of the stool. You may have this test every year starting at age 34.  Flexible sigmoidoscopy or colonoscopy. You may have a sigmoidoscopy every 5 years or a colonoscopy every 10 years starting at age 38.  Hepatitis C blood test.  Hepatitis B blood test.  Sexually transmitted disease (STD) testing.  Diabetes screening. This is done by checking your blood sugar (glucose) after you have not eaten for a while (fasting). You may have this done every 1-3 years.  Bone density scan. This is done to screen for osteoporosis. You may have this done starting at age 35.  Mammogram. This may be done every 1-2 years. Talk to your health care provider about how often you should have regular mammograms. Talk with your health care provider about your test results, treatment options, and if necessary, the need for more tests. Vaccines  Your health care provider may  recommend certain vaccines, such as:  Influenza vaccine. This is recommended every year.  Tetanus, diphtheria, and acellular pertussis (Tdap, Td) vaccine. You may need a Td booster  every 10 years.  Zoster vaccine. You may need this after age 55.  Pneumococcal 13-valent conjugate (PCV13) vaccine. One dose is recommended after age 84.  Pneumococcal polysaccharide (PPSV23) vaccine. One dose is recommended after age 40. Talk to your health care provider about which screenings and vaccines you need and how often you need them. This information is not intended to replace advice given to you by your health care provider. Make sure you discuss any questions you have with your health care provider. Document Released: 02/09/2015 Document Revised: 10/03/2015 Document Reviewed: 11/14/2014 Elsevier Interactive Patient Education  2017 [place] Prevention in the Home Falls can cause injuries. They can happen to people of all ages. There are many things you can do to make your home safe and to help prevent falls. What can I do on the outside of my home?  Regularly fix the edges of walkways and driveways and fix any cracks.  Remove anything that might make you trip as you walk through a door, such as a raised step or threshold.  Trim any bushes or trees on the path to your home.  Use bright outdoor lighting.  Clear any walking paths of anything that might make someone trip, such as rocks or tools.  Regularly check to see if handrails are loose or broken. Make sure that both sides of any steps have handrails.  Any raised decks and porches should have guardrails on the edges.  Have any leaves, snow, or ice cleared regularly.  Use sand or salt on walking paths during winter.  Clean up any spills in your garage right away. This includes oil or grease spills. What can I do in the bathroom?  Use night lights.  Install grab bars by the toilet and in the tub and shower. Do not use towel bars as grab bars.  Use non-skid mats or decals in the tub or shower.  If you need to sit down in the shower, use a plastic, non-slip stool.  Keep the floor dry. Clean up any  water that spills on the floor as soon as it happens.  Remove soap buildup in the tub or shower regularly.  Attach bath mats securely with double-sided non-slip rug tape.  Do not have throw rugs and other things on the floor that can make you trip. What can I do in the bedroom?  Use night lights.  Make sure that you have a light by your bed that is easy to reach.  Do not use any sheets or blankets that are too big for your bed. They should not hang down onto the floor.  Have a firm chair that has side arms. You can use this for support while you get dressed.  Do not have throw rugs and other things on the floor that can make you trip. What can I do in the kitchen?  Clean up any spills right away.  Avoid walking on wet floors.  Keep items that you use a lot in easy-to-reach places.  If you need to reach something above you, use a strong step stool that has a grab bar.  Keep electrical cords out of the way.  Do not use floor polish or wax that makes floors slippery. If you must use wax, use non-skid floor wax.  Do not have throw rugs and  other things on the floor that can make you trip. What can I do with my stairs?  Do not leave any items on the stairs.  Make sure that there are handrails on both sides of the stairs and use them. Fix handrails that are broken or loose. Make sure that handrails are as long as the stairways.  Check any carpeting to make sure that it is firmly attached to the stairs. Fix any carpet that is loose or worn.  Avoid having throw rugs at the top or bottom of the stairs. If you do have throw rugs, attach them to the floor with carpet tape.  Make sure that you have a light switch at the top of the stairs and the bottom of the stairs. If you do not have them, ask someone to add them for you. What else can I do to help prevent falls?  Wear shoes that:  Do not have high heels.  Have rubber bottoms.  Are comfortable and fit you well.  Are closed  at the toe. Do not wear sandals.  If you use a stepladder:  Make sure that it is fully opened. Do not climb a closed stepladder.  Make sure that both sides of the stepladder are locked into place.  Ask someone to hold it for you, if possible.  Clearly mark and make sure that you can see:  Any grab bars or handrails.  First and last steps.  Where the edge of each step is.  Use tools that help you move around (mobility aids) if they are needed. These include:  Canes.  Walkers.  Scooters.  Crutches.  Turn on the lights when you go into a dark area. Replace any light bulbs as soon as they burn out.  Set up your furniture so you have a clear path. Avoid moving your furniture around.  If any of your floors are uneven, fix them.  If there are any pets around you, be aware of where they are.  Review your medicines with your doctor. Some medicines can make you feel dizzy. This can increase your chance of falling. Ask your doctor what other things that you can do to help prevent falls. This information is not intended to replace advice given to you by your health care provider. Make sure you discuss any questions you have with your health care provider. Document Released: 11/09/2008 Document Revised: 06/21/2015 Document Reviewed: 02/17/2014 Elsevier Interactive Patient Education  2017 [employer].

## 2020-02-10 ENCOUNTER — Other Ambulatory Visit: Payer: Self-pay | Admitting: Family Medicine

## 2020-02-10 DIAGNOSIS — R059 Cough, unspecified: Secondary | ICD-10-CM

## 2020-02-10 NOTE — Telephone Encounter (Signed)
I left a very detail message on the patient voicemail that she is due for her pneumonia 23 vaccine.

## 2020-02-10 NOTE — Telephone Encounter (Signed)
Requested Prescriptions  Pending Prescriptions Disp Refills  . cetirizine (ZYRTEC) 10 MG tablet [Pharmacy Med Name: CETIRIZINE 10MG  TABLETS] 30 tablet 0    Sig: TAKE 1 TABLET(10 MG) BY MOUTH DAILY     Ear, Nose, and Throat:  Antihistamines Passed - 02/10/2020  2:52 PM      Passed - Valid encounter within last 12 months    Recent Outpatient Visits          1 month ago Right arm pain   [place], FNP   1 month ago [place], FNP   4 months ago [facility] Malfi, Lupita Raider, FNP   6 months ago [facility], Lupita Raider, FNP   7 months ago Prediabetes   [facility], Lupita Raider, FNP      Future Appointments            In 1 week [place], Lupita Raider, [facility], [place]   In 1 year  [facility], [facility]

## 2020-02-10 NOTE — Telephone Encounter (Signed)
Copied from [facility] [id number]. Topic: Appointment Scheduling - Scheduling Inquiry for Clinic >> Feb 10, 2020  2:48 PM Lennox Solders wrote: Reason for CRM: Pt is calling and would like to know if she is due for PNA vaccine

## 2020-02-21 ENCOUNTER — Ambulatory Visit (INDEPENDENT_AMBULATORY_CARE_PROVIDER_SITE_OTHER): Payer: Medicare Other

## 2020-02-21 ENCOUNTER — Ambulatory Visit: Payer: Medicare Other | Admitting: Family Medicine

## 2020-02-21 ENCOUNTER — Other Ambulatory Visit: Payer: Self-pay

## 2020-02-21 DIAGNOSIS — Z23 Encounter for immunization: Secondary | ICD-10-CM | POA: Diagnosis not present

## 2020-03-12 ENCOUNTER — Other Ambulatory Visit: Payer: Self-pay

## 2020-03-12 ENCOUNTER — Ambulatory Visit: Payer: Self-pay | Admitting: Family Medicine

## 2020-03-12 ENCOUNTER — Ambulatory Visit: Payer: Self-pay

## 2020-03-12 DIAGNOSIS — R059 Cough, unspecified: Secondary | ICD-10-CM

## 2020-03-12 MED ORDER — CETIRIZINE HCL 10 MG PO TABS: 10.0000 mg | ORAL_TABLET | Freq: Every day | ORAL | 3 refills | Status: DC

## 2020-03-12 NOTE — Telephone Encounter (Signed)
Pt scheduled for a mychart virtual visit on tomorrow for the eye irritiation. I offered the patient a face to face visit but she preferred a virtual visit.   She also requested a refill on her zyrtec 10MG  once daily.

## 2020-03-12 NOTE — Telephone Encounter (Signed)
Pt is calling to speak with [facility]. PT was given an appt for 03/20/20. Pt was requesting a sooner appt for possible pink eye. Pt was advised 10:20 for tomorrow. Please advise Cb- [phone]

## 2020-03-12 NOTE — Telephone Encounter (Signed)
Attempted to contact pt to discuss symptoms; left message on voicemail.

## 2020-03-12 NOTE — Telephone Encounter (Signed)
Copied from [place] [id number]. Topic: General - Other >> Mar 12, 2020  8:15 AM Leward Quan A wrote: Reason for CRM: Patient called in to inform Dr Raliegh Ip that per her symptoms she have pink in her right eye that include stinging and burning of right eye itchy red gritty and watery. Asking if there is anything that can be prescribed please and call her at Ph# [phone]

## 2020-03-12 NOTE — Telephone Encounter (Signed)
Patient called, left VM to return the call to the office to speak to a [place]. She initially called to schedule appointment for possible pink eye, appointment given 03/20/20 and she would like an earlier appointment.

## 2020-03-12 NOTE — Telephone Encounter (Signed)
See Triage encounter. Will triage possible pink eye symptoms.

## 2020-03-13 ENCOUNTER — Other Ambulatory Visit: Payer: Self-pay

## 2020-03-13 ENCOUNTER — Telehealth (INDEPENDENT_AMBULATORY_CARE_PROVIDER_SITE_OTHER): Payer: Medicare Other | Admitting: Unknown Physician Specialty

## 2020-03-13 DIAGNOSIS — B309 Viral conjunctivitis, unspecified: Secondary | ICD-10-CM

## 2020-03-13 MED ORDER — ERYTHROMYCIN 5 MG/GM OP OINT
1.0000 | TOPICAL_OINTMENT | Freq: Three times a day (TID) | OPHTHALMIC | 0 refills | Status: DC
Start: 2020-03-13 — End: 2020-07-19

## 2020-03-13 NOTE — Progress Notes (Signed)
There were no vitals taken for this visit.   Subjective:    Patient ID: Tracie Reed, female    DOB: March 22, 1945, 75 y.o.   MRN: [id number]  HPI: Tracie Reed is a 75 y.o. female  Chief Complaint  Patient presents with  . Conjunctivitis   Due to the catastrophic nature of the COVID-19 pandemic, this visit was completed via audio and visual contact via Caregility due to the restrictions of the COVID-19 pandemic. All issues as above were discussed and addressed. Physical exam was done as above through visual confirmation on Caregility. If it was felt that the patient should be evaluated in the office, they were directed there. The patient verbally consented to this visit."} . Location of the patient: home . Location of the provider: work . Those involved with this call:  . Provider: Kathrine Haddock, DNP . CMA: Veryl Speak . Front Desk/Registration: Apolonio Schneiders  . Time spent on call: 10 minutes with patient face to face via video conference. More than 50% of this time was spent in counseling and coordination of care. 5 minutes total spent in review of patient's record and preparation of their chart.  I verified patient identity using two factors (patient name and date of birth). Patient consents verbally to being seen via telemedicine visit today.  Conjunctivitis  The current episode started 3 to 5 days ago. The onset was gradual. The problem has been gradually improving. The problem is mild. Associated symptoms include decreased vision, eye itching and eye redness. Pertinent negatives include no fever, no double vision, no photophobia, no congestion, no ear discharge, no ear pain, no headaches, no hearing loss, no mouth sores, no rhinorrhea, no sore throat, no stridor, no swollen glands, no eye discharge and no eye pain.    Relevant past medical, surgical, family and social history reviewed and updated as indicated. Interim medical history since our last visit reviewed. Allergies and  medications reviewed and updated.  Review of Systems  Constitutional: Negative for fever.  HENT: Negative for congestion, ear discharge, ear pain, hearing loss, mouth sores, rhinorrhea and sore throat.   Eyes: Positive for redness and itching. Negative for double vision, photophobia, pain and discharge.  Respiratory: Negative for stridor.   Neurological: Negative for headaches.    Per HPI unless specifically indicated above     Objective:    There were no vitals taken for this visit.  Wt Readings from Last 3 Encounters:  02/07/20 115 lb (52.2 kg)  12/27/19 119 lb (54 kg)  12/14/19 119 lb 9.6 oz (54.3 kg)    Physical Exam Constitutional:      General: She is not in acute distress.    Appearance: Normal appearance. She is well-developed and well-nourished.  HENT:     Head: Normocephalic and atraumatic.  Eyes:     General: Lids are normal. No scleral icterus.       Right eye: No discharge.        Left eye: No discharge.     Conjunctiva/sclera: Conjunctivae normal.  Cardiovascular:     Rate and Rhythm: Normal rate.  Pulmonary:     Effort: Pulmonary effort is normal.  Abdominal:     Palpations: There is no hepatomegaly or splenomegaly.  Musculoskeletal:        General: Normal range of motion.  Skin:    General: Skin is intact.     Coloration: Skin is not pale.     Findings: No rash.  Neurological:     Mental Status:  She is alert and oriented to person, place, and time.  Psychiatric:        Mood and Affect: Mood and affect normal.        Behavior: Behavior normal.        Thought Content: Thought content normal.        Judgment: Judgment normal.    Unable to fully see eye for assessment  Results for orders placed or performed in visit on 07/05/19  CBC with Differential  Result Value Ref Range   WBC 7.0 3.8 - 10.8 Thousand/uL   RBC 4.74 3.80 - 5.10 Million/uL   Hemoglobin 15.1 11.7 - 15.5 g/dL   HCT 44.5 35.0 - 45.0 %   MCV 93.9 80.0 - 100.0 fL   MCH 31.9 27.0 -  33.0 pg   MCHC 33.9 32.0 - 36.0 g/dL   RDW 13.2 11.0 - 15.0 %   Platelets 252 140 - 400 Thousand/uL   MPV 10.6 7.5 - 12.5 fL   Neutro Abs 4,760 1,500 - 7,800 cells/uL   Lymphs Abs 1,477 850 - 3,900 cells/uL   Absolute Monocytes 672 200 - 950 cells/uL   Eosinophils Absolute 28 15 - 500 cells/uL   Basophils Absolute 63 0 - 200 cells/uL   Neutrophils Relative % 68 %   Total Lymphocyte 21.1 %   Monocytes Relative 9.6 %   Eosinophils Relative 0.4 %   Basophils Relative 0.9 %  COMPLETE METABOLIC PANEL WITH GFR  Result Value Ref Range   Glucose, Bld 122 (H) 65 - 99 mg/dL   BUN 8 7 - 25 mg/dL   Creat 0.67 0.60 - 0.93 mg/dL   GFR, Est Non African American 87 > OR = 60 mL/min/1.89m2   GFR, Est African American 101 > OR = 60 mL/min/1.7m2   BUN/Creatinine Ratio NOT APPLICABLE 6 - 22 (calc)   Sodium 140 135 - 146 mmol/L   Potassium 4.3 3.5 - 5.3 mmol/L   Chloride 103 98 - 110 mmol/L   CO2 27 20 - 32 mmol/L   Calcium 9.9 8.6 - 10.4 mg/dL   Total Protein 6.5 6.1 - 8.1 g/dL   Albumin 4.7 3.6 - 5.1 g/dL   Globulin 1.8 (L) 1.9 - 3.7 g/dL (calc)   AG Ratio 2.6 (H) 1.0 - 2.5 (calc)   Total Bilirubin 0.5 0.2 - 1.2 mg/dL   Alkaline phosphatase (APISO) 58 37 - 153 U/L   AST 18 10 - 35 U/L   ALT 15 6 - 29 U/L  Lipid Profile  Result Value Ref Range   Cholesterol 233 (H) <200 mg/dL   HDL 106 > OR = 50 mg/dL   Triglycerides 45 <150 mg/dL   LDL Cholesterol (Calc) 114 (H) mg/dL (calc)   Total CHOL/HDL Ratio 2.2 <5.0 (calc)   Non-HDL Cholesterol (Calc) 127 <130 mg/dL (calc)  Thyroid Panel With TSH  Result Value Ref Range   T3 Uptake 36 (H) 22 - 35 %   T4, Total 4.1 (L) 5.1 - 11.9 mcg/dL   Free Thyroxine Index 1.5 1.4 - 3.8   TSH 2.91 0.40 - 4.50 mIU/L  Hemoglobin A1C w/out eAG  Result Value Ref Range   Hgb A1c MFr Bld 5.2 <5.7 % of total Hgb  POCT Urinalysis Dipstick  Result Value Ref Range   Color, UA Yellow    Clarity, UA clear    Glucose, UA Negative Negative   Bilirubin, UA negative     Ketones, UA negative    Spec Grav, UA <=1.005 (  A) 1.010 - 1.025   Blood, UA negative    pH, UA 7.0 5.0 - 8.0   Protein, UA Negative Negative   Urobilinogen, UA 0.2 0.2 or 1.0 E.U./dL   Nitrite, UA negative    Leukocytes, UA Negative Negative   Appearance     Odor        Assessment & Plan:   Problem List Items Addressed This Visit   None   Visit Diagnoses    Acute viral conjunctivitis of right eye    -  Primary   Seems to be viral.  Rx for Erythromyacin optic to take if worsening.  If blurriness persists, visit eye professional        Follow up plan: Return if symptoms worsen or fail to improve.

## 2020-03-20 ENCOUNTER — Telehealth: Payer: Medicare Other | Admitting: Family Medicine

## 2020-06-12 ENCOUNTER — Other Ambulatory Visit: Payer: Self-pay

## 2020-06-12 DIAGNOSIS — F5104 Psychophysiologic insomnia: Secondary | ICD-10-CM

## 2020-06-12 DIAGNOSIS — F419 Anxiety disorder, unspecified: Secondary | ICD-10-CM

## 2020-06-12 NOTE — Telephone Encounter (Signed)
  Notes to clinic: Patient last seen by Cyndia Skeeters and has not seen Webb Silversmith yet  Review for refill    Requested Prescriptions  Pending Prescriptions Disp Refills   traZODone (DESYREL) 50 MG tablet 180 tablet 1    Sig: Take 1-2 tablets (50-100 mg total) by mouth at bedtime as needed for sleep.      Psychiatry: Antidepressants - Serotonin Modulator Passed - 06/12/2020 12:44 PM      Passed - Valid encounter within last 6 months    Recent Outpatient Visits           3 months ago Acute viral conjunctivitis of right eye   [facility] Kathrine Haddock, NP   5 months ago Right arm pain   [facility], Lupita Raider, FNP   6 months ago [facility] [place], Lupita Raider, FNP   8 months ago [place], FNP   10 months ago [place], FNP       Future Appointments             In 8 months [facility], [facility]                FLUoxetine (PROZAC) 10 MG tablet 90 tablet 1    Sig: Take 1 tablet (10 mg total) by mouth daily.      Psychiatry:  Antidepressants - SSRI Passed - 06/12/2020 12:44 PM      Passed - Valid encounter within last 6 months    Recent Outpatient Visits           3 months ago Acute viral conjunctivitis of right eye   [place], NP   5 months ago Right arm pain   [facility], Lupita Raider, FNP   6 months ago [place], FNP   8 months ago [facility] [place], Lupita Raider, FNP   10 months ago [facility] [place], Lupita Raider, FNP       Future Appointments             In 8 months [facility], [facility]

## 2020-06-13 MED ORDER — TRAZODONE HCL 50 MG PO TABS: 50.0000 mg | ORAL_TABLET | Freq: Every evening | ORAL | 0 refills | Status: DC | PRN

## 2020-06-13 MED ORDER — FLUOXETINE HCL 10 MG PO TABS: 10.0000 mg | ORAL_TABLET | Freq: Every day | ORAL | 0 refills | Status: DC

## 2020-06-13 NOTE — Telephone Encounter (Signed)
Can you have her set up an appt with me for follow up of chronic conditions

## 2020-07-10 ENCOUNTER — Telehealth: Payer: Self-pay | Admitting: Family Medicine

## 2020-07-10 NOTE — Telephone Encounter (Signed)
Patient called to request a referral for a mammogram and a bone density at the [facility].  Patient stated her last mammo was 08/21/2020, so it would have to be a year out from that date for insurance purposes.  Please advise and call patient to confirm at [phone]

## 2020-07-10 NOTE — Telephone Encounter (Signed)
She is due for her annual exam 07/19/20. Have her schedule this and orders will placed at the visit.

## 2020-07-12 DIAGNOSIS — H524 Presbyopia: Secondary | ICD-10-CM | POA: Diagnosis not present

## 2020-07-19 ENCOUNTER — Encounter: Payer: Self-pay | Admitting: Internal Medicine

## 2020-07-19 ENCOUNTER — Ambulatory Visit
Admission: RE | Admit: 2020-07-19 | Discharge: 2020-07-19 | Disposition: A | Discharge status: Home / Self Care | Payer: Medicare Other | Source: Ambulatory Visit | Attending: Internal Medicine | Admitting: Internal Medicine

## 2020-07-19 ENCOUNTER — Ambulatory Visit
Admission: RE | Admit: 2020-07-19 | Discharge: 2020-07-19 | Disposition: A | Discharge status: Home / Self Care | Payer: Medicare Other | Attending: Internal Medicine | Admitting: Internal Medicine

## 2020-07-19 ENCOUNTER — Ambulatory Visit (INDEPENDENT_AMBULATORY_CARE_PROVIDER_SITE_OTHER): Payer: Medicare Other | Admitting: Internal Medicine

## 2020-07-19 ENCOUNTER — Other Ambulatory Visit: Payer: Self-pay

## 2020-07-19 VITALS — BP 102/59 | HR 73 | Temp 97.8°F | Resp 18 | Ht 67.0 in | Wt 127.2 lb

## 2020-07-19 DIAGNOSIS — M25532 Pain in left wrist: Secondary | ICD-10-CM

## 2020-07-19 DIAGNOSIS — F5104 Psychophysiologic insomnia: Secondary | ICD-10-CM

## 2020-07-19 DIAGNOSIS — Z1231 Encounter for screening mammogram for malignant neoplasm of breast: Secondary | ICD-10-CM | POA: Diagnosis not present

## 2020-07-19 DIAGNOSIS — H9312 Tinnitus, left ear: Secondary | ICD-10-CM | POA: Diagnosis not present

## 2020-07-19 DIAGNOSIS — F419 Anxiety disorder, unspecified: Secondary | ICD-10-CM

## 2020-07-19 DIAGNOSIS — B001 Herpesviral vesicular dermatitis: Secondary | ICD-10-CM

## 2020-07-19 DIAGNOSIS — Z1211 Encounter for screening for malignant neoplasm of colon: Secondary | ICD-10-CM | POA: Diagnosis not present

## 2020-07-19 DIAGNOSIS — H9193 Unspecified hearing loss, bilateral: Secondary | ICD-10-CM

## 2020-07-19 DIAGNOSIS — M8588 Other specified disorders of bone density and structure, other site: Secondary | ICD-10-CM

## 2020-07-19 DIAGNOSIS — Z0001 Encounter for general adult medical examination with abnormal findings: Secondary | ICD-10-CM

## 2020-07-19 LAB — COMPLETE METABOLIC PANEL WITH GFR
AG Ratio: 2.3 (calc) (ref 1.0–2.5)
ALT: 13 U/L (ref 6–29)
AST: 15 U/L (ref 10–35)
Albumin: 4.6 g/dL (ref 3.6–5.1)
Alkaline phosphatase (APISO): 64 U/L (ref 37–153)
BUN: 10 mg/dL (ref 7–25)
CO2: 29 mmol/L (ref 20–32)
Calcium: 9.5 mg/dL (ref 8.6–10.4)
Chloride: 103 mmol/L (ref 98–110)
Creat: 0.67 mg/dL (ref 0.60–0.93)
GFR, Est African American: 100 mL/min/{1.73_m2} (ref >=60)
GFR, Est Non African American: 86 mL/min/{1.73_m2} (ref >=60)
Globulin: 2 g/dL (calc) (ref 1.9–3.7)
Glucose, Bld: 80 mg/dL (ref 65–99)
Potassium: 4.5 mmol/L (ref 3.5–5.3)
Sodium: 139 mmol/L (ref 135–146)
Total Bilirubin: 0.4 mg/dL (ref 0.2–1.2)
Total Protein: 6.6 g/dL (ref 6.1–8.1)

## 2020-07-19 LAB — CBC
HCT: 42.3 % (ref 35.0–45.0)
Hemoglobin: 13.6 g/dL (ref 11.7–15.5)
MCH: 29.3 pg (ref 27.0–33.0)
MCHC: 32.2 g/dL (ref 32.0–36.0)
MCV: 91.2 fL (ref 80.0–100.0)
MPV: 10.4 fL (ref 7.5–12.5)
Platelets: 239 10*3/uL (ref 140–400)
RBC: 4.64 10*6/uL (ref 3.80–5.10)
RDW: 13 % (ref 11.0–15.0)
WBC: 7.7 10*3/uL (ref 3.8–10.8)

## 2020-07-19 LAB — LIPID PANEL
Cholesterol: 230 mg/dL — ABNORMAL HIGH (ref <200)
HDL: 59 mg/dL (ref >=50)
LDL Cholesterol (Calc): 151 mg/dL (calc) — ABNORMAL HIGH
Non-HDL Cholesterol (Calc): 171 mg/dL (calc) — ABNORMAL HIGH (ref <130)
Total CHOL/HDL Ratio: 3.9 (calc) (ref <5.0)
Triglycerides: 91 mg/dL (ref <150)

## 2020-07-19 NOTE — Assessment & Plan Note (Signed)
Controlled on Trazadone Will monitor

## 2020-07-19 NOTE — Progress Notes (Signed)
Subjective:    Patient ID: Tracie Reed, female    DOB: 1945-04-17, 75 y.o.   MRN: [id number]  HPI  Patient presents to clinic today for her annual exam.  She had her Medicare wellness exam 01/2020.  She is also due to follow-up chronic conditions.  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  She takes Valacyclovir and Acyclovir ointment as needed with good relief of symptoms.  Osteopenia: Her bone density from 07/2019 reviewed.  She is taking Calcium and Vitamin D OTC.  She tries to get 30 minutes of weightbearing exercise daily.  Anxiety: Chronic, managed on Fluoxetine.  She is not currently seeing a therapist.  She denies depression, SI/HI.  Insomnia: She has difficulty staying asleep.  She takes Trazodone as prescribed with good results.  There is no sleep study on file.  She also reports intermittent left wrist pain. She noticed this 7 months ago. She describes the pain as sore and achy. The pain is worse with movement and lifting things. She denies numbness and tingling in her left hand. She has taken Ibuprofen OTC as needed with good relief.  Flu: 11/2019 Tetanus:unsure COVID: Moderna x4 Pneumovax: 01/2020  Prevnar: 12/2018 Zostavax: Never Shingrix: 11/2019 Pap smear: no longer screening Mammogram: 07/2019 Bone density: 07/2019 Colon screening: 2012 Vision screening: annually Dentist: biannually  Diet: She does not eat meat. She consumes fruits and veggies. She tries to avoid fried foods. She drinks mostly water.   Exercise: Lifting weights  Review of Systems     Past Medical History:  Diagnosis Date   Anemia     Current Outpatient Medications  Medication Sig Dispense Refill   acyclovir ointment (ZOVIRAX) 5 % Apply 1 application topically every 3 (three) hours as needed. 15 g 1   calcium carbonate (OS-CAL) 1250 (500 Ca) MG chewable tablet Chew 1 tablet by mouth daily.     cetirizine (ZYRTEC) 10 MG tablet Take 1 tablet (10 mg total) by mouth daily. 90  tablet 3   Diclofenac Sodium 1 % CREA Apply 1 application topically 4 (four) times daily as needed (right arm pain). (Patient not taking: Reported on 02/07/2020) 120 g 1   erythromycin ophthalmic ointment Place 1 application into both eyes 3 (three) times daily. 3.5 g 0   FLUoxetine (PROZAC) 10 MG tablet Take 1 tablet (10 mg total) by mouth daily. 90 tablet 0   latanoprost (XALATAN) 0.005 % ophthalmic solution Administer 1 drop to both eyes nightly.     traZODone (DESYREL) 50 MG tablet Take 1-2 tablets (50-100 mg total) by mouth at bedtime as needed for sleep. 180 tablet 0   valACYclovir (VALTREX) 1000 MG tablet Take 2 tablets (2,000 mg) at the start of your cold sore.  Take 1 tablet (1,000 mg) daily until sores have resolved. 15 tablet 1   No current facility-administered medications for this visit.    No Known Allergies  Family History  Problem Relation Age of Onset   Depression Mother    Healthy Brother    Breast cancer Neg Hx     Social History   Socioeconomic History   Marital status: Significant Other    Spouse name: Not on file   Number of children: 2   Years of education: Not on file   Highest education level: Bachelor's degree (e.g., BA, AB, BS)  Occupational History   Occupation: self employed    Comment: Holiday representative  Tobacco Use   Smoking status: Former  Pack years: 0.00    Types: Cigarettes    Quit date: 11/12/1987    Years since quitting: 32.7   Smokeless tobacco: Never  Vaping Use   Vaping Use: Never used  Substance and Sexual Activity   Alcohol use: Not Currently   Drug use: Not Currently    Comment: past marijuana - very remote use   Sexual activity: Yes  Other Topics Concern   Not on file  Social History Narrative   Not on file   Social Determinants of Health   Financial Resource Strain: Low Risk    Difficulty of Paying Living Expenses: Not hard at all  Food Insecurity: No Food Insecurity   Worried About Ship broker in the Last Year: Never true   Ran Out of Food in the Last Year: Never true  Transportation Needs: No Transportation Needs   Lack of Transportation (Medical): No   Lack of Transportation (Non-Medical): No  Physical Activity: Sufficiently Active   Days of Exercise per Week: 7 days   Minutes of Exercise per Session: 30 min  Stress: No Stress Concern Present   Feeling of Stress : Not at all  Social Connections: Not on file  Intimate Partner Violence: Not on file     Constitutional: Denies fever, malaise, fatigue, headache or abrupt weight changes.  HEENT: Pt reports decreased hearing out of both ears, ringing in her left ear. Denies eye pain, eye redness, ear pain, wax buildup, runny nose, nasal congestion, bloody nose, or sore throat. Respiratory: Denies difficulty breathing, shortness of breath, cough or sputum production.   Cardiovascular: Denies chest pain, chest tightness, palpitations or swelling in the hands or feet.  Gastrointestinal: Denies abdominal pain, bloating, constipation, diarrhea or blood in the stool.  GU: Pt reports fullness in the vagina. Denies urgency, frequency, pain with urination, burning sensation, blood in urine, odor or discharge. Musculoskeletal: Pt reports left wrist pain. Denies decrease in range of motion, difficulty with gait, muscle pain or joint swelling.  Skin: Denies redness, rashes, lesions or ulcercations.  Neurological: Patient reports insomnia.  Denies dizziness, difficulty with memory, difficulty with speech or problems with balance and coordination.  Psych: Patient has a history of anxiety.  Denies depression, SI/HI.  No other specific complaints in a complete review of systems (except as listed in HPI above).  Objective:   Physical Exam BP (!) 102/59 (BP Location: Right Arm, Patient Position: Sitting, Cuff Size: Small)   Pulse 73   Temp 97.8 F (36.6 C) (Temporal)   Resp 18   Ht 5' 7"  (1.702 m)   Wt 127 lb 3.2 oz (57.7 kg)   SpO2  99%   BMI 19.92 kg/m   Wt Readings from Last 3 Encounters:  02/07/20 115 lb (52.2 kg)  12/27/19 119 lb (54 kg)  12/14/19 119 lb 9.6 oz (54.3 kg)    General: Appears her stated age, well developed, well nourished in NAD. Skin: Warm, dry and intact. No rashes noted. HEENT: Head: normal shape and size; Eyes: sclera white and EOMs intact; Ears: Tm's gray and intact, normal light reflex, slight wax buildup bilaterally, abnormal Weber (lateralizes to the right), normal Rinne;  Neck:  Neck supple, trachea midline. No masses, lumps or thyromegaly present.  Cardiovascular: Normal rate and rhythm. S1,S2 noted.  No murmur, rubs or gallops noted. No JVD or BLE edema. No carotid bruits noted. Pulmonary/Chest: Normal effort and positive vesicular breath sounds. No respiratory distress. No wheezes, rales or ronchi noted.  Abdomen: Soft  and nontender. Normal bowel sounds. No distention or masses noted. Liver, spleen and kidneys non palpable. Pelvic: Normal female anatomy. No evidence of bladder or uterine prolapse. Musculoskeletal: Normal flexion, extension and rotation of the left wrist. No signs of joint swelling. No pain with palpation of the left wrist. Strength 5/5 BUE/BL. Hand grips equal. No difficulty with gait.  Neurological: Alert and oriented. Cranial nerves II-XII grossly intact. Coordination normal.  Psychiatric: Mood and affect normal. Behavior is normal. Judgment and thought content normal.    BMET    Component Value Date/Time   NA 140 07/05/2019 1041   K 4.3 07/05/2019 1041   CL 103 07/05/2019 1041   CO2 27 07/05/2019 1041   GLUCOSE 122 (H) 07/05/2019 1041   BUN 8 07/05/2019 1041   CREATININE 0.67 07/05/2019 1041   CALCIUM 9.9 07/05/2019 1041   GFRNONAA 87 07/05/2019 1041   GFRAA 101 07/05/2019 1041    Lipid Panel     Component Value Date/Time   CHOL 233 (H) 07/05/2019 1041   TRIG 45 07/05/2019 1041   HDL 106 07/05/2019 1041   CHOLHDL 2.2 07/05/2019 1041   LDLCALC 114 (H)  07/05/2019 1041    CBC    Component Value Date/Time   WBC 7.0 07/05/2019 1041   RBC 4.74 07/05/2019 1041   HGB 15.1 07/05/2019 1041   HCT 44.5 07/05/2019 1041   PLT 252 07/05/2019 1041   MCV 93.9 07/05/2019 1041   MCH 31.9 07/05/2019 1041   MCHC 33.9 07/05/2019 1041   RDW 13.2 07/05/2019 1041   LYMPHSABS 1,477 07/05/2019 1041   EOSABS 28 07/05/2019 1041   BASOSABS 63 07/05/2019 1041    Hgb A1C Lab Results  Component Value Date   HGBA1C 5.2 07/05/2019          Assessment & Plan:   Preventative Health Maintenance:  Encouraged her to get a flu shot in the fall She declines tetanus for financial reasons, advised her if she gets better cut she will need to get this done Encouraged her to get her COVID booster Pneumovax and Prevnar UTD Advised her to get her second Shingrix vaccine at the pharmacy She no longer wants to screen for cervical cancer Mammogram due 07/2020 Referral to GI for colon cancer screening Encouraged her to consume a balanced diet and exercise regimen Advised him to see an eye doctor and dentist annually We will check CBC, c-Met and lipid profile today  Decrease Hearing Bilaterally, Tinnitus Left Ear:  Failed in office hearing test Referral to audiology for further evaluation  Left  Wrist Pain:  Xray left wrist today  RTC in 1 year, sooner if needed Webb Silversmith, NP This visit occurred during the SARS-CoV-2 public health emergency.  Safety protocols were in place, including screening questions prior to the visit, additional usage of staff PPE, and extensive cleaning of exam room while observing appropriate contact time as indicated for disinfecting solutions.

## 2020-07-19 NOTE — Assessment & Plan Note (Signed)
Continue Valacyclovir and Acyclovir ointment as needed

## 2020-07-19 NOTE — Assessment & Plan Note (Signed)
Stable on her current dose of Fluoxetine Support offered

## 2020-07-19 NOTE — Patient Instructions (Signed)

## 2020-07-19 NOTE — Assessment & Plan Note (Signed)
Continue Calcium and Vit D Encouraged daily weight bearing exercise

## 2020-07-23 ENCOUNTER — Other Ambulatory Visit: Payer: Self-pay

## 2020-07-23 ENCOUNTER — Telehealth: Payer: Self-pay

## 2020-07-23 DIAGNOSIS — Z1211 Encounter for screening for malignant neoplasm of colon: Secondary | ICD-10-CM

## 2020-07-23 DIAGNOSIS — E7800 Pure hypercholesterolemia, unspecified: Secondary | ICD-10-CM

## 2020-07-23 MED ORDER — SIMVASTATIN 10 MG PO TABS
10.0000 mg | ORAL_TABLET | Freq: Every day | ORAL | 2 refills | Status: DC
Start: 2020-07-23 — End: 2020-08-02

## 2020-07-23 MED ORDER — NA SULFATE-K SULFATE-MG SULF 17.5-3.13-1.6 GM/177ML PO SOLN: 354.0000 mL | Freq: Once | ORAL | 0 refills | Status: AC

## 2020-07-23 NOTE — Telephone Encounter (Signed)
Patient would like to start on the medication for cholesterol. Patient would like to know the name of medication as well.

## 2020-07-23 NOTE — Addendum Note (Signed)
Addended by: Jearld Fenton on: 07/23/2020 04:12 PM   Modules accepted: Orders

## 2020-07-23 NOTE — Telephone Encounter (Signed)
Simvastatin. Will need repeat cmet and lipin in 3 months, lab only.

## 2020-07-24 DIAGNOSIS — H2189 Other specified disorders of iris and ciliary body: Secondary | ICD-10-CM | POA: Diagnosis not present

## 2020-07-24 DIAGNOSIS — H40033 Anatomical narrow angle, bilateral: Secondary | ICD-10-CM | POA: Diagnosis not present

## 2020-07-24 DIAGNOSIS — H2513 Age-related nuclear cataract, bilateral: Secondary | ICD-10-CM | POA: Diagnosis not present

## 2020-07-27 ENCOUNTER — Encounter: Payer: Self-pay | Admitting: Gastroenterology

## 2020-07-31 ENCOUNTER — Ambulatory Visit: Payer: Self-pay

## 2020-07-31 ENCOUNTER — Encounter: Payer: Self-pay | Admitting: Gastroenterology

## 2020-07-31 ENCOUNTER — Other Ambulatory Visit: Payer: Self-pay

## 2020-07-31 ENCOUNTER — Ambulatory Visit
Admission: RE | Admit: 2020-07-31 | Discharge: 2020-07-31 | Disposition: A | Discharge status: Home / Self Care | Payer: Medicare Other | Attending: Gastroenterology | Admitting: Gastroenterology

## 2020-07-31 ENCOUNTER — Encounter
Admission: RE | Disposition: A | Discharge status: Home / Self Care | Payer: Self-pay | Source: Home / Self Care | Attending: Gastroenterology

## 2020-07-31 ENCOUNTER — Telehealth: Payer: Self-pay | Admitting: Internal Medicine

## 2020-07-31 DIAGNOSIS — Z1211 Encounter for screening for malignant neoplasm of colon: Secondary | ICD-10-CM | POA: Insufficient documentation

## 2020-07-31 DIAGNOSIS — K5730 Diverticulosis of large intestine without perforation or abscess without bleeding: Secondary | ICD-10-CM | POA: Diagnosis not present

## 2020-07-31 DIAGNOSIS — K640 First degree hemorrhoids: Secondary | ICD-10-CM | POA: Insufficient documentation

## 2020-07-31 DIAGNOSIS — Z79899 Other long term (current) drug therapy: Secondary | ICD-10-CM | POA: Insufficient documentation

## 2020-07-31 DIAGNOSIS — Z87891 Personal history of nicotine dependence: Secondary | ICD-10-CM | POA: Diagnosis not present

## 2020-07-31 HISTORY — DX: Prediabetes: R73.03

## 2020-07-31 HISTORY — DX: Anxiety disorder, unspecified: F41.9

## 2020-07-31 HISTORY — PX: COLONOSCOPY WITH PROPOFOL: SHX5780

## 2020-07-31 SURGERY — COLONOSCOPY WITH PROPOFOL
Anesthesia: General

## 2020-07-31 MED ORDER — SODIUM CHLORIDE 0.9 % IV SOLN
INTRAVENOUS | Status: DC
Start: 2020-07-31 — End: 2020-07-31

## 2020-07-31 MED ORDER — PROPOFOL 10 MG/ML IV BOLUS
INTRAVENOUS | Status: DC | PRN
  Administered 2020-07-31: 40 mg via INTRAVENOUS
  Administered 2020-07-31: 30 mg via INTRAVENOUS
  Administered 2020-07-31 (×2): 20 mg via INTRAVENOUS
  Administered 2020-07-31: 60 mg via INTRAVENOUS
  Administered 2020-07-31: 40 mg via INTRAVENOUS

## 2020-07-31 MED ORDER — PHENYLEPHRINE HCL (PRESSORS) 10 MG/ML IV SOLN
INTRAVENOUS | Status: DC | PRN
  Administered 2020-07-31: 100 ug via INTRAVENOUS

## 2020-07-31 MED ORDER — LIDOCAINE HCL (CARDIAC) PF 100 MG/5ML IV SOSY
PREFILLED_SYRINGE | INTRAVENOUS | Status: DC | PRN
  Administered 2020-07-31: 50 mg via INTRAVENOUS

## 2020-07-31 NOTE — Transfer of Care (Signed)
Immediate Anesthesia Transfer of Care Note  Patient: Tracie Reed  Procedure(s) Performed: COLONOSCOPY WITH PROPOFOL  Patient Location: PACU  Anesthesia Type:General  Level of Consciousness: sedated  Airway & Oxygen Therapy: Patient Spontanous Breathing and Patient connected to nasal cannula oxygen  Post-op Assessment: Report given to RN and Post -op Vital signs reviewed and stable  Post vital signs: Reviewed and stable  Last Vitals:  Vitals Value Taken Time  BP 89/61 07/31/20 0908  Temp 36.4 C 07/31/20 0907  Pulse 71 07/31/20 0908  Resp 16 07/31/20 0908  SpO2 96 % 07/31/20 0908  Vitals shown include unvalidated device data.  Last Pain:  Vitals:   07/31/20 0907  TempSrc: Temporal  PainSc:          Complications: No notable events documented.

## 2020-07-31 NOTE — Op Note (Signed)
[facility]astro[facility] Patient Name: Tracie Reed Procedure Date: 07/31/2020 8:44 AM MRN: [id number] Account #: [id number] Date of Birth: 05-21-1945 Admit Type: Outpatient Age: 75 Room: [facility] ENDO ROOM 4 Gender: Female Note Status: Finalized Procedure:             Colonoscopy Indications:           Screening for colorectal malignant neoplasm Providers:             Lucilla Lame MD, MD Referring MD:          Jearld Fenton (Referring MD) Medicines:             Propofol per Anesthesia Complications:         No immediate complications. Procedure:             Pre-Anesthesia Assessment:                        - Prior to the procedure, a History and Physical was                         performed, and patient medications and allergies were                         reviewed. The patient's tolerance of previous                         anesthesia was also reviewed. The risks and benefits                         of the procedure and the sedation options and risks                         were discussed with the patient. All questions were                         answered, and informed consent was obtained. Prior                         Anticoagulants: The patient has taken no previous                         anticoagulant or antiplatelet agents. ASA Grade                         Assessment: II - A patient with mild systemic disease.                         After reviewing the risks and benefits, the patient                         was deemed in satisfactory condition to undergo the                         procedure.                        After obtaining informed consent, the colonoscope was  passed under direct vision. Throughout the procedure,                         the patient's blood pressure, pulse, and oxygen                         saturations were monitored continuously. The                         Colonoscope was introduced through the  anus and                         advanced to the the cecum, identified by appendiceal                         orifice and ileocecal valve. The colonoscopy was                         performed without difficulty. The patient tolerated                         the procedure well. The quality of the bowel                         preparation was good. Findings:      The perianal and digital rectal examinations were normal.      Multiple small-mouthed diverticula were found in the sigmoid colon.      Non-bleeding internal hemorrhoids were found during retroflexion. The       hemorrhoids were Grade I (internal hemorrhoids that do not prolapse). Impression:            - Diverticulosis in the sigmoid colon.                        - Non-bleeding internal hemorrhoids.                        - No specimens collected. Recommendation:        - Discharge patient to home.                        - Resume previous diet.                        - Continue present medications.                        - Repeat colonoscopy is not recommended for screening                         purposes. Procedure Code(s):     --- Professional ---                        [phone], Colonoscopy, flexible; diagnostic, including                         collection of specimen(s) by brushing or washing, when                         performed (separate procedure) Diagnosis  Code(s):     --- Professional ---                        Z12.11, Encounter for screening for malignant neoplasm                         of colon CPT copyright 2019 [employer]. All rights reserved. The codes documented in this report are preliminary and upon coder review may  be revised to meet current compliance requirements. Lucilla Lame MD, MD 07/31/2020 9:05:52 AM This report has been signed electronically. Number of Addenda: 0 Note Initiated On: 07/31/2020 8:44 AM Scope Withdrawal Time: 0 hours 7 minutes 45 seconds  Total Procedure Duration: 0  hours 14 minutes 7 seconds  Estimated Blood Loss:  Estimated blood loss: none.      [facility]

## 2020-07-31 NOTE — Anesthesia Postprocedure Evaluation (Signed)
Anesthesia Post Note  Patient: Tracie Reed  Procedure(s) Performed: COLONOSCOPY WITH PROPOFOL  Patient location during evaluation: PACU Anesthesia Type: General Level of consciousness: awake and alert Pain management: pain level controlled Vital Signs Assessment: post-procedure vital signs reviewed and stable Respiratory status: spontaneous breathing, nonlabored ventilation and respiratory function stable Cardiovascular status: blood pressure returned to baseline and stable Postop Assessment: no apparent nausea or vomiting Anesthetic complications: no   No notable events documented.   Last Vitals:  Vitals:   07/31/20 0747 07/31/20 0907  BP: 123/86 (!) 89/61  Pulse: 90   Resp: 18   Temp: (!) 36.2 C 36.4 C  SpO2: 99%     Last Pain:  Vitals:   07/31/20 0917  TempSrc:   PainSc: 0-No pain                 Tera Mater

## 2020-07-31 NOTE — Telephone Encounter (Signed)
Pt called stating that she was supposed to have a cholesterol medication sent to pharmacy. Pts medication list shows simvastatin as a print copy, but not a fax to the pharmacy. Please advise .       [facility] [place], [address] ([address]  [place] [address]  Phone: [phone] Fax: [phone]  Hours: Not open 24 hours

## 2020-07-31 NOTE — Anesthesia Preprocedure Evaluation (Signed)
Anesthesia Evaluation  Patient identified by MRN, date of birth, ID band Patient awake    Reviewed: Allergy & Precautions, NPO status , Patient's Chart, lab work & pertinent test results  History of Anesthesia Complications Negative for: history of anesthetic complications  Airway Mallampati: II  TM Distance: >3 FB Neck ROM: Full    Dental no notable dental hx.    Pulmonary neg sleep apnea, neg COPD, former smoker,    breath sounds clear to auscultation- rhonchi (-) wheezing      Cardiovascular Exercise Tolerance: Good (-) hypertension(-) CAD, (-) Past MI, (-) Cardiac Stents and (-) CABG  Rhythm:Regular Rate:Normal - Systolic murmurs and - Diastolic murmurs    Neuro/Psych neg Seizures PSYCHIATRIC DISORDERS Anxiety negative neurological ROS     GI/Hepatic negative GI ROS, Neg liver ROS,   Endo/Other  negative endocrine ROSneg diabetes  Renal/GU negative Renal ROS     Musculoskeletal negative musculoskeletal ROS (+)   Abdominal (+) - obese,   Peds  Hematology  (+) anemia ,   Anesthesia Other Findings Past Medical History: No date: Anemia No date: Anxiety No date: Pre-diabetes   Reproductive/Obstetrics                             Anesthesia Physical Anesthesia Plan  ASA: 2  Anesthesia Plan: General   Post-op Pain Management:    Induction: Intravenous  PONV Risk Score and Plan: 2 and Propofol infusion  Airway Management Planned: Natural Airway  Additional Equipment:   Intra-op Plan:   Post-operative Plan:   Informed Consent: I have reviewed the patients History and Physical, chart, labs and discussed the procedure including the risks, benefits and alternatives for the proposed anesthesia with the patient or authorized representative who has indicated his/her understanding and acceptance.     Dental advisory given  Plan Discussed with: CRNA and  Anesthesiologist  Anesthesia Plan Comments:         Anesthesia Quick Evaluation

## 2020-07-31 NOTE — H&P (Signed)
Tracie Lame, MD [address] [place], [place] [address] Phone: [phone] Fax : [phone]  Primary Care Physician:  Jearld Fenton, NP Primary Gastroenterologist:  Dr. Allen Norris  Pre-Procedure History & Physical: HPI:  Tracie Reed is a 75 y.o. female is here for a screening colonoscopy.   Past Medical History:  Diagnosis Date   Anemia    Anxiety    Pre-diabetes     Past Surgical History:  Procedure Laterality Date   BREAST EXCISIONAL BIOPSY Right 1993   neg   BUNIONECTOMY Bilateral    IRIDOTOMY / IRIDECTOMY Right     Prior to Admission medications   Medication Sig Start Date End Date Taking? Authorizing Provider  calcium carbonate (OS-CAL) 1250 (500 Ca) MG chewable tablet Chew 1 tablet by mouth daily.   Yes [provider]  cetirizine (ZYRTEC) 10 MG tablet Take 1 tablet (10 mg total) by mouth daily. 03/12/20  Yes Karamalegos, Devonne Doughty, DO  FLUoxetine (PROZAC) 10 MG tablet Take 1 tablet (10 mg total) by mouth daily. 06/13/20  Yes Jearld Fenton, NP  traZODone (DESYREL) 50 MG tablet Take 1-2 tablets (50-100 mg total) by mouth at bedtime as needed for sleep. 06/13/20  Yes Baity, Coralie Keens, NP  acyclovir ointment (ZOVIRAX) 5 % Apply 1 application topically every 3 (three) hours as needed. 12/15/17   Mikey College, NP  Diclofenac Sodium 1 % CREA Apply 1 application topically 4 (four) times daily as needed (right arm pain). 12/27/19   Malfi, Lupita Raider, FNP  latanoprost (XALATAN) 0.005 % ophthalmic solution Administer 1 drop to both eyes nightly. 08/23/19   [provider]  simvastatin (ZOCOR) 10 MG tablet Take 1 tablet (10 mg total) by mouth at bedtime. 07/23/20   Jearld Fenton, NP  valACYclovir (VALTREX) 1000 MG tablet Take 2 tablets (2,000 mg) at the start of your cold sore.  Take 1 tablet (1,000 mg) daily until sores have resolved. 07/19/19   Malfi, Lupita Raider, FNP    Allergies as of 07/23/2020   (No Known Allergies)    Family  History  Problem Relation Age of Onset   Depression Mother    Healthy Brother    Breast cancer Neg Hx     Social History   Socioeconomic History   Marital status: Significant Other    Spouse name: Not on file   Number of children: 2   Years of education: Not on file   Highest education level: Bachelor's degree (e.g., BA, AB, BS)  Occupational History   Occupation: self employed    Comment: Holiday representative  Tobacco Use   Smoking status: Former    Pack years: 0.00    Types: Cigarettes    Quit date: 11/12/1987    Years since quitting: 32.7   Smokeless tobacco: Never  Vaping Use   Vaping Use: Never used  Substance and Sexual Activity   Alcohol use: Not Currently   Drug use: Not Currently    Comment: past marijuana - very remote use   Sexual activity: Yes  Other Topics Concern   Not on file  Social History Narrative   Not on file   Social Determinants of Health   Financial Resource Strain: Low Risk    Difficulty of Paying Living Expenses: Not hard at all  Food Insecurity: No Food Insecurity   Worried About Charity fundraiser in the Last Year: Never true   [place] in the Last  Year: Never true  Transportation Needs: No Transportation Needs   Lack of Transportation (Medical): No   Lack of Transportation (Non-Medical): No  Physical Activity: Sufficiently Active   Days of Exercise per Week: 7 days   Minutes of Exercise per Session: 30 min  Stress: No Stress Concern Present   Feeling of Stress : Not at all  Social Connections: Not on file  Intimate Partner Violence: Not on file    Review of Systems: See HPI, otherwise negative ROS  Physical Exam: BP 123/86   Pulse 90   Temp (!) 97.2 F (36.2 C) (Temporal)   Resp 18   Ht 5\' 7"  (1.702 m)   Wt 54.4 kg   SpO2 99%   BMI 18.79 kg/m  General:   Alert,  pleasant and cooperative in NAD Head:  Normocephalic and atraumatic. Neck:  Supple; no masses or thyromegaly. Lungs:  Clear  throughout to auscultation.    Heart:  Regular rate and rhythm. Abdomen:  Soft, nontender and nondistended. Normal bowel sounds, without guarding, and without rebound.   Neurologic:  Alert and  oriented x4;  grossly normal neurologically.  Impression/Plan: Tracie Reed is now here to undergo a screening colonoscopy.  Risks, benefits, and alternatives regarding colonoscopy have been reviewed with the patient.  Questions have been answered.  All parties agreeable.

## 2020-08-01 ENCOUNTER — Encounter: Payer: Self-pay | Admitting: Gastroenterology

## 2020-08-02 ENCOUNTER — Other Ambulatory Visit: Payer: Self-pay

## 2020-08-02 MED ORDER — SIMVASTATIN 10 MG PO TABS: 10.0000 mg | ORAL_TABLET | Freq: Every day | ORAL | 2 refills | Status: DC

## 2020-08-02 MED ORDER — SIMVASTATIN 10 MG PO TABS
10.0000 mg | ORAL_TABLET | Freq: Every day | ORAL | 2 refills | Status: DC
Start: 2020-08-02 — End: 2020-08-02

## 2020-08-09 ENCOUNTER — Ambulatory Visit (INDEPENDENT_AMBULATORY_CARE_PROVIDER_SITE_OTHER): Payer: Medicare Other | Admitting: Internal Medicine

## 2020-08-09 ENCOUNTER — Encounter: Payer: Self-pay | Admitting: Internal Medicine

## 2020-08-09 ENCOUNTER — Other Ambulatory Visit: Payer: Self-pay

## 2020-08-09 VITALS — BP 111/69 | HR 62 | Temp 98.4°F | Resp 17 | Ht 67.0 in | Wt 128.2 lb

## 2020-08-09 DIAGNOSIS — H259 Unspecified age-related cataract: Secondary | ICD-10-CM

## 2020-08-09 DIAGNOSIS — Z0289 Encounter for other administrative examinations: Secondary | ICD-10-CM | POA: Diagnosis not present

## 2020-08-09 DIAGNOSIS — Z01818 Encounter for other preprocedural examination: Secondary | ICD-10-CM | POA: Diagnosis not present

## 2020-08-09 NOTE — Patient Instructions (Signed)
Cataract  A cataract is a buildup of protein that causes the lens of your eye to become cloudy. The lens is normally clear. It is the part of the eye that is behind your iris and pupil. The lens focuses light on the retina, which lets you see clearly. When a lens becomes cloudy, your vision may become blurry. Theclouding can range from a tiny dot to complete cloudiness. As some cataracts develop, they can make it harder for you to see things that are far away. (You become more nearsighted.) Other cataracts increase glare. Cataracts can worsen over time, and sometimes the pupil can look white. As cataracts get worse, they cloud more of the lens,making it difficult to see. Cataracts can affect one eye or both eyes. What are the causes? This condition may be caused by age-related eye changes. The lens of the eye is mostly made up of water and protein. Normally, this protein is arranged in a way that keeps the lens clear. Cataracts develop when protein begins to clump together over time. This buildup of protein clouds the lens and lets less lightpass through to the retina, which causes blurry vision. What increases the risk? You are more likely to develop this condition if you: Are 43 years of age or older. Have diabetes. Have high blood pressure. Take certain medicines, such as steroids or hormone replacement therapy. Have had an eye injury. Have or have had eye inflammation. Have a family history of cataracts. Smoke. Drink alcohol heavily. Are frequently exposed to sun or very strong light without eye protection. Are obese. Have been exposed to large amounts of radiation, lead, or other toxic substances. Have had eye surgery. What are the signs or symptoms? The main symptom of a cataract is blurry vision. Your vision may change or get worse over time. Other symptoms include: Increased glare. Seeing a bright ring or halo around light. Poor night vision. Double or "shadow" vision in one eye or  both eyes. Having trouble seeing, even while wearing contact lenses or glasses. Seeing colors that appear faded. Having trouble telling the difference between blue and purple. Needing frequent changes to your prescription glasses or contacts. How is this diagnosed? This condition is diagnosed with a medical history and eye exam. You should see an eye specialist (optometrist or ophthalmologist). Your health care provider may enlarge (dilate) your pupils with eye drops to see the back of your eye more clearly and look for signs of cataracts or other eye damage. You may also have tests, including: A visual acuity test. This uses a chart to determine the smallest letters that you can see from a specific distance. A slit-lamp exam. This uses a microscope to examine small sections of your eye for abnormalities. Tonometry. This test measures the pressure of the fluid inside your eye. Glare testing. This test shines a light in your eye while you view letters to see whether the bright light affects your vision. How is this treated? Treatment depends on the stage of your cataract. You may: Wear eyeglasses or use stronger light. This is for an early-stage cataract. Have surgery if the condition is severely affecting your vision. This is needed for late-stage cataract. Stop or change certain medicines. This is recommended if your health care provider thinks your cataract may be linked to your medicines. Follow these instructions at home: Lifestyle Use stronger or brighter lighting. Consider using a magnifying glass for reading or other activities. Become familiar with your surroundings. Having poor vision can put you  at greater risk for tripping, falling, or bumping into things. Wear sunglasses and a hat if you are sensitive to bright light or are having problems with glare. Do not use any products that contain nicotine or tobacco, such as cigarettes, e-cigarettes, and chewing tobacco. If you need help  quitting, ask your health care provider. General instructions If you are prescribed new eyeglasses, wear them as told by your health care provider. Take over-the-counter and prescription medicines only as told by your health care provider. Do not change your medicines unless told by your health care provider. Do not drive or use heavy machinery if your vision is blurry, particularly at night. Keep your blood sugar under control if you have diabetes. Keep all follow-up visits as told by your health care provider. This is important. Contact a health care provider if: Your symptoms get worse. Your vision affects your ability to perform daily activities. You have new symptoms. You have a fever. Get help right away if: You have sudden vision loss. You have redness, swelling, or increasing pain in your eye. You develop a headache and sensitivity to light. Summary A cataract is a buildup of protein that causes the lens of your eye to become cloudy. Cataracts are very common, especially as people age. Mild cataracts cause mild visual symptoms, while more severe cataracts can cause a significant decrease in quality of life. Mild cataracts can often be treated with a prescription for new glasses or contact lenses, while surgery is often recommended for more severe cataracts. Contact a health care provider if your symptoms get worse, your vision affects your ability to do daily activities, or you have a fever. Get help right away if you have sudden vision loss, redness, swelling, or increasing pain in the eye, or you develop a headache or sensitivity to light. This information is not intended to replace advice given to you by your health care provider. Make sure you discuss any questions you have with your healthcare provider. Document Revised: 07/13/2017 Document Reviewed: 07/13/2017 Elsevier Patient Education  [employer].

## 2020-08-09 NOTE — Progress Notes (Signed)
Subjective:    Patient ID: Tracie Reed, female    DOB: 1945/02/07, 75 y.o.   MRN: [id number]  HPI  Patient presents the clinic today for surgical clearance prior to cataract surgery on her left eye scheduled for 7/25 by Dr. Leanora Ivanoff.  Review of Systems  Past Medical History:  Diagnosis Date   Anemia    Anxiety    Pre-diabetes     Current Outpatient Medications  Medication Sig Dispense Refill   acyclovir ointment (ZOVIRAX) 5 % Apply 1 application topically every 3 (three) hours as needed. 15 g 1   calcium carbonate (OS-CAL) 1250 (500 Ca) MG chewable tablet Chew 1 tablet by mouth daily.     cetirizine (ZYRTEC) 10 MG tablet Take 1 tablet (10 mg total) by mouth daily. 90 tablet 3   Diclofenac Sodium 1 % CREA Apply 1 application topically 4 (four) times daily as needed (right arm pain). 120 g 1   FLUoxetine (PROZAC) 10 MG tablet Take 1 tablet (10 mg total) by mouth daily. 90 tablet 0   latanoprost (XALATAN) 0.005 % ophthalmic solution Administer 1 drop to both eyes nightly.     simvastatin (ZOCOR) 10 MG tablet Take 1 tablet (10 mg total) by mouth at bedtime. 30 tablet 2   traZODone (DESYREL) 50 MG tablet Take 1-2 tablets (50-100 mg total) by mouth at bedtime as needed for sleep. 180 tablet 0   valACYclovir (VALTREX) 1000 MG tablet Take 2 tablets (2,000 mg) at the start of your cold sore.  Take 1 tablet (1,000 mg) daily until sores have resolved. 15 tablet 1   No current facility-administered medications for this visit.    No Known Allergies  Family History  Problem Relation Age of Onset   Depression Mother    Healthy Brother    Breast cancer Neg Hx     Social History   Socioeconomic History   Marital status: Significant Other    Spouse name: Not on file   Number of children: 2   Years of education: Not on file   Highest education level: Bachelor's degree (e.g., BA, AB, BS)  Occupational History   Occupation: self employed    Comment: Engineer, mining  Tobacco Use   Smoking status: Former    Types: Cigarettes    Quit date: 11/12/1987    Years since quitting: 32.7   Smokeless tobacco: Never  Vaping Use   Vaping Use: Never used  Substance and Sexual Activity   Alcohol use: Not Currently   Drug use: Not Currently    Comment: past marijuana - very remote use   Sexual activity: Yes  Other Topics Concern   Not on file  Social History Narrative   Not on file   Social Determinants of Health   Financial Resource Strain: Low Risk    Difficulty of Paying Living Expenses: Not hard at all  Food Insecurity: No Food Insecurity   Worried About Charity fundraiser in the Last Year: Never true   [place] in the Last Year: Never true  Transportation Needs: No Transportation Needs   Lack of Transportation (Medical): No   Lack of Transportation (Non-Medical): No  Physical Activity: Sufficiently Active   Days of Exercise per Week: 7 days   Minutes of Exercise per Session: 30 min  Stress: No Stress Concern Present   Feeling of Stress : Not at all  Social Connections: Not on file  Intimate Partner Violence: Not on file  Constitutional: Denies fever, malaise, fatigue, headache or abrupt weight changes.  HEENT: Denies eye pain, eye redness, ear pain, ringing in the ears, wax buildup, runny nose, nasal congestion, bloody nose, or sore throat. Respiratory: Denies difficulty breathing, shortness of breath, cough or sputum production.   Cardiovascular: Denies chest pain, chest tightness, palpitations or swelling in the hands or feet.  Gastrointestinal: Denies abdominal pain, bloating, constipation, diarrhea or blood in the stool.  GU: Denies urgency, frequency, pain with urination, burning sensation, blood in urine, odor or discharge. Musculoskeletal: Denies decrease in range of motion, difficulty with gait, muscle pain or joint pain and swelling.  Skin: Denies redness, rashes, lesions or ulcercations.  Neurological: Pt  reports insomnia. Denies dizziness, difficulty with memory, difficulty with speech or problems with balance and coordination.  Psych: Pt has a history of anxiety. Denies depression, SI/HI.  No other specific complaints in a complete review of systems (except as listed in HPI above).     Objective:   Physical Exam BP 111/69 (BP Location: Right Arm, Patient Position: Sitting, Cuff Size: Small)   Pulse 62   Temp 98.4 F (36.9 C) (Temporal)   Resp 17   Ht 5\' 7"  (1.702 m)   Wt 128 lb 3.2 oz (58.2 kg)   SpO2 100%   BMI 20.08 kg/m   Wt Readings from Last 3 Encounters:  07/31/20 120 lb (54.4 kg)  07/19/20 127 lb 3.2 oz (57.7 kg)  02/07/20 115 lb (52.2 kg)    General: Appears her stated age, well developed, well nourished in NAD. HEENT: Head: normal shape and size; Eyes: sclera white, no icterus, conjunctiva pink, PERRLA and EOMs intact;  Cardiovascular: Normal rate and rhythm. S1,S2 noted.  No murmur, rubs or gallops noted.  Pulmonary/Chest: Normal effort and positive vesicular breath sounds. No respiratory distress. No wheezes, rales or ronchi noted.  Musculoskeletal: No difficulty with gait.  Neurological: Alert and oriented.  Psychiatric: Mood and affect normal. Behavior is normal. Judgment and thought content normal.    BMET    Component Value Date/Time   NA 139 07/19/2020 1026   K 4.5 07/19/2020 1026   CL 103 07/19/2020 1026   CO2 29 07/19/2020 1026   GLUCOSE 80 07/19/2020 1026   BUN 10 07/19/2020 1026   CREATININE 0.67 07/19/2020 1026   CALCIUM 9.5 07/19/2020 1026   GFRNONAA 86 07/19/2020 1026   GFRAA 100 07/19/2020 1026    Lipid Panel     Component Value Date/Time   CHOL 230 (H) 07/19/2020 1026   TRIG 91 07/19/2020 1026   HDL 59 07/19/2020 1026   CHOLHDL 3.9 07/19/2020 1026   LDLCALC 151 (H) 07/19/2020 1026    CBC    Component Value Date/Time   WBC 7.7 07/19/2020 1026   RBC 4.64 07/19/2020 1026   HGB 13.6 07/19/2020 1026   HCT 42.3 07/19/2020 1026    PLT 239 07/19/2020 1026   MCV 91.2 07/19/2020 1026   MCH 29.3 07/19/2020 1026   MCHC 32.2 07/19/2020 1026   RDW 13.0 07/19/2020 1026   LYMPHSABS 1,477 07/05/2019 1041   EOSABS 28 07/05/2019 1041   BASOSABS 63 07/05/2019 1041    Hgb A1C Lab Results  Component Value Date   HGBA1C 5.2 07/05/2019           Assessment & Plan:   Surgical Clearance for Cataracts:  Surgical clearance form filled out, will scan and fax No indication for blood work or ECG at this time  Return precautions discussed Webb Silversmith,  NP This visit occurred during the SARS-CoV-2 public health emergency.  Safety protocols were in place, including screening questions prior to the visit, additional usage of staff PPE, and extensive cleaning of exam room while observing appropriate contact time as indicated for disinfecting solutions.

## 2020-08-14 ENCOUNTER — Ambulatory Visit: Payer: Medicare Other | Admitting: Internal Medicine

## 2020-08-20 DIAGNOSIS — Z79899 Other long term (current) drug therapy: Secondary | ICD-10-CM | POA: Diagnosis not present

## 2020-08-20 DIAGNOSIS — H2512 Age-related nuclear cataract, left eye: Secondary | ICD-10-CM | POA: Diagnosis not present

## 2020-08-20 DIAGNOSIS — F419 Anxiety disorder, unspecified: Secondary | ICD-10-CM | POA: Diagnosis not present

## 2020-08-20 DIAGNOSIS — E785 Hyperlipidemia, unspecified: Secondary | ICD-10-CM | POA: Diagnosis not present

## 2020-08-20 DIAGNOSIS — Z87891 Personal history of nicotine dependence: Secondary | ICD-10-CM | POA: Diagnosis not present

## 2020-08-22 ENCOUNTER — Other Ambulatory Visit: Payer: Self-pay

## 2020-08-22 ENCOUNTER — Ambulatory Visit
Admission: RE | Admit: 2020-08-22 | Discharge: 2020-08-22 | Disposition: A | Discharge status: Home / Self Care | Payer: Medicare Other | Source: Ambulatory Visit | Attending: Internal Medicine | Admitting: Internal Medicine

## 2020-08-22 DIAGNOSIS — Z1231 Encounter for screening mammogram for malignant neoplasm of breast: Secondary | ICD-10-CM | POA: Diagnosis not present

## 2020-08-24 ENCOUNTER — Other Ambulatory Visit: Payer: Self-pay | Admitting: Internal Medicine

## 2020-08-24 DIAGNOSIS — R928 Other abnormal and inconclusive findings on diagnostic imaging of breast: Secondary | ICD-10-CM

## 2020-08-27 ENCOUNTER — Other Ambulatory Visit: Payer: Self-pay | Admitting: Internal Medicine

## 2020-08-27 DIAGNOSIS — R928 Other abnormal and inconclusive findings on diagnostic imaging of breast: Secondary | ICD-10-CM

## 2020-08-27 DIAGNOSIS — R921 Mammographic calcification found on diagnostic imaging of breast: Secondary | ICD-10-CM

## 2020-09-03 DIAGNOSIS — H269 Unspecified cataract: Secondary | ICD-10-CM | POA: Diagnosis not present

## 2020-09-03 DIAGNOSIS — H2511 Age-related nuclear cataract, right eye: Secondary | ICD-10-CM | POA: Diagnosis not present

## 2020-09-03 DIAGNOSIS — E785 Hyperlipidemia, unspecified: Secondary | ICD-10-CM | POA: Diagnosis not present

## 2020-09-03 DIAGNOSIS — Z79899 Other long term (current) drug therapy: Secondary | ICD-10-CM | POA: Diagnosis not present

## 2020-09-03 HISTORY — PX: CATARACT EXTRACTION: SUR2

## 2020-09-06 ENCOUNTER — Other Ambulatory Visit: Payer: Self-pay | Admitting: Internal Medicine

## 2020-09-06 ENCOUNTER — Other Ambulatory Visit: Payer: Self-pay

## 2020-09-06 ENCOUNTER — Ambulatory Visit
Admission: RE | Admit: 2020-09-06 | Discharge: 2020-09-06 | Disposition: A | Discharge status: Home / Self Care | Payer: Medicare Other | Source: Ambulatory Visit | Attending: Internal Medicine | Admitting: Internal Medicine

## 2020-09-06 DIAGNOSIS — R921 Mammographic calcification found on diagnostic imaging of breast: Secondary | ICD-10-CM | POA: Diagnosis not present

## 2020-09-06 DIAGNOSIS — R922 Inconclusive mammogram: Secondary | ICD-10-CM | POA: Diagnosis not present

## 2020-09-06 DIAGNOSIS — R928 Other abnormal and inconclusive findings on diagnostic imaging of breast: Secondary | ICD-10-CM

## 2020-09-07 ENCOUNTER — Telehealth: Payer: Self-pay | Admitting: Internal Medicine

## 2020-09-07 NOTE — Telephone Encounter (Signed)
Patient states that she had a mammogram and follow up recommendation was to have a biopsy. Patient needs Rollene Fare to out order in. Patient is aware that Webb Silversmith is out of office  this week.

## 2020-09-08 ENCOUNTER — Other Ambulatory Visit: Payer: Self-pay | Admitting: Internal Medicine

## 2020-09-08 DIAGNOSIS — F419 Anxiety disorder, unspecified: Secondary | ICD-10-CM

## 2020-09-08 NOTE — Telephone Encounter (Signed)
Requested Prescriptions  Pending Prescriptions Disp Refills  . FLUoxetine (PROZAC) 10 MG tablet [Pharmacy Med Name: FLUOXETINE '10MG'$  TABLETS] 90 tablet 0    Sig: TAKE 1 TABLET(10 MG) BY MOUTH DAILY     Psychiatry:  Antidepressants - SSRI Passed - 09/08/2020 10:43 AM      Passed - Valid encounter within last 6 months    Recent Outpatient Visits          1 month ago Encounter for completion of form with patient   [facility], Coralie Keens, NP   1 month ago Encounter for general adult medical examination with abnormal findings   [facility] [place], Coralie Keens, NP   5 months ago Acute viral conjunctivitis of right eye   [place], NP   8 months ago Right arm pain   [facility], Lupita Raider, FNP   8 months ago [facility] [place], Lupita Raider, FNP      Future Appointments            In 5 months [facility], [facility]

## 2020-09-09 NOTE — Telephone Encounter (Cosign Needed)
They will send me an electronic order to sign

## 2020-09-14 ENCOUNTER — Other Ambulatory Visit: Payer: Self-pay

## 2020-09-14 ENCOUNTER — Ambulatory Visit
Admission: RE | Admit: 2020-09-14 | Discharge: 2020-09-14 | Disposition: A | Discharge status: Home / Self Care | Payer: Medicare Other | Source: Ambulatory Visit | Attending: Internal Medicine | Admitting: Internal Medicine

## 2020-09-14 DIAGNOSIS — D0511 Intraductal carcinoma in situ of right breast: Secondary | ICD-10-CM | POA: Diagnosis not present

## 2020-09-14 DIAGNOSIS — R921 Mammographic calcification found on diagnostic imaging of breast: Secondary | ICD-10-CM

## 2020-09-14 DIAGNOSIS — R928 Other abnormal and inconclusive findings on diagnostic imaging of breast: Secondary | ICD-10-CM | POA: Diagnosis not present

## 2020-09-14 HISTORY — PX: BREAST BIOPSY: SHX20

## 2020-09-17 ENCOUNTER — Encounter: Payer: Self-pay | Admitting: *Deleted

## 2020-09-17 DIAGNOSIS — D0511 Intraductal carcinoma in situ of right breast: Secondary | ICD-10-CM

## 2020-09-17 NOTE — Progress Notes (Signed)
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  I called patient to establish navigation services.  She would like to stay with [facility] as long as we accept her insurance.  Patient states she has been reading and has read that "older women may not require radiation therapy.  Informed patient of our Comet Trial.  She is interested and would like to discuss with the research nurse.  Message sent to Jeral Fruit, RN.  Patient scheduled to see Dr. Christian Mate on 09/20/20 @ 3:00 and Dr. Janese Banks on 8:30 at 3:00. Dr. Forest Becker office to give patient her breast cancer educational literature, "My Breast Cancer Treatment Handbook" by Josephine Igo, RN.   She was encouraged to call with any questions or needs.

## 2020-09-18 ENCOUNTER — Telehealth: Payer: Self-pay

## 2020-09-18 NOTE — Telephone Encounter (Signed)
Research nurse contacted the patient this morning at the request of the oncology navigator, Sheena, RN as the patient expressed interest in the COMET clinical trial. The protocol study was reviewed briefly with explanations provided for objectives, randomization, randomization arms, study treatment and voluntary participation. The patients questions were answered to her voiced satisfaction. Research department is mailing the patient a copy of the ICF and COMET approved clinical trial information from the study to review prior to her appointment with Dr. Janese Banks on September 25, 2020 at 1045 am. Explained to the patient that Dr. Janese Banks will explain the clinical trial to her as well and discuss her options at this visit. If the patient is interested, the research nurse will meet with her afterwards to review the consent in detail. The patient was encouraged to write down any questions she has when reviewing the information provided so that she won't forget them during her consult. The patient was informed that her participation in clinical trials is voluntary and if she decides not to be in the clinical trial, this would in no way affect her care here at the cancer center. The patient verbalized understanding of all above information and was encouraged to call the research department with any questions or concerns she may have prior to her appointment with Dr. Janese Banks next week. Business card for the research nurse is provided in the envelope mailed to her today.  Jeral Fruit, RN 09/18/20 10:27 AM

## 2020-09-20 ENCOUNTER — Ambulatory Visit: Payer: Self-pay | Admitting: Surgery

## 2020-09-20 ENCOUNTER — Other Ambulatory Visit: Payer: Self-pay | Admitting: Surgery

## 2020-09-20 ENCOUNTER — Ambulatory Visit: Payer: Medicare Other | Admitting: Surgery

## 2020-09-20 ENCOUNTER — Other Ambulatory Visit: Payer: Self-pay

## 2020-09-20 ENCOUNTER — Encounter: Payer: Self-pay | Admitting: Surgery

## 2020-09-20 VITALS — BP 118/78 | HR 78 | Temp 98.5°F | Ht 67.0 in | Wt 130.6 lb

## 2020-09-20 DIAGNOSIS — D0511 Intraductal carcinoma in situ of right breast: Secondary | ICD-10-CM

## 2020-09-20 DIAGNOSIS — Z853 Personal history of malignant neoplasm of breast: Secondary | ICD-10-CM | POA: Insufficient documentation

## 2020-09-20 NOTE — Patient Instructions (Signed)
Our surgery scheduler will call you within 24-48 hours to schedule your surgery. Please have the Blue surgery sheet available when speaking with her.   Lumpectomy A lumpectomy, sometimes called a partial mastectomy, is surgery to remove a cancerous tumor or mass (the lump) from a breast. It is a form of breast-conserving or breast-preservation surgery. This means that the cancerous tissue is removed but the breast remains intact. During a lumpectomy, the portion of the breast that contains the tumor is removed. Some normal tissue around the lump may be taken out to make sure that all of the tumor has been removed. Lymph nodes under your arm may also be removed and tested to find out if the cancer has spread. Lymph nodes are part of the body's disease-fighting system (immune system) and are usually the first place where breast cancer spreads. Tell a health care provider about: Any allergies you have. All medicines you are taking, including vitamins, herbs, eye drops, creams, and over-the-counter medicines. Any problems you or family members have had with anesthetic medicines. Any blood disorders you have. Any surgeries you have had. Any medical conditions you have. Whether you are pregnant or may be pregnant. What are the risks? Generally, this is a safe procedure. However, problems may occur, including: Bleeding. Infection. Allergic reaction to medicines. Pain, swelling, weakness, or numbness in the arm on the side of your surgery. Temporary swelling. Change in the shape of the breast, particularly if a large portion is removed. Scar tissue that forms at the surgical site and feels hard to the touch. Blood clots. What happens before the procedure? Staying hydrated Follow instructions from your health care provider about hydration, which may include: Up to 2 hours before the procedure - you may continue to drink clear liquids, such as water, clear fruit juice, black coffee, and plain  tea.  Eating and drinking restrictions Follow instructions from your health care provider about eating and drinking, which may include: 8 hours before the procedure - stop eating heavy meals or foods, such as meat, fried foods, or fatty foods. 6 hours before the procedure - stop eating light meals or foods, such as toast or cereal. 6 hours before the procedure - stop drinking milk or drinks that contain milk. 2 hours before the procedure - stop drinking clear liquids. Medicines Ask your health care provider about: Changing or stopping your regular medicines. This is especially important if you are taking diabetes medicines or blood thinners. Taking medicines such as aspirin and ibuprofen. These medicines can thin your blood. Do not take these medicines unless your health care provider tells you to take them. Taking over-the-counter medicines, vitamins, herbs, and supplements. General instructions Prior to surgery, your health care provider may do a procedure to locate and mark the tumor area in your breast (localization). This will help guide your surgeon to where the incision will be made. This may be done with: Imaging, such as a mammogram, ultrasound, or MRI. Insertion of a small wire, clip, or seed, or an implant that will reflect a radar signal. You may have screening tests or exams to get baseline measurements of your arm. These can be compared to measurements done after surgery to monitor for swelling (lymphedema) that can develop after having lymph nodes removed. Ask your health care provider: How your surgery site will be marked. What steps will be taken to help prevent infection. These may include: Washing skin with a germ-killing soap. Taking antibiotic medicine. Plan to have someone take you home   from the hospital or clinic. Plan to have a responsible adult care for you for at least 24 hours after you leave the hospital or clinic. This is important. What happens during the  procedure?  An IV will be inserted into one of your veins. You will be given one or more of the following: A medicine to help you relax (sedative). A medicine to numb the area (local anesthetic). A medicine to make you fall asleep (general anesthetic). Your health care provider will use a kind of electric scalpel that uses heat to reduce bleeding (electrocautery knife). A curved incision that follows the natural curve of your breast will be made. This type of incision will allow for minimal scarring and better healing. The tumor will be removed along with some of the tissue around it. This will be sent to the lab for testing. Your health care provider may also remove lymph nodes at this time if needed. If the tumor is close to the muscles over your chest, some muscle tissue may also be removed. A small drain tube may be inserted into your breast area or armpit to collect fluid that may build up after surgery. This tube will be connected to a suction bulb on the outside of your body to remove the fluid. The incision will be closed with stitches (sutures). A bandage (dressing) may be placed over the incision. The procedure may vary among health care providers and hospitals. What happens after the procedure? Your blood pressure, heart rate, breathing rate, and blood oxygen level will be monitored until you leave the hospital or clinic. You will be given medicine for pain as needed. Your IV will be removed when you are able to eat and drink by mouth. You will be encouraged to get up and walk as soon as you can. This is important to improve blood flow and breathing. Ask for help if you feel weak or unsteady. You may have: A drain tube in place for 2-3 days to prevent a collection of blood (hematoma) from developing in the breast. You will be given instructions about caring for the drain before you go home. A pressure bandage applied for 1-2 days to prevent bleeding or swelling. Your pressure bandage  may look like a thick piece of fabric or an elastic wrap. Ask your health care provider how to care for your bandage at home. You may be given a tight sleeve to wear over your arm on the side of your surgery. You should wear this sleeve as told by your health care provider. Do not drive for 24 hours if you were given a sedative during your procedure. Summary A lumpectomy, sometimes called a partial mastectomy, is surgery to remove a cancerous tumor or mass (the lump) from a breast. During a lumpectomy, the portion of the breast that contains the tumor is removed. Lymph nodes under your arm may also be removed and tested to find out if the cancer has spread. Plan to have someone take you home from the hospital or clinic. You may have a drain tube in place for 2-3 days to prevent a collection of blood (hematoma) from developing in the breast. You will be given instructions about caring for the drain before you go home. This information is not intended to replace advice given to you by your health care provider. Make sure you discuss any questions you have with your health care provider. Document Revised: 07/19/2018 Document Reviewed: 07/19/2018 Elsevier Patient Education  2022 [employer]  

## 2020-09-20 NOTE — H&P (View-Only) (Signed)
Patient ID: Tracie Reed, female   DOB: 04/24/1945, 75 y.o.   MRN: [id number]  Chief Complaint: DCIS right breast  History of Present Illness Tracie Reed is a 75 y.o. 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  Having no breast complaints or recent issues, this was achieved through screening mammography, subsequent diagnostic mammography.  She had a previous breast biopsy/lumpectomy of some calcifications nearly 3 decades ago for benign disease.  She has had no history of hormone replacement therapy.  She underwent menopause at the age of 26 having begun menstruating at the age of 16.  She has had 2 pregnancies her first delivered at the age of 62.  She has had no changes in her breast no lump, no nipple discharge no skin changes no breast pain she did not even do monthly breast exams.  She has an appointment to see oncology next week. Her family history consists of her mother who had a mastectomy in her 36s.  Past Medical History Past Medical History:  Diagnosis Date   Anemia    Anxiety    Pre-diabetes       Past Surgical History:  Procedure Laterality Date   BREAST BIOPSY Right 09/14/2020   Affirm bx-calcs, "Ribbon" marker-path pending   BREAST EXCISIONAL BIOPSY Right 1993   neg   BUNIONECTOMY Bilateral    COLONOSCOPY WITH PROPOFOL N/A 07/31/2020   Procedure: COLONOSCOPY WITH PROPOFOL;  Surgeon: Lucilla Lame, MD;  Location: [facility] ENDOSCOPY;  Service: Endoscopy;  Laterality: N/A;   IRIDOTOMY / IRIDECTOMY Right     No Known Allergies  Current Outpatient Medications  Medication Sig Dispense Refill   acyclovir ointment (ZOVIRAX) 5 % Apply 1 application topically every 3 (three) hours as needed. 15 g 1   calcium carbonate (OS-CAL) 1250 (500 Ca) MG chewable tablet Chew 1 tablet by mouth daily.     cetirizine (ZYRTEC) 10 MG tablet Take 1 tablet (10 mg total) by mouth daily. 90 tablet 3   Diclofenac Sodium 1 % CREA  Apply 1 application topically 4 (four) times daily as needed (right arm pain). 120 g 1   FLUoxetine (PROZAC) 10 MG tablet TAKE 1 TABLET(10 MG) BY MOUTH DAILY 90 tablet 0   latanoprost (XALATAN) 0.005 % ophthalmic solution Administer 1 drop to both eyes nightly.     simvastatin (ZOCOR) 10 MG tablet Take 1 tablet (10 mg total) by mouth at bedtime. 30 tablet 2   traZODone (DESYREL) 50 MG tablet Take 1-2 tablets (50-100 mg total) by mouth at bedtime as needed for sleep. 180 tablet 0   valACYclovir (VALTREX) 1000 MG tablet Take 2 tablets (2,000 mg) at the start of your cold sore.  Take 1 tablet (1,000 mg) daily until sores have resolved. 15 tablet 1   No current facility-administered medications for this visit.    Family History Family History  Problem Relation Age of Onset   Depression Mother    Healthy Brother    Breast cancer Neg Hx       Social History Social History   Tobacco Use   Smoking status: Former    Types: Cigarettes    Quit date: 11/12/1987    Years since quitting: 32.8   Smokeless tobacco: Never  Vaping Use   Vaping Use: Never used  Substance Use Topics   Alcohol use: Not Currently   Drug use: Not Currently    Comment: past marijuana - very remote use  Review of Systems  Constitutional: Negative.   HENT:  Positive for hearing loss.   Eyes: Negative.   Respiratory: Negative.    Cardiovascular: Negative.   Gastrointestinal: Negative.   Genitourinary: Negative.   Skin: Negative.   Neurological: Negative.   Psychiatric/Behavioral: Negative.       Physical Exam Blood pressure 118/78, pulse 78, temperature 98.5 F (36.9 C), temperature source Oral, height '5\' 7"'$  (1.702 m), weight 130 lb 9.6 oz (59.2 kg), SpO2 96 %. Last Weight  Most recent update: 09/20/2020  2:59 PM    Weight  59.2 kg (130 lb 9.6 oz)             CONSTITUTIONAL: Well developed, and nourished, appropriately responsive and aware without distress.   EYES: Sclera non-icteric.    EARS, NOSE, MOUTH AND THROAT: Mask worn.      Hearing is intact to voice.  NECK: Trachea is midline, and there is no jugular venous distension.  LYMPH NODES:  Lymph nodes in the neck are not enlarged. RESPIRATORY:  Lungs are clear, and breath sounds are equal bilaterally. Normal respiratory effort without pathologic use of accessory muscles. CARDIOVASCULAR: Heart is regular in rate and rhythm. GI: The abdomen is soft, nontender, and nondistended.  MUSCULOSKELETAL:  Symmetrical muscle tone appreciated in all four extremities.    SKIN: Skin turgor is normal. No pathologic skin lesions appreciated.  NEUROLOGIC:  Motor and sensation appear grossly normal.  Cranial nerves are grossly without defect. PSYCH:  Alert and oriented to person, place and time. Affect is appropriate for situation.  Data Reviewed I have personally reviewed what is currently available of the patient's imaging, recent labs and medical records.   Labs:  CBC Latest Ref Rng & Units 07/19/2020 07/05/2019 12/15/2017  WBC 3.8 - 10.8 Thousand/uL 7.7 7.0 7.8  Hemoglobin 11.7 - 15.5 g/dL 13.6 15.1 14.4  Hematocrit 35.0 - 45.0 % 42.3 44.5 41.1  Platelets 140 - 400 Thousand/uL 239 252 238   CMP Latest Ref Rng & Units 07/19/2020 07/05/2019 12/15/2017  Glucose 65 - 99 mg/dL 80 122(H) 74  BUN 7 - 25 mg/dL '10 8 9  '$ Creatinine 0.60 - 0.93 mg/dL 0.67 0.67 0.78  Sodium 135 - 146 mmol/L 139 140 140  Potassium 3.5 - 5.3 mmol/L 4.5 4.3 3.9  Chloride 98 - 110 mmol/L 103 103 99  CO2 20 - 32 mmol/L '29 27 23  '$ Calcium 8.6 - 10.4 mg/dL 9.5 9.9 9.6  Total Protein 6.1 - 8.1 g/dL 6.6 6.5 6.8  Total Bilirubin 0.2 - 1.2 mg/dL 0.4 0.5 0.6  AST 10 - 35 U/L 15 18 44(H)  ALT 6 - 29 U/L '13 15 29    '$ SURGICAL PATHOLOGY  CASE: [phone]  PATIENT: Tracie Reed  Surgical Pathology Report   Specimen Submitted:  A. Breast, right upper outer   Clinical History: New grouped calcifications, rule out DCIS.  Ribbon-shaped clip placed following  stereotactic biopsy of RIGHT breast,  upper outer quadrant.   DIAGNOSIS:  A. BREAST, RIGHT UPPER OUTER QUADRANT; STEREOTACTIC CORE NEEDLE BIOPSY:  - DUCTAL CARCINOMA IN SITU, LOW-GRADE, WITH ASSOCIATED CALCIFICATIONS.  - NO EVIDENCE OF INVASIVE MAMMARY CARCINOMA.   Comment:  DCIS is present in 2 of multiple tissue cores, spanning up to 4 mm in  greatest linear extent.  Ancillary ER testing is pending, and will be  reported as an addendum.   GROSS DESCRIPTION:  A. Labeled: Right breast stereo biopsy upper outer calcs  Received: in a formalin-filled [place] collection device  Specimen radiograph image(s) available for review  Time/Date in fixative: Collected at 1:24 PM on 09/14/2020 and placed in  formalin at 1:27 PM on 09/14/2020  Cold ischemic time: Less than 5 minutes  Total fixation time: Approximately 6.25 hours  Core pieces: Multiple  Measurement: Aggregate, 7.2 x 1.2 x 0.4 cm  Description / comments: Received are cores and fragments of yellow  fibrofatty tissue.  The accompanying diagram has sections D and E  checked.  Inked: Green  Entirely submitted in cassette(s):   1 - section D  2 - section E  3 - 4 - remaining soft tissue fragments       3 - sections A and B       4 - sections C and F with remaining free-floating fragments   RB 09/14/2020  Final Diagnosis performed by Allena Napoleon, MD.   Electronically signed  09/17/2020 10:32:22AM    Imaging: Radiology review:  CLINICAL DATA:  74 year old female presenting as a recall from screening for possible right breast calcifications. History of remote right breast benign excisional biopsy.   EXAM: DIGITAL DIAGNOSTIC UNILATERAL RIGHT MAMMOGRAM   TECHNIQUE: Right digital diagnostic mammography was performed. Mammographic images were processed with CAD.   COMPARISON:  Previous exam(s).   ACR Breast Density Category c: The breast tissue is heterogeneously dense, which may obscure small masses.   FINDINGS: Spot 2D  magnification and full field mL tomosynthesis views of the right breast were performed. There is persistence of grouped faint pleomorphic calcifications in the upper outer right breast posterior depth spanning approximately 0.5 cm. No associated mass.   IMPRESSION: Indeterminate calcifications spanning 5 mm in the upper outer right breast.   RECOMMENDATION: Stereotactic core needle biopsy of the right breast calcifications.   I have discussed the findings and recommendations with the patient who agrees to proceed with biopsy. The patient will be contacted by our scheduler to arrange the biopsy appointment.   BI-RADS CATEGORY  4: Suspicious.     Electronically Signed   By: Audie Pinto M.D.   On: 09/06/2020 09:40 Within last 24 hrs: No results found.  Assessment    Low-grade right breast DCIS. 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  She is already stated she has no interest in radiation, having researched the benefit in someone her age for this disease process.  I discussed the available options with the patient. I also discussed that given the small size of the cancer would recommend localization lumpectomy.     Explained to the patient that after her surgical treatment, staging/invasive nature of the known cancer could change and therefore additional treatment will depend on her prognostic indicators and stage.    I discussed risks of bleeding, infection, damage to surrounding tissues, having positive margins, needing further resection; as well as anesthesia risks of MI, stroke, prolonged ventilation, pulmonary embolism, thrombosis and even death.   Patient was given the opportunity to ask questions and have them answered.  They would like to  proceed with right breast RFID localized lumpectomy.    Face-to-face time spent with the patient and accompanying care providers(if present) was 30 minutes, with more than 50% of the time spent counseling, educating, and coordinating care of the patient.    These notes generated  with voice recognition software. I apologize for typographical errors.  Ronny Bacon M.D., FACS 09/20/2020, 3:02 PM

## 2020-09-20 NOTE — Progress Notes (Signed)
Patient ID: Tracie Reed, female   DOB: 15-Sep-1945, 75 y.o.   MRN: [id number]  Chief Complaint: DCIS right breast  History of Present Illness Tracie Reed is a 75 y.o. 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  Having no breast complaints or recent issues, this was achieved through screening mammography, subsequent diagnostic mammography.  She had a previous breast biopsy/lumpectomy of some calcifications nearly 3 decades ago for benign disease.  She has had no history of hormone replacement therapy.  She underwent menopause at the age of 59 having begun menstruating at the age of 52.  She has had 2 pregnancies her first delivered at the age of 78.  She has had no changes in her breast no lump, no nipple discharge no skin changes no breast pain she did not even do monthly breast exams.  She has an appointment to see oncology next week. Her family history consists of her mother who had a mastectomy in her 45s.  Past Medical History Past Medical History:  Diagnosis Date   Anemia    Anxiety    Pre-diabetes       Past Surgical History:  Procedure Laterality Date   BREAST BIOPSY Right 09/14/2020   Affirm bx-calcs, "Ribbon" marker-path pending   BREAST EXCISIONAL BIOPSY Right 1993   neg   BUNIONECTOMY Bilateral    COLONOSCOPY WITH PROPOFOL N/A 07/31/2020   Procedure: COLONOSCOPY WITH PROPOFOL;  Surgeon: Tracie Lame, MD;  Location: [facility] ENDOSCOPY;  Service: Endoscopy;  Laterality: N/A;   IRIDOTOMY / IRIDECTOMY Right     No Known Allergies  Current Outpatient Medications  Medication Sig Dispense Refill   acyclovir ointment (ZOVIRAX) 5 % Apply 1 application topically every 3 (three) hours as needed. 15 g 1   calcium carbonate (OS-CAL) 1250 (500 Ca) MG chewable tablet Chew 1 tablet by mouth daily.     cetirizine (ZYRTEC) 10 MG tablet Take 1 tablet (10 mg total) by mouth daily. 90 tablet 3   Diclofenac Sodium 1 % CREA  Apply 1 application topically 4 (four) times daily as needed (right arm pain). 120 g 1   FLUoxetine (PROZAC) 10 MG tablet TAKE 1 TABLET(10 MG) BY MOUTH DAILY 90 tablet 0   latanoprost (XALATAN) 0.005 % ophthalmic solution Administer 1 drop to both eyes nightly.     simvastatin (ZOCOR) 10 MG tablet Take 1 tablet (10 mg total) by mouth at bedtime. 30 tablet 2   traZODone (DESYREL) 50 MG tablet Take 1-2 tablets (50-100 mg total) by mouth at bedtime as needed for sleep. 180 tablet 0   valACYclovir (VALTREX) 1000 MG tablet Take 2 tablets (2,000 mg) at the start of your cold sore.  Take 1 tablet (1,000 mg) daily until sores have resolved. 15 tablet 1   No current facility-administered medications for this visit.    Family History Family History  Problem Relation Age of Onset   Depression Mother    Healthy Brother    Breast cancer Neg Hx       Social History Social History   Tobacco Use   Smoking status: Former    Types: Cigarettes    Quit date: 11/12/1987    Years since quitting: 32.8   Smokeless tobacco: Never  Vaping Use   Vaping Use: Never used  Substance Use Topics   Alcohol use: Not Currently   Drug use: Not Currently    Comment: past marijuana - very remote use  Review of Systems  Constitutional: Negative.   HENT:  Positive for hearing loss.   Eyes: Negative.   Respiratory: Negative.    Cardiovascular: Negative.   Gastrointestinal: Negative.   Genitourinary: Negative.   Skin: Negative.   Neurological: Negative.   Psychiatric/Behavioral: Negative.       Physical Exam Blood pressure 118/78, pulse 78, temperature 98.5 F (36.9 C), temperature source Oral, height '5\' 7"'$  (1.702 m), weight 130 lb 9.6 oz (59.2 kg), SpO2 96 %. Last Weight  Most recent update: 09/20/2020  2:59 PM    Weight  59.2 kg (130 lb 9.6 oz)             CONSTITUTIONAL: Well developed, and nourished, appropriately responsive and aware without distress.   EYES: Sclera non-icteric.    EARS, NOSE, MOUTH AND THROAT: Mask worn.      Hearing is intact to voice.  NECK: Trachea is midline, and there is no jugular venous distension.  LYMPH NODES:  Lymph nodes in the neck are not enlarged. RESPIRATORY:  Lungs are clear, and breath sounds are equal bilaterally. Normal respiratory effort without pathologic use of accessory muscles. CARDIOVASCULAR: Heart is regular in rate and rhythm. GI: The abdomen is soft, nontender, and nondistended.  MUSCULOSKELETAL:  Symmetrical muscle tone appreciated in all four extremities.    SKIN: Skin turgor is normal. No pathologic skin lesions appreciated.  NEUROLOGIC:  Motor and sensation appear grossly normal.  Cranial nerves are grossly without defect. PSYCH:  Alert and oriented to person, place and time. Affect is appropriate for situation.  Data Reviewed I have personally reviewed what is currently available of the patient's imaging, recent labs and medical records.   Labs:  CBC Latest Ref Rng & Units 07/19/2020 07/05/2019 12/15/2017  WBC 3.8 - 10.8 Thousand/uL 7.7 7.0 7.8  Hemoglobin 11.7 - 15.5 g/dL 13.6 15.1 14.4  Hematocrit 35.0 - 45.0 % 42.3 44.5 41.1  Platelets 140 - 400 Thousand/uL 239 252 238   CMP Latest Ref Rng & Units 07/19/2020 07/05/2019 12/15/2017  Glucose 65 - 99 mg/dL 80 122(H) 74  BUN 7 - 25 mg/dL '10 8 9  '$ Creatinine 0.60 - 0.93 mg/dL 0.67 0.67 0.78  Sodium 135 - 146 mmol/L 139 140 140  Potassium 3.5 - 5.3 mmol/L 4.5 4.3 3.9  Chloride 98 - 110 mmol/L 103 103 99  CO2 20 - 32 mmol/L '29 27 23  '$ Calcium 8.6 - 10.4 mg/dL 9.5 9.9 9.6  Total Protein 6.1 - 8.1 g/dL 6.6 6.5 6.8  Total Bilirubin 0.2 - 1.2 mg/dL 0.4 0.5 0.6  AST 10 - 35 U/L 15 18 44(H)  ALT 6 - 29 U/L '13 15 29    '$ SURGICAL PATHOLOGY  CASE: [id number]  PATIENT: Tracie Reed  Surgical Pathology Report   Specimen Submitted:  A. Breast, right upper outer   Clinical History: New grouped calcifications, rule out DCIS.  Ribbon-shaped clip placed following  stereotactic biopsy of RIGHT breast,  upper outer quadrant.   DIAGNOSIS:  A. BREAST, RIGHT UPPER OUTER QUADRANT; STEREOTACTIC CORE NEEDLE BIOPSY:  - DUCTAL CARCINOMA IN SITU, LOW-GRADE, WITH ASSOCIATED CALCIFICATIONS.  - NO EVIDENCE OF INVASIVE MAMMARY CARCINOMA.   Comment:  DCIS is present in 2 of multiple tissue cores, spanning up to 4 mm in  greatest linear extent.  Ancillary ER testing is pending, and will be  reported as an addendum.   GROSS DESCRIPTION:  A. Labeled: Right breast stereo biopsy upper outer calcs  Received: in a formalin-filled [place] collection device  Specimen radiograph image(s) available for review  Time/Date in fixative: Collected at 1:24 PM on 09/14/2020 and placed in  formalin at 1:27 PM on 09/14/2020  Cold ischemic time: Less than 5 minutes  Total fixation time: Approximately 6.25 hours  Core pieces: Multiple  Measurement: Aggregate, 7.2 x 1.2 x 0.4 cm  Description / comments: Received are cores and fragments of yellow  fibrofatty tissue.  The accompanying diagram has sections D and E  checked.  Inked: Green  Entirely submitted in cassette(s):   1 - section D  2 - section E  3 - 4 - remaining soft tissue fragments       3 - sections A and B       4 - sections C and F with remaining free-floating fragments   RB 09/14/2020  Final Diagnosis performed by Allena Napoleon, MD.   Electronically signed  09/17/2020 10:32:22AM    Imaging: Radiology review:  CLINICAL DATA:  75 year old female presenting as a recall from screening for possible right breast calcifications. History of remote right breast benign excisional biopsy.   EXAM: DIGITAL DIAGNOSTIC UNILATERAL RIGHT MAMMOGRAM   TECHNIQUE: Right digital diagnostic mammography was performed. Mammographic images were processed with CAD.   COMPARISON:  Previous exam(s).   ACR Breast Density Category c: The breast tissue is heterogeneously dense, which may obscure small masses.   FINDINGS: Spot 2D  magnification and full field mL tomosynthesis views of the right breast were performed. There is persistence of grouped faint pleomorphic calcifications in the upper outer right breast posterior depth spanning approximately 0.5 cm. No associated mass.   IMPRESSION: Indeterminate calcifications spanning 5 mm in the upper outer right breast.   RECOMMENDATION: Stereotactic core needle biopsy of the right breast calcifications.   I have discussed the findings and recommendations with the patient who agrees to proceed with biopsy. The patient will be contacted by our scheduler to arrange the biopsy appointment.   BI-RADS CATEGORY  4: Suspicious.     Electronically Signed   By: Audie Pinto M.D.   On: 09/06/2020 09:40 Within last 24 hrs: No results found.  Assessment    Low-grade right breast DCIS. 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  She is already stated she has no interest in radiation, having researched the benefit in someone her age for this disease process.  I discussed the available options with the patient. I also discussed that given the small size of the cancer would recommend localization lumpectomy.     Explained to the patient that after her surgical treatment, staging/invasive nature of the known cancer could change and therefore additional treatment will depend on her prognostic indicators and stage.    I discussed risks of bleeding, infection, damage to surrounding tissues, having positive margins, needing further resection; as well as anesthesia risks of MI, stroke, prolonged ventilation, pulmonary embolism, thrombosis and even death.   Patient was given the opportunity to ask questions and have them answered.  They would like to  proceed with right breast RFID localized lumpectomy.    Face-to-face time spent with the patient and accompanying care providers(if present) was 30 minutes, with more than 50% of the time spent counseling, educating, and coordinating care of the patient.    These notes generated  with voice recognition software. I apologize for typographical errors.  Ronny Bacon M.D., FACS 09/20/2020, 3:02 PM

## 2020-09-21 LAB — SURGICAL PATHOLOGY

## 2020-09-24 ENCOUNTER — Telehealth: Payer: Self-pay | Admitting: Surgery

## 2020-09-24 NOTE — Telephone Encounter (Signed)
Patient has been advised of Pre-Admission date/time, COVID Testing date and Surgery date.  Surgery Date: 10/12/20 Preadmission Testing Date: 10/05/20 (phone 8a-1p) Covid Testing Date: Not needed.     Patient has been made aware to call [phone], between 1-3:00pm the day before surgery, to find out what time to arrive for surgery.   Patient also reminded of her RF tag to be placed at the [facility] for 10/02/20.   Patient verbalized understanding with the above.

## 2020-09-25 ENCOUNTER — Inpatient Hospital Stay: Payer: Medicare Other | Admitting: Oncology

## 2020-09-25 ENCOUNTER — Encounter: Payer: Self-pay | Admitting: Oncology

## 2020-09-25 ENCOUNTER — Inpatient Hospital Stay: Payer: Medicare Other

## 2020-09-25 ENCOUNTER — Other Ambulatory Visit: Payer: Self-pay

## 2020-09-25 ENCOUNTER — Inpatient Hospital Stay: Payer: Medicare Other | Attending: Oncology | Admitting: Oncology

## 2020-09-25 ENCOUNTER — Other Ambulatory Visit: Payer: Medicare Other

## 2020-09-25 VITALS — BP 102/71 | HR 77 | Temp 97.9°F | Resp 17 | Wt 130.0 lb

## 2020-09-25 DIAGNOSIS — Z9049 Acquired absence of other specified parts of digestive tract: Secondary | ICD-10-CM | POA: Diagnosis not present

## 2020-09-25 DIAGNOSIS — F419 Anxiety disorder, unspecified: Secondary | ICD-10-CM | POA: Insufficient documentation

## 2020-09-25 DIAGNOSIS — Z87891 Personal history of nicotine dependence: Secondary | ICD-10-CM | POA: Diagnosis not present

## 2020-09-25 DIAGNOSIS — E7800 Pure hypercholesterolemia, unspecified: Secondary | ICD-10-CM

## 2020-09-25 DIAGNOSIS — M8580 Other specified disorders of bone density and structure, unspecified site: Secondary | ICD-10-CM | POA: Insufficient documentation

## 2020-09-25 DIAGNOSIS — Z818 Family history of other mental and behavioral disorders: Secondary | ICD-10-CM | POA: Diagnosis not present

## 2020-09-25 DIAGNOSIS — Z79899 Other long term (current) drug therapy: Secondary | ICD-10-CM

## 2020-09-25 DIAGNOSIS — D0511 Intraductal carcinoma in situ of right breast: Secondary | ICD-10-CM | POA: Insufficient documentation

## 2020-09-25 DIAGNOSIS — Z803 Family history of malignant neoplasm of breast: Secondary | ICD-10-CM | POA: Insufficient documentation

## 2020-09-25 DIAGNOSIS — Z833 Family history of diabetes mellitus: Secondary | ICD-10-CM | POA: Insufficient documentation

## 2020-09-25 DIAGNOSIS — Z7189 Other specified counseling: Secondary | ICD-10-CM

## 2020-09-25 NOTE — Progress Notes (Signed)
Patient here for initial oncology appointment, expresses no complaints or concerns at this time.   

## 2020-09-30 NOTE — Progress Notes (Signed)
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  This was followed by diagnostic mammogram which showed pleomorphic calcifications in the upper outer right breast spanning 0.5 cm.  History of prior breast biopsy several years ago.  Menopause at the age of 74 and menarche 19.  She is G2, P2 A2.  Family history of breast cancer in her mother in her 46s.  ECOG PS- 1  Pain scale- 0   Review of systems- Review of Systems  Constitutional:  Negative for chills, fever, malaise/fatigue and weight loss.  HENT:  Negative for congestion, ear discharge and nosebleeds.   Eyes:  Negative for blurred vision.  Respiratory:  Negative for cough, hemoptysis, sputum production, shortness of breath and wheezing.   Cardiovascular:  Negative for chest pain, palpitations, orthopnea and claudication.  Gastrointestinal:  Negative for abdominal pain, blood in stool, constipation, diarrhea, heartburn, melena, nausea and vomiting.  Genitourinary:  Negative for dysuria, flank pain, frequency, hematuria and urgency.  Musculoskeletal:  Negative for back pain, joint pain and myalgias.  Skin:  Negative for rash.  Neurological:  Negative for dizziness, tingling, focal weakness, seizures, weakness and headaches.  Endo/Heme/Allergies:  Does not bruise/bleed easily.  Psychiatric/Behavioral:  Negative for depression and suicidal ideas.  The patient does not have insomnia.    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     Past Medical History:  Diagnosis Date   Anxiety    History of cataract    Pre-diabetes      Past Surgical History:  Procedure Laterality Date   BREAST BIOPSY Right 09/14/2020   Affirm bx-calcs, "Ribbon" marker-path pending   BREAST EXCISIONAL BIOPSY Right 1993   neg   BUNIONECTOMY Bilateral    CATARACT EXTRACTION  09/03/2020   CHOLECYSTECTOMY     COLONOSCOPY WITH PROPOFOL N/A 07/31/2020   Procedure: COLONOSCOPY WITH PROPOFOL;  Surgeon: Lucilla Lame, MD;  Location: [facility] ENDOSCOPY;  Service: Endoscopy;  Laterality: N/A;   IRIDOTOMY / IRIDECTOMY Right     Social History   Socioeconomic History   Marital status: Significant Other    Spouse name: Not on file   Number of children: 2   Years of education: Not on file   Highest education level: Bachelor's degree (e.g., BA, AB, BS)  Occupational History   Occupation: self employed    Comment: Holiday representative  Tobacco Use   Smoking status: Former    Types: Cigarettes    Quit date: 11/12/1987    Years since quitting: 32.9   Smokeless tobacco: Never  Vaping Use   Vaping Use: Never used  Substance and Sexual Activity   Alcohol use: Not Currently   Drug use: Not Currently    Comment: past marijuana - very remote  use   Sexual activity: Yes  Other Topics Concern   Not on file  Social History Narrative   Not on file   Social Determinants of Health   Financial Resource Strain: Low Risk    Difficulty of Paying Living Expenses: Not hard at all  Food Insecurity: No Food Insecurity   Worried About Charity fundraiser in the Last Year: Never true   [place] in the Last  Year: Never true  Transportation Needs: No Transportation Needs   Lack of Transportation (Medical): No   Lack of Transportation (Non-Medical): No  Physical Activity: Sufficiently Active   Days of Exercise per Week: 7 days   Minutes of Exercise per Session: 30 min  Stress: No Stress Concern Present   Feeling of Stress : Not at all  Social Connections: Not on file  Intimate Partner Violence: Not on file     Family History  Problem Relation Age of Onset   Breast cancer Mother    Diabetes Mother    Depression Mother    Healthy Brother      Current Outpatient Medications:    acyclovir ointment (ZOVIRAX) 5 %, Apply 1 application topically every 3 (three) hours as needed., Disp: 15 g, Rfl: 1   calcium carbonate (OS-CAL) 1250 (500 Ca) MG chewable tablet, Chew 1 tablet by mouth daily., Disp: , Rfl:    cetirizine (ZYRTEC) 10 MG tablet, Take 1 tablet (10 mg total) by mouth daily., Disp: 90 tablet, Rfl: 3   Diclofenac Sodium 1 % CREA, Apply 1 application topically 4 (four) times daily as needed (right arm pain)., Disp: 120 g, Rfl: 1   FLUoxetine (PROZAC) 10 MG tablet, TAKE 1 TABLET(10 MG) BY MOUTH DAILY, Disp: 90 tablet, Rfl: 0   latanoprost (XALATAN) 0.005 % ophthalmic solution, Administer 1 drop to both eyes nightly., Disp: , Rfl:    simvastatin (ZOCOR) 10 MG tablet, Take 1 tablet (10 mg total) by mouth at bedtime., Disp: 30 tablet, Rfl: 2   traZODone (DESYREL) 50 MG tablet, Take 1-2 tablets (50-100 mg total) by mouth at bedtime as needed for sleep., Disp: 180 tablet, Rfl: 0   valACYclovir (VALTREX) 1000 MG tablet, Take 2 tablets (2,000 mg) at the start of your cold sore.  Take 1 tablet (1,000 mg) daily until sores have resolved., Disp: 15 tablet, Rfl: 1   Physical exam:  Vitals:   09/25/20 1101  BP: 102/71  Pulse: 77  Resp: 17  Temp: 97.9 F (36.6 C)  TempSrc: Tympanic  SpO2: 99%  Weight: 130 lb (59 kg)   Physical Exam Constitutional:      General: She is not in acute  distress. Cardiovascular:     Rate and Rhythm: Normal rate and regular rhythm.     Heart sounds: Normal heart sounds.  Pulmonary:     Effort: Pulmonary effort is normal.     Breath sounds: Normal breath sounds.  Abdominal:     General: Bowel sounds are normal.     Palpations: Abdomen is soft.  Skin:    General: Skin is warm and dry.  Neurological:     Mental Status: She is alert and oriented to person, place, and time.   Breast exam: Breast exam was performed in seated and lying down position. Patient is status post right lumpectomy with a well-healed surgical scar. No evidence of any palpable masses. No evidence of axillary adenopathy. No evidence of any palpable masses or lumps in the left breast. No evidence of  leftt axillary adenopathy CMP Latest Ref Rng & Units 07/19/2020  Glucose 65 - 99 mg/dL 80  BUN 7 - 25 mg/dL 10  Creatinine 0.60 - 0.93 mg/dL 0.67  Sodium 135 - 146 mmol/L 139  Potassium 3.5 - 5.3 mmol/L 4.5  Chloride 98 - 110 mmol/L 103  CO2 20 - 32 mmol/L 29  Calcium 8.6 - 10.4 mg/dL 9.5  Total Protein 6.1 - 8.1 g/dL 6.6  Total Bilirubin 0.2 - 1.2 mg/dL 0.4  AST 10 - 35 U/L 15  ALT 6 - 29 U/L 13   CBC Latest Ref Rng & Units 07/19/2020  WBC 3.8 - 10.8 Thousand/uL 7.7  Hemoglobin 11.7 - 15.5 g/dL 13.6  Hematocrit 35.0 - 45.0 % 42.3  Platelets 140 - 400 Thousand/uL 239    No images are attached to the encounter.  MM Digital Diagnostic Unilat R  Result Date: 09/06/2020 CLINICAL DATA:  75 year old female presenting as a recall from screening for possible right breast calcifications. History of remote right breast benign excisional biopsy. EXAM: DIGITAL DIAGNOSTIC UNILATERAL RIGHT MAMMOGRAM TECHNIQUE: Right digital diagnostic mammography was performed. Mammographic images were processed with CAD. COMPARISON:  Previous exam(s). ACR Breast Density Category c: The breast tissue is heterogeneously dense, which may obscure small masses. FINDINGS: Spot 2D magnification and full  field mL tomosynthesis views of the right breast were performed. There is persistence of grouped faint pleomorphic calcifications in the upper outer right breast posterior depth spanning approximately 0.5 cm. No associated mass. IMPRESSION: Indeterminate calcifications spanning 5 mm in the upper outer right breast. RECOMMENDATION: Stereotactic core needle biopsy of the right breast calcifications. I have discussed the findings and recommendations with the patient who agrees to proceed with biopsy. The patient will be contacted by our scheduler to arrange the biopsy appointment. BI-RADS CATEGORY  4: Suspicious. Electronically Signed   By: Audie Pinto M.D.   On: 09/06/2020 09:40  MM CLIP PLACEMENT RIGHT  Result Date: 09/14/2020 CLINICAL DATA:  Postprocedure mammogram. EXAM: 3D DIAGNOSTIC RIGHT MAMMOGRAM POST STEREOTACTIC BIOPSY COMPARISON:  Previous exam(s). FINDINGS: 3D Mammographic images were obtained following stereotactic guided biopsy of calcifications in the upper outer right breast. The biopsy marking clip is displaced approximately 0.5 cm medially from residual calcifications (CC frame 33/46) in the upper outer right breast at middle depth. IMPRESSION: Ribbon shaped biopsy marking clip is placed approximately 0.5 cm medially from residual calcifications in the upper outer right breast at middle depth. Final Assessment: Post Procedure Mammograms for Marker Placement Electronically Signed   By: Ileana Roup M.D.   On: 09/14/2020 13:54  MM RT BREAST BX W LOC DEV 1ST LESION IMAGE BX SPEC STEREO GUIDE  Addendum Date: 09/17/2020   ADDENDUM REPORT: 09/17/2020 15:08 ADDENDUM: PATHOLOGY revealed: A. BREAST, RIGHT UPPER OUTER QUADRANT; STEREOTACTIC CORE NEEDLE BIOPSY: - DUCTAL CARCINOMA IN SITU, LOW-GRADE, WITH ASSOCIATED CALCIFICATIONS. - NO EVIDENCE OF INVASIVE MAMMARY CARCINOMA. Comment: DCIS is present in 2 of multiple tissue cores, spanning up to 4 mm in greatest linear extent. Pathology results are  CONCORDANT with imaging findings, per Dr. Ileana Roup. Pathology results and recommendations were discussed with patient via telephone on 09/17/2020. Patient reported doing well after the biopsy with no adverse symptoms, and only slight tenderness at the site. Post biopsy care instructions were reviewed, questions were answered and my direct phone number was provided. Patient was instructed to call [facility] for any additional questions or concerns related to biopsy site. Recommend surgical consultation: Request for surgical consultation relayed to [facility]  [place] Carbo RN and Tanya Nones RN at [facility] by Electa Sniff RN on 09/17/2020. Pathology results reported by Electa Sniff RN on 09/17/2020. Electronically Signed   By: Ileana Roup M.D.   On: 09/17/2020 15:08   Result Date: 09/17/2020 CLINICAL DATA:  Grouped calcifications in the upper outer right breast. EXAM: RIGHT BREAST STEREOTACTIC CORE NEEDLE BIOPSY COMPARISON:  Previous exams. FINDINGS: The patient and I discussed the procedure of stereotactic-guided biopsy including benefits and alternatives. We discussed the high likelihood of a successful procedure. We discussed the risks of the procedure including infection, bleeding, tissue injury, clip migration, and inadequate sampling. Informed written consent was given. The usual time out protocol was performed immediately prior to the procedure. Using sterile technique and 1% Lidocaine as local anesthetic, under stereotactic guidance, a 9 gauge vacuum assisted device was used to perform core needle biopsy of calcifications in the upper-outer quadrant of the right breast using a lateral approach. Specimen radiograph was performed showing representative calcifications. Specimens with calcifications are identified for pathology. Lesion quadrant: Upper outer quadrant At the conclusion of the procedure, a ribbon shaped tissue marker clip was deployed into the biopsy cavity. Follow-up 2-view  mammogram was performed and dictated separately. IMPRESSION: Stereotactic-guided biopsy of grouped calcifications in the upper-outer right breast. No apparent complications. Electronically Signed: By: Ileana Roup M.D. On: 09/14/2020 13:39    Assessment and plan- Patient is a 75 y.o. 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  Discussed that standard of care for DCIS would be upfront lumpectomy followed by adjuvant radiation treatment and adjuvant hormone therapy.  Also discussed possibility of enrollment in the comet trial which randomized patients to either surgery or observation with frequent mammograms.  Patient is not interested in enrolling into the comet trial as she definitely wants to pursue surgery.  Patient is not sure if she wants to pursue any adjuvant radiation treatment or hormone therapy.  We discussed the [facility] DCIS algorithm that if the patient was found to have low-grade DCIS with negative margins at the time of final lumpectomy-as per the algorithm her risk of recurrence at 5 years and 10 years would be 7 and 11% respectively.  This would be brought down to 3 and 4% by adjuvant radiation treatment and further down to 1 and 2% by hormonal therapy.     Discussed the risks of endocrine therapy for DCIS including all but not limited to fatigue, hot flashes, arthralgias and worsening bone health associated with aromatase inhibitors.  It is typically given once a day for 5 years.  Treatment will be given with a curative intent.Patient understands and will think about her options post surgery to decide what she wants to proceed.    I will see her back roughly end of September to discuss further management   Thank you for this kind referral and the opportunity to participate in the care of this patient   Visit Diagnosis 1. Neoplasm of right  breast, primary tumor staging category Tis: ductal carcinoma in situ (DCIS)   2. Goals of care, counseling/discussion     Dr. Randa Evens, MD, MPH [facility] [id number] 09/30/2020 7:46 AM

## 2020-10-02 ENCOUNTER — Ambulatory Visit
Admission: RE | Admit: 2020-10-02 | Discharge: 2020-10-02 | Disposition: A | Discharge status: Home / Self Care | Payer: Medicare Other | Source: Ambulatory Visit | Attending: Surgery | Admitting: Surgery

## 2020-10-02 ENCOUNTER — Other Ambulatory Visit: Payer: Medicare Other

## 2020-10-02 ENCOUNTER — Other Ambulatory Visit: Payer: Self-pay

## 2020-10-02 ENCOUNTER — Ambulatory Visit: Payer: Medicare Other | Admitting: Oncology

## 2020-10-02 DIAGNOSIS — D0511 Intraductal carcinoma in situ of right breast: Secondary | ICD-10-CM | POA: Diagnosis not present

## 2020-10-05 ENCOUNTER — Other Ambulatory Visit: Payer: Self-pay

## 2020-10-05 ENCOUNTER — Encounter
Admission: RE | Admit: 2020-10-05 | Discharge: 2020-10-05 | Disposition: A | Discharge status: Home / Self Care | Payer: Medicare Other | Source: Ambulatory Visit | Attending: Surgery | Admitting: Surgery

## 2020-10-05 HISTORY — DX: Sleep apnea, unspecified: G47.30

## 2020-10-05 HISTORY — DX: Intraductal carcinoma in situ of unspecified breast: D05.10

## 2020-10-05 NOTE — Patient Instructions (Addendum)
Your procedure is scheduled on:10-12-20 Friday Report to the Registration Desk on the 1st floor of the [facility]. Then proceed to the 2nd floor Surgery Desk in the [facility] To find out your arrival time, please call [phone] between 1PM - 3PM on:10-11-20 Thursday  REMEMBER: Instructions that are not followed completely may result in serious medical risk, up to and including death; or upon the discretion of your surgeon and anesthesiologist your surgery may need to be rescheduled.  Do not eat food after midnight the night before surgery.  No gum chewing, lozengers or hard candies.  You may however, drink CLEAR liquids up to 2 hours before you are scheduled to arrive for your surgery. Do not drink anything within 2 hours of your scheduled arrival time.  Clear liquids include: - water  - apple juice without pulp - gatorade (not RED, PURPLE, OR BLUE) - black coffee or tea (Do NOT add milk or creamers to the coffee or tea) Do NOT drink anything that is not on this list.  TAKE THESE MEDICATIONS THE MORNING OF SURGERY WITH A SIP OF WATER: -Prozac (Fluoxetine)  One week prior to surgery: Stop Anti-inflammatories (NSAIDS) such as Advil, Aleve, Ibuprofen, Motrin, Naproxen, Naprosyn and Aspirin based products such as Excedrin, Goodys Powder, BC Powder.You may however, continue to take Tylenol if needed for pain up until the day of surgery.  Stop ANY OVER THE COUNTER supplements/vitamins NOW (10-05-20) until after surgery (Calcium-Vitamin D)  No Alcohol for 24 hours before or after surgery.  No Smoking including e-cigarettes for 24 hours prior to surgery.  No chewable tobacco products for at least 6 hours prior to surgery.  No nicotine patches on the day of surgery.  Do not use any "recreational" drugs for at least a week prior to your surgery.  Please be advised that the combination of cocaine and anesthesia may have negative outcomes, up to and including death. If you test positive  for cocaine, your surgery will be cancelled.  On the morning of surgery brush your teeth with toothpaste and water, you may rinse your mouth with mouthwash if you wish. Do not swallow any toothpaste or mouthwash.  Do not wear jewelry, make-up, hairpins, clips or nail polish.  Do not wear lotions, powders, or perfumes.   Do not shave body from the neck down 48 hours prior to surgery just in case you cut yourself which could leave a site for infection.  Also, freshly shaved skin may become irritated if using the CHG soap.  Contact lenses, hearing aids and dentures may not be worn into surgery.  Do not bring valuables to the hospital. [facility] is not responsible for any missing/lost belongings or valuables.   Use CHG Soap as directed on instruction sheet  Notify your doctor if there is any change in your medical condition (cold, fever, infection).  Wear comfortable clothing (specific to your surgery type) to the hospital.  After surgery, you can help prevent lung complications by doing breathing exercises.  Take deep breaths and cough every 1-2 hours. Your doctor may order a device called an Incentive Spirometer to help you take deep breaths. When coughing or sneezing, hold a pillow firmly against your incision with both hands. This is called "splinting." Doing this helps protect your incision. It also decreases belly discomfort.  If you are being admitted to the hospital overnight, leave your suitcase in the car. After surgery it may be brought to your room.  If you are being discharged  the day of surgery, you will not be allowed to drive home. You will need a responsible adult (18 years or older) to drive you home and stay with you that night.   If you are taking public transportation, you will need to have a responsible adult (18 years or older) with you. Please confirm with your physician that it is acceptable to use public transportation.   Please call the [place] Dept. at [phone] if you have any questions about these instructions.  Surgery Visitation Policy:  Patients undergoing a surgery or procedure may have one family member or support person with them as long as that person is not COVID-19 positive or experiencing its symptoms.  That person may remain in the waiting area during the procedure.  Inpatient Visitation:    Visiting hours are 7 a.m. to 8 p.m. Inpatients will be allowed two visitors daily. The visitors may change each day during the patient's stay. No visitors under the age of 68. Any visitor under the age of 108 must be accompanied by an adult. The visitor must pass COVID-19 screenings, use hand sanitizer when entering and exiting the patient's room and wear a mask at all times, including in the patient's room. Patients must also wear a mask when staff or their visitor are in the room. Masking is required regardless of vaccination status.

## 2020-10-09 ENCOUNTER — Other Ambulatory Visit: Payer: Self-pay

## 2020-10-09 ENCOUNTER — Encounter
Admission: RE | Admit: 2020-10-09 | Discharge: 2020-10-09 | Disposition: A | Discharge status: Home / Self Care | Payer: Medicare Other | Source: Ambulatory Visit | Attending: Surgery | Admitting: Surgery

## 2020-10-09 DIAGNOSIS — Z01818 Encounter for other preprocedural examination: Secondary | ICD-10-CM | POA: Diagnosis not present

## 2020-10-09 DIAGNOSIS — Z01810 Encounter for preprocedural cardiovascular examination: Secondary | ICD-10-CM | POA: Diagnosis not present

## 2020-10-09 LAB — CBC WITH DIFFERENTIAL/PLATELET
Abs Immature Granulocytes: 0.01 10*3/uL (ref 0.00–0.07)
Basophils Absolute: 0 10*3/uL (ref 0.0–0.1)
Basophils Relative: 1 %
Eosinophils Absolute: 0.1 10*3/uL (ref 0.0–0.5)
Eosinophils Relative: 1 %
HCT: 40.1 % (ref 36.0–46.0)
Hemoglobin: 13.3 g/dL (ref 12.0–15.0)
Immature Granulocytes: 0 %
Lymphocytes Relative: 31 %
Lymphs Abs: 2 10*3/uL (ref 0.7–4.0)
MCH: 29.8 pg (ref 26.0–34.0)
MCHC: 33.2 g/dL (ref 30.0–36.0)
MCV: 89.9 fL (ref 80.0–100.0)
Monocytes Absolute: 0.5 10*3/uL (ref 0.1–1.0)
Monocytes Relative: 7 %
Neutro Abs: 3.9 10*3/uL (ref 1.7–7.7)
Neutrophils Relative %: 60 %
Platelets: 209 10*3/uL (ref 150–400)
RBC: 4.46 MIL/uL (ref 3.87–5.11)
RDW: 13.9 % (ref 11.5–15.5)
WBC: 6.5 10*3/uL (ref 4.0–10.5)
nRBC: 0 % (ref 0.0–0.2)

## 2020-10-09 LAB — COMPREHENSIVE METABOLIC PANEL
ALT: 18 U/L (ref 0–44)
AST: 22 U/L (ref 15–41)
Albumin: 4 g/dL (ref 3.5–5.0)
Alkaline Phosphatase: 59 U/L (ref 38–126)
Anion gap: 8 (ref 5–15)
BUN: 8 mg/dL (ref 8–23)
CO2: 28 mmol/L (ref 22–32)
Calcium: 9.1 mg/dL (ref 8.9–10.3)
Chloride: 103 mmol/L (ref 98–111)
Creatinine, Ser: 0.52 mg/dL (ref 0.44–1.00)
GFR, Estimated: >60 mL/min (ref >60)
Glucose, Bld: 104 mg/dL — ABNORMAL HIGH (ref 70–99)
Potassium: 4.1 mmol/L (ref 3.5–5.1)
Sodium: 139 mmol/L (ref 135–145)
Total Bilirubin: 0.7 mg/dL (ref 0.3–1.2)
Total Protein: 6.3 g/dL — ABNORMAL LOW (ref 6.5–8.1)

## 2020-10-11 DIAGNOSIS — H524 Presbyopia: Secondary | ICD-10-CM | POA: Diagnosis not present

## 2020-10-11 DIAGNOSIS — Z961 Presence of intraocular lens: Secondary | ICD-10-CM | POA: Diagnosis not present

## 2020-10-12 ENCOUNTER — Ambulatory Visit
Admission: RE | Admit: 2020-10-12 | Discharge: 2020-10-12 | Disposition: A | Discharge status: Home / Self Care | Payer: Medicare Other | Source: Ambulatory Visit | Attending: Surgery | Admitting: Surgery

## 2020-10-12 ENCOUNTER — Other Ambulatory Visit: Payer: Self-pay

## 2020-10-12 ENCOUNTER — Ambulatory Visit
Admission: RE | Admit: 2020-10-12 | Discharge: 2020-10-12 | Disposition: A | Discharge status: Home / Self Care | Payer: Medicare Other | Attending: Surgery | Admitting: Surgery

## 2020-10-12 ENCOUNTER — Encounter: Payer: Self-pay | Admitting: Surgery

## 2020-10-12 ENCOUNTER — Ambulatory Visit: Payer: Medicare Other | Admitting: Urgent Care

## 2020-10-12 ENCOUNTER — Encounter
Admission: RE | Disposition: A | Discharge status: Home / Self Care | Payer: Self-pay | Source: Home / Self Care | Attending: Surgery

## 2020-10-12 DIAGNOSIS — R7303 Prediabetes: Secondary | ICD-10-CM | POA: Diagnosis not present

## 2020-10-12 DIAGNOSIS — D0511 Intraductal carcinoma in situ of right breast: Secondary | ICD-10-CM | POA: Diagnosis not present

## 2020-10-12 DIAGNOSIS — Z87891 Personal history of nicotine dependence: Secondary | ICD-10-CM | POA: Diagnosis not present

## 2020-10-12 DIAGNOSIS — C50911 Malignant neoplasm of unspecified site of right female breast: Secondary | ICD-10-CM | POA: Diagnosis present

## 2020-10-12 DIAGNOSIS — R921 Mammographic calcification found on diagnostic imaging of breast: Secondary | ICD-10-CM | POA: Diagnosis not present

## 2020-10-12 DIAGNOSIS — Z79899 Other long term (current) drug therapy: Secondary | ICD-10-CM | POA: Diagnosis not present

## 2020-10-12 DIAGNOSIS — Z170 Estrogen receptor positive status [ER+]: Secondary | ICD-10-CM | POA: Insufficient documentation

## 2020-10-12 DIAGNOSIS — F419 Anxiety disorder, unspecified: Secondary | ICD-10-CM | POA: Diagnosis not present

## 2020-10-12 HISTORY — PX: BREAST LUMPECTOMY WITH RADIOFREQUENCY TAG IDENTIFICATION: SHX6884

## 2020-10-12 HISTORY — PX: BREAST LUMPECTOMY: SHX2

## 2020-10-12 SURGERY — BREAST LUMPECTOMY WITH RADIOFREQUENCY TAG IDENTIFICATION
Anesthesia: General | Site: Breast | Laterality: Right

## 2020-10-12 MED ORDER — BUPIVACAINE-EPINEPHRINE (PF) 0.25% -1:200000 IJ SOLN
INTRAMUSCULAR | Status: DC | PRN
  Administered 2020-10-12: 20 mL

## 2020-10-12 MED ORDER — LACTATED RINGERS IV SOLN: INTRAVENOUS | Status: DC

## 2020-10-12 MED ORDER — ACETAMINOPHEN 500 MG PO TABS
ORAL_TABLET | ORAL | Status: AC
  Administered 2020-10-12: 1000 mg via ORAL
  Filled 2020-10-12: qty 2

## 2020-10-12 MED ORDER — CHLORHEXIDINE GLUCONATE 0.12 % MT SOLN: 15.0000 mL | Freq: Once | OROMUCOSAL | Status: AC

## 2020-10-12 MED ORDER — ORAL CARE MOUTH RINSE: 15.0000 mL | Freq: Once | OROMUCOSAL | Status: AC

## 2020-10-12 MED ORDER — DEXAMETHASONE SODIUM PHOSPHATE 10 MG/ML IJ SOLN
INTRAMUSCULAR | Status: DC | PRN
  Administered 2020-10-12: 10 mg via INTRAVENOUS

## 2020-10-12 MED ORDER — PROPOFOL 10 MG/ML IV BOLUS
INTRAVENOUS | Status: DC | PRN
  Administered 2020-10-12: 100 mg via INTRAVENOUS

## 2020-10-12 MED ORDER — CEFAZOLIN SODIUM-DEXTROSE 2-4 GM/100ML-% IV SOLN
INTRAVENOUS | Status: AC
  Filled 2020-10-12: qty 100

## 2020-10-12 MED ORDER — CEFAZOLIN SODIUM-DEXTROSE 2-4 GM/100ML-% IV SOLN
2.0000 g | INTRAVENOUS | Status: AC
  Administered 2020-10-12: 2 g via INTRAVENOUS

## 2020-10-12 MED ORDER — CHLORHEXIDINE GLUCONATE CLOTH 2 % EX PADS
6.0000 | MEDICATED_PAD | Freq: Once | CUTANEOUS | Status: AC
  Administered 2020-10-12: 6 via TOPICAL

## 2020-10-12 MED ORDER — IBUPROFEN 800 MG PO TABS: 800.0000 mg | ORAL_TABLET | Freq: Three times a day (TID) | ORAL | 0 refills | Status: AC | PRN

## 2020-10-12 MED ORDER — FENTANYL CITRATE (PF) 100 MCG/2ML IJ SOLN
INTRAMUSCULAR | Status: DC | PRN
  Administered 2020-10-12 (×2): 50 ug via INTRAVENOUS

## 2020-10-12 MED ORDER — ISOSULFAN BLUE 1 % ~~LOC~~ SOLN
SUBCUTANEOUS | Status: AC
  Filled 2020-10-12: qty 5

## 2020-10-12 MED ORDER — FAMOTIDINE 20 MG PO TABS: 20.0000 mg | ORAL_TABLET | Freq: Once | ORAL | Status: AC

## 2020-10-12 MED ORDER — BUPIVACAINE LIPOSOME 1.3 % IJ SUSP
INTRAMUSCULAR | Status: AC
  Filled 2020-10-12: qty 20

## 2020-10-12 MED ORDER — HYDROCODONE-ACETAMINOPHEN 5-325 MG PO TABS: 1.0000 | ORAL_TABLET | Freq: Four times a day (QID) | ORAL | 0 refills | Status: DC | PRN

## 2020-10-12 MED ORDER — ONDANSETRON HCL 4 MG/2ML IJ SOLN
INTRAMUSCULAR | Status: DC | PRN
  Administered 2020-10-12: 4 mg via INTRAVENOUS

## 2020-10-12 MED ORDER — CELECOXIB 200 MG PO CAPS
ORAL_CAPSULE | ORAL | Status: AC
  Administered 2020-10-12: 200 mg via ORAL
  Filled 2020-10-12: qty 1

## 2020-10-12 MED ORDER — CHLORHEXIDINE GLUCONATE CLOTH 2 % EX PADS: 6.0000 | MEDICATED_PAD | Freq: Once | CUTANEOUS | Status: DC

## 2020-10-12 MED ORDER — EPHEDRINE SULFATE 50 MG/ML IJ SOLN
INTRAMUSCULAR | Status: DC | PRN
  Administered 2020-10-12: 5 mg via INTRAVENOUS
  Administered 2020-10-12: 10 mg via INTRAVENOUS
  Administered 2020-10-12 (×3): 5 mg via INTRAVENOUS
  Administered 2020-10-12 (×2): 10 mg via INTRAVENOUS

## 2020-10-12 MED ORDER — FENTANYL CITRATE (PF) 100 MCG/2ML IJ SOLN: 25.0000 ug | INTRAMUSCULAR | Status: DC | PRN

## 2020-10-12 MED ORDER — GABAPENTIN 300 MG PO CAPS
ORAL_CAPSULE | ORAL | Status: AC
  Administered 2020-10-12: 300 mg via ORAL
  Filled 2020-10-12: qty 1

## 2020-10-12 MED ORDER — MIDAZOLAM HCL 2 MG/2ML IJ SOLN
INTRAMUSCULAR | Status: AC
  Filled 2020-10-12: qty 2

## 2020-10-12 MED ORDER — STERILE WATER FOR IRRIGATION IR SOLN
Status: DC | PRN
  Administered 2020-10-12: 1000 mL

## 2020-10-12 MED ORDER — FENTANYL CITRATE (PF) 100 MCG/2ML IJ SOLN
INTRAMUSCULAR | Status: AC
  Filled 2020-10-12: qty 2

## 2020-10-12 MED ORDER — BUPIVACAINE-EPINEPHRINE (PF) 0.25% -1:200000 IJ SOLN
INTRAMUSCULAR | Status: AC
  Filled 2020-10-12: qty 30

## 2020-10-12 MED ORDER — LIDOCAINE HCL (CARDIAC) PF 100 MG/5ML IV SOSY
PREFILLED_SYRINGE | INTRAVENOUS | Status: DC | PRN
  Administered 2020-10-12: 60 mg via INTRAVENOUS

## 2020-10-12 MED ORDER — GABAPENTIN 300 MG PO CAPS: 300.0000 mg | ORAL_CAPSULE | ORAL | Status: AC

## 2020-10-12 MED ORDER — ONDANSETRON HCL 4 MG/2ML IJ SOLN: 4.0000 mg | Freq: Once | INTRAMUSCULAR | Status: DC | PRN

## 2020-10-12 MED ORDER — CHLORHEXIDINE GLUCONATE 0.12 % MT SOLN
OROMUCOSAL | Status: AC
  Administered 2020-10-12: 15 mL via OROMUCOSAL
  Filled 2020-10-12: qty 15

## 2020-10-12 MED ORDER — ACETAMINOPHEN 500 MG PO TABS: 1000.0000 mg | ORAL_TABLET | ORAL | Status: AC

## 2020-10-12 MED ORDER — CELECOXIB 200 MG PO CAPS: 200.0000 mg | ORAL_CAPSULE | ORAL | Status: AC

## 2020-10-12 MED ORDER — FAMOTIDINE 20 MG PO TABS
ORAL_TABLET | ORAL | Status: AC
  Administered 2020-10-12: 20 mg via ORAL
  Filled 2020-10-12: qty 1

## 2020-10-12 SURGICAL SUPPLY — 42 items
ADH SKN CLS APL DERMABOND .7 (GAUZE/BANDAGES/DRESSINGS) ×1
APL PRP STRL LF DISP 70% ISPRP (MISCELLANEOUS) ×1
APPLIER CLIP 9.375 SM OPEN (CLIP)
APR CLP SM 9.3 20 MLT OPN (CLIP)
BLADE SURG 15 STRL LF DISP TIS (BLADE) ×1 IMPLANT
BLADE SURG 15 STRL SS (BLADE) ×2
CHLORAPREP W/TINT 26 (MISCELLANEOUS) ×2 IMPLANT
CLIP APPLIE 9.375 SM OPEN (CLIP) IMPLANT
CNTNR SPEC 2.5X3XGRAD LEK (MISCELLANEOUS)
CONT SPEC 4OZ STER OR WHT (MISCELLANEOUS)
CONT SPEC 4OZ STRL OR WHT (MISCELLANEOUS)
CONTAINER SPEC 2.5X3XGRAD LEK (MISCELLANEOUS) IMPLANT
DECANTER SPIKE VIAL GLASS SM (MISCELLANEOUS) ×2 IMPLANT
DERMABOND ADVANCED (GAUZE/BANDAGES/DRESSINGS) ×1
DERMABOND ADVANCED .7 DNX12 (GAUZE/BANDAGES/DRESSINGS) ×1 IMPLANT
DEVICE DUBIN SPECIMEN MAMMOGRA (MISCELLANEOUS) ×2 IMPLANT
DRAPE LAPAROTOMY TRNSV 106X77 (MISCELLANEOUS) ×2 IMPLANT
ELECT CAUTERY BLADE TIP 2.5 (TIP) ×2
ELECT REM PT RETURN 9FT ADLT (ELECTROSURGICAL) ×2
ELECTRODE CAUTERY BLDE TIP 2.5 (TIP) ×1 IMPLANT
ELECTRODE REM PT RTRN 9FT ADLT (ELECTROSURGICAL) ×1 IMPLANT
GAUZE 4X4 16PLY ~~LOC~~+RFID DBL (SPONGE) ×2 IMPLANT
GLOVE SURG ORTHO LTX SZ7.5 (GLOVE) ×2 IMPLANT
GOWN STRL REUS W/ TWL LRG LVL3 (GOWN DISPOSABLE) ×2 IMPLANT
GOWN STRL REUS W/TWL LRG LVL3 (GOWN DISPOSABLE) ×4
KIT MARKER MARGIN INK (KITS) ×2 IMPLANT
KIT TURNOVER KIT A (KITS) ×2 IMPLANT
MANIFOLD NEPTUNE II (INSTRUMENTS) ×2 IMPLANT
NEEDLE HYPO 22GX1.5 SAFETY (NEEDLE) ×2 IMPLANT
PACK BASIN MINOR ARMC (MISCELLANEOUS) ×2 IMPLANT
SET LOCALIZER 20 PROBE US (MISCELLANEOUS) ×2 IMPLANT
SET WALTER ACTIVATION W/DRAPE (SET/KITS/TRAYS/PACK) IMPLANT
SLEVE PROBE SENORX GAMMA FIND (MISCELLANEOUS) ×2 IMPLANT
SUT MNCRL 4-0 (SUTURE) ×2
SUT MNCRL 4-0 27XMFL (SUTURE) ×1
SUT VIC AB 3-0 SH 27 (SUTURE) ×2
SUT VIC AB 3-0 SH 27X BRD (SUTURE) ×1 IMPLANT
SUTURE MNCRL 4-0 27XMF (SUTURE) ×1 IMPLANT
SYR 10ML LL (SYRINGE) ×2 IMPLANT
SYR 20ML LL LF (SYRINGE) ×2 IMPLANT
TRAP NEPTUNE SPECIMEN COLLECT (MISCELLANEOUS) ×2 IMPLANT
WATER STERILE IRR 1000ML POUR (IV SOLUTION) ×4 IMPLANT

## 2020-10-12 NOTE — Interval H&P Note (Signed)
History and Physical Interval Note:  10/12/2020 9:24 AM  Tracie Reed  has presented today for surgery, with the diagnosis of DCIS right breast.  The various methods of treatment have been discussed with the patient and family. After consideration of risks, benefits and other options for treatment, the patient has consented to  Procedure(s): BREAST LUMPECTOMY WITH RADIOFREQUENCY TAG IDENTIFICATION (Right) as a surgical intervention.  The patient's history has been reviewed, patient examined, no change in status, stable for surgery.  I have reviewed the patient's chart and labs.  Questions were answered to the patient's satisfaction.    The right side is marked as correct side.  Ronny Bacon

## 2020-10-12 NOTE — Anesthesia Procedure Notes (Addendum)
Date/Time: 10/12/2020 7:39 PM Performed by: Leander Rams, CRNA Pre-anesthesia Checklist: Patient identified, Emergency Drugs available, Suction available and Patient being monitored Patient Re-evaluated:Patient Re-evaluated prior to induction Oxygen Delivery Method: Circle system utilized Preoxygenation: Pre-oxygenation with 100% oxygen Induction Type: IV induction Ventilation: Mask ventilation without difficulty LMA: LMA inserted LMA Size: 4.0 Tube type: Oral Number of attempts: 1 Tube secured with: Tape Dental Injury: Teeth and Oropharynx as per pre-operative assessment

## 2020-10-12 NOTE — Anesthesia Preprocedure Evaluation (Signed)
Anesthesia Evaluation  Patient identified by MRN, date of birth, ID band Patient awake    Reviewed: Allergy & Precautions, H&P , NPO status , Patient's Chart, lab work & pertinent test results, reviewed documented beta blocker date and time   Airway Mallampati: II  TM Distance: >3 FB Neck ROM: full    Dental  (+) Teeth Intact   Pulmonary sleep apnea , former smoker,    Pulmonary exam normal        Cardiovascular Exercise Tolerance: Good negative cardio ROS Normal cardiovascular exam Rate:Normal     Neuro/Psych Anxiety negative neurological ROS  negative psych ROS   GI/Hepatic negative GI ROS, Neg liver ROS,   Endo/Other  negative endocrine ROS  Renal/GU negative Renal ROS  negative genitourinary   Musculoskeletal   Abdominal   Peds  Hematology negative hematology ROS (+)   Anesthesia Other Findings   Reproductive/Obstetrics negative OB ROS                             Anesthesia Physical Anesthesia Plan  ASA: 3  Anesthesia Plan: General LMA   Post-op Pain Management:    Induction:   PONV Risk Score and Plan: 4 or greater  Airway Management Planned:   Additional Equipment:   Intra-op Plan:   Post-operative Plan:   Informed Consent: I have reviewed the patients History and Physical, chart, labs and discussed the procedure including the risks, benefits and alternatives for the proposed anesthesia with the patient or authorized representative who has indicated his/her understanding and acceptance.       Plan Discussed with: CRNA  Anesthesia Plan Comments:         Anesthesia Quick Evaluation

## 2020-10-12 NOTE — Op Note (Signed)
  Pre-operative Diagnosis: Breast Cancer, DCIS right breast.    Post-operative Diagnosis: Same  Surgeon: Ronny Bacon, M.D., FACS  Anesthesia: General LMA  Procedure: Right lumpectomy, RFID tag directed.  Procedure Details  The patient was seen again in the Holding Room. The benefits, complications, treatment options, and expected outcomes were discussed with the patient. The risks of bleeding, infection, recurrence of symptoms, failure to resolve symptoms, hematoma, seroma, open wound, cosmetic deformity, and the need for further surgery were discussed.  The patient was taken to Operating Room, identified as Tracie Reed and the procedure verified.  A Time Out was held and the above information confirmed.  Prior to the induction of general anesthesia, antibiotic prophylaxis was administered. VTE prophylaxis was in place. The patient was positioned in the supine position. Appropriate anesthesia was then administered and tolerated well. The LOCALizer is used to mark the skin for incision.  The right breast was taped for preventing excessive mobilization of the breast tissue. The chest was prepped with Chloraprep and draped in the sterile fashion.   Attention was turned to the RFID tag localization site where an incision was made.  Due to the proximity to the skin I chose to excise an ellipse of skin immediately overlying the marker.  Dissection using the LOCALizer to perform a lumpectomy with adequate margins was performed. This was done with sharp dissection with Mayo scissors. There was minimal bleeding, hemostasis obtained with electrocautery, and the cavity packed.  The specimen was taken to the back table and painted to demarcate the 6 surfaces of potential margin.  Faxitron confirmed presence of the markers, I felt perhaps the medial margin might be vulnerable.  I returned to the cavity, sharply excised an additional margin of from the medial aspect, and hemostasis was confirmed with  electrocautery.   Once assuring that hemostasis was adequate and checked multiple times the wound was closed with interrupted 3-0 Vicryl followed by 4-0 subcuticular Monocryl sutures.  Dermabond is utilized to seal the incision.  A depot of quarter percent Marcaine with epinephrine is infiltrated into the cavity, a total of 20 mL is utilized.   Findings: Faxitron imaging: Both markers present within the specimen, scattered calcifications noted.  Estimated Blood Loss: Minimal         Drains: None         Specimens: Right upper outer quadrant lumpectomy with additional medial margin.  Sent in separate containers.       Complications: None         Condition: Stable   Ronny Bacon, M.D., [facility]  10/12/2020 ; 10:46 AM

## 2020-10-12 NOTE — Transfer of Care (Signed)
Immediate Anesthesia Transfer of Care Note  Patient: Tracie Reed  Procedure(s) Performed: BREAST LUMPECTOMY WITH RADIOFREQUENCY TAG IDENTIFICATION (Right: Breast)  Patient Location: PACU  Anesthesia Type:General  Level of Consciousness: awake  Airway & Oxygen Therapy: Patient Spontanous Breathing  Post-op Assessment: Report given to RN  Post vital signs: stable  Last Vitals:  Vitals Value Taken Time  BP 127/59 10/12/20 1045  Temp 36.6 C 10/12/20 1043  Pulse 88 10/12/20 1048  Resp 22 10/12/20 1048  SpO2 100 % 10/12/20 1048  Vitals shown include unvalidated device data.  Last Pain:  Vitals:   10/12/20 1043  TempSrc:   PainSc: 0-No pain         Complications: No notable events documented.

## 2020-10-12 NOTE — Discharge Instructions (Signed)
AMBULATORY SURGERY  ?DISCHARGE INSTRUCTIONS ? ? ?The drugs that you were given will stay in your system until tomorrow so for the next 24 hours you should not: ? ?Drive an automobile ?Make any legal decisions ?Drink any alcoholic beverage ? ? ?You may resume regular meals tomorrow.  Today it is better to start with liquids and gradually work up to solid foods. ? ?You may eat anything you prefer, but it is better to start with liquids, then soup and crackers, and gradually work up to solid foods. ? ? ?Please notify your doctor immediately if you have any unusual bleeding, trouble breathing, redness and pain at the surgery site, drainage, fever, or pain not relieved by medication. ? ? ? ?Additional Instructions: ? ? ? ?Please contact your physician with any problems or Same Day Surgery at [phone], Monday through Friday 6 am to 4 pm, or [facility] number at [phone].  ?

## 2020-10-13 ENCOUNTER — Encounter: Payer: Self-pay | Admitting: Surgery

## 2020-10-15 NOTE — Anesthesia Postprocedure Evaluation (Signed)
Anesthesia Post Note  Patient: Tracie Reed  Procedure(s) Performed: BREAST LUMPECTOMY WITH RADIOFREQUENCY TAG IDENTIFICATION (Right: Breast)  Patient location during evaluation: PACU Anesthesia Type: General Level of consciousness: awake and alert Pain management: pain level controlled Vital Signs Assessment: post-procedure vital signs reviewed and stable Respiratory status: spontaneous breathing, nonlabored ventilation, respiratory function stable and patient connected to nasal cannula oxygen Cardiovascular status: blood pressure returned to baseline and stable Postop Assessment: no apparent nausea or vomiting Anesthetic complications: no   No notable events documented.   Last Vitals:  Vitals:   10/12/20 1113 10/12/20 1129  BP: 132/71 125/73  Pulse: 76 81  Resp: 12 20  Temp: 36.4 C (!) 36.1 C  SpO2: 100% 98%    Last Pain:  Vitals:   10/13/20 1413  TempSrc:   PainSc: 0-No pain                 Molli Barrows

## 2020-10-16 ENCOUNTER — Other Ambulatory Visit: Payer: Self-pay | Admitting: Anatomic Pathology & Clinical Pathology

## 2020-10-16 LAB — SURGICAL PATHOLOGY

## 2020-10-16 NOTE — Progress Notes (Unsigned)
T

## 2020-10-23 ENCOUNTER — Encounter: Payer: Medicare Other | Admitting: Surgery

## 2020-10-23 ENCOUNTER — Ambulatory Visit: Payer: Medicare Other | Admitting: Oncology

## 2020-10-23 ENCOUNTER — Telehealth: Payer: Self-pay | Admitting: *Deleted

## 2020-10-23 ENCOUNTER — Encounter: Payer: Self-pay | Admitting: Surgery

## 2020-10-23 ENCOUNTER — Encounter: Payer: Self-pay | Admitting: Oncology

## 2020-10-23 NOTE — Telephone Encounter (Signed)
Patient called reporting that she was exposed to [place] Thursday, she tested herself this morning and is negative. She feels fine. Her surgeon made her reschedule her post op follow up appointment so she wants to reschedule her follow up appointment with Dr Janese Banks and come to [facility] office. I transferred her call to scheduler to change appointment

## 2020-10-23 NOTE — Telephone Encounter (Signed)
Schedule video visit instead after 10/7

## 2020-10-23 NOTE — Telephone Encounter (Signed)
We can do a virtual visit

## 2020-10-23 NOTE — Telephone Encounter (Signed)
Patient sent message to rodenberg and Janese Banks. Dr. Christian Mate said he would call the pt. I sent message to pt letting her know that surgeon said he would call and we will keep her appt Rao 10/14. If she needs anything she can let us know

## 2020-10-24 ENCOUNTER — Ambulatory Visit: Payer: Medicare Other | Admitting: Oncology

## 2020-10-26 ENCOUNTER — Other Ambulatory Visit: Payer: Self-pay | Admitting: Internal Medicine

## 2020-10-26 NOTE — Telephone Encounter (Signed)
Requested medication (s) are due for refill today: Yes  Requested medication (s) are on the active medication list: Yes  Last refill:  08/02/20  Future visit scheduled: No  Notes to clinic:  Protocol indicates pt. Needs lab work.    Requested Prescriptions  Pending Prescriptions Disp Refills   simvastatin (ZOCOR) 10 MG tablet [Pharmacy Med Name: SIMVASTATIN 10MG  TABLETS] 30 tablet 2    Sig: TAKE 1 TABLET(10 MG) BY MOUTH AT BEDTIME     Cardiovascular:  Antilipid - Statins Failed - 10/26/2020 11:38 AM      Failed - Total Cholesterol in normal range and within 360 days    Cholesterol  Date Value Ref Range Status  07/19/2020 230 (H) <200 mg/dL Final          Failed - LDL in normal range and within 360 days    LDL Cholesterol (Calc)  Date Value Ref Range Status  07/19/2020 151 (H) mg/dL (calc) Final    Comment:    Reference range: <100 . Desirable range <100 mg/dL for primary prevention;   <70 mg/dL for patients with CHD or diabetic patients  with > or = 2 CHD risk factors. Marland Kitchen LDL-C is now calculated using the Martin-Hopkins  calculation, which is a validated novel method providing  better accuracy than the Friedewald equation in the  estimation of LDL-C.  Cresenciano Genre et al. Annamaria Helling. 9201;007(12): 2061-2068  (http://education.[employer].com/faq/FAQ164)           Passed - HDL in normal range and within 360 days    HDL  Date Value Ref Range Status  07/19/2020 59 > OR = 50 mg/dL Final          Passed - Triglycerides in normal range and within 360 days    Triglycerides  Date Value Ref Range Status  07/19/2020 91 <150 mg/dL Final          Passed - Patient is not pregnant      Passed - Valid encounter within last 12 months    Recent Outpatient Visits           2 months ago Encounter for completion of form with patient   [facility], Coralie Keens, NP   3 months ago Encounter for general adult medical examination with abnormal findings   [facility] [place], Coralie Keens, NP   7 months ago Acute viral conjunctivitis of right eye   [place], NP   10 months ago Right arm pain   [facility], Lupita Raider, FNP   10 months ago [facility], Lupita Raider, FNP       Future Appointments             In 3 months [facility], [facility]

## 2020-10-30 ENCOUNTER — Other Ambulatory Visit: Payer: Self-pay

## 2020-10-30 ENCOUNTER — Encounter: Payer: Self-pay | Admitting: Surgery

## 2020-10-30 ENCOUNTER — Ambulatory Visit (INDEPENDENT_AMBULATORY_CARE_PROVIDER_SITE_OTHER): Payer: Medicare Other | Admitting: Surgery

## 2020-10-30 VITALS — BP 119/80 | HR 96 | Temp 98.2°F | Ht 67.0 in | Wt 133.6 lb

## 2020-10-30 DIAGNOSIS — D0511 Intraductal carcinoma in situ of right breast: Secondary | ICD-10-CM

## 2020-10-30 NOTE — Progress Notes (Signed)
[facility] POST-OP OFFICE VISIT  10/30/2020  HPI: Tracie Reed is a 75 y.o. female 18 days s/p right RF ID guided lumpectomy, with excision of additional medial margin.  She presents today with no complaints whatsoever.  Follow-up was deferred due to [place].  She has had no pain whatsoever.  We reviewed her pathology report over the phone.  We are reviewing it again today.  Vital signs: BP 119/80   Pulse 96   Temp 98.2 F (36.8 C)   Ht 5\' 7"  (1.702 m)   Wt 133 lb 9.6 oz (60.6 kg)   SpO2 96%   BMI 20.92 kg/m    Physical Exam: Constitutional: She appears well. Right breast without deformity, appreciable seroma or hematoma. Skin: Incision is clean, dry and intact with Dermabond intact.   SURGICAL PATHOLOGY  CASE: [id number]  PATIENT: Tracie Reed  Surgical Pathology Report   Specimen Submitted:  A. Breast, right upper outer quadrant; excision  B. Breast, right, additional medial margin   Clinical History: DCIS right breast  DIAGNOSIS:  A. BREAST, RIGHT, UPPER OUTER QUADRANT; MAMMOGRAPHICALLY-DIRECTED  LUMPECTOMY WITH RFID TAG LOCALIZATION:  - DUCTAL CARCINOMA IN SITU (DCIS), LOW GRADE, 2 MM FOCUS WITH  CALCIFICATIONS, SEE SUMMARY BELOW.  - BIOPSY SITE CHANGES, RIBBON CLIP, AND RFID TAG PRESENT.   B. BREAST, RIGHT, ADDITIONAL MEDIAL MARGIN; EXCISION:  - ISOLATED FOCUS CONSISTENT WITH DCIS, INTERMEDIATE GRADE, SEE COMMENT.  - FOCAL ATYPICAL LOBULAR HYPERPLASIA.   Comment:  The largest DCIS measurement in the summary below is from the  stereotactic biopsy, 4 mm ([id number], slides were reviewed).   In part B, there is a single duct, 0.7 mm, with a solid proliferation of  enlarged moderately atypical cells without necrosis. An adjacent smaller  duct shows mild epithelial atypia.  The lesion is located about 1.5 mm from the closest inked margin  (specimen not oriented).  It is not associated with biopsy site changes or calcifications.  Based  on the location and degree of cytologic atypia, it represents a  separate focus from the previously biopsied DCIS.  The duct of concern disappears on deeper sections, so ER testing cannot  be performed.  No summary is provided on part B; it may be an isolated incidental  finding but might affect strategies for follow up and treatment.   CANCER CASE SUMMARY: DUCTAL CARCINOMA IN SITU OF THE BREAST  Standard(s): AJCC-UICC 8   SPECIMEN A:  Procedure: Excision  Specimen Laterality: Right   TUMOR  Histologic Type: Ductal carcinoma in situ  Size (Extent) of DCIS:  at least 4 mm  Nuclear Grade: Grade I (low grade)  Necrosis: Not identified   MARGINS  Margin Status: All margins negative for DCIS                       Distance from DCIS to closest margin: 5 mm                       Specify closest margin: Posterior   REGIONAL LYMPH NODES  Regional Lymph node Status: Not applicable (no regional lymph nodes  submitted or found   DISTANT METASTASIS  Distant Site(s) Involved, if applicable (select all that apply): Not  applicable   PATHOLOGIC STAGE CLASSIFICATION (pTNM, AJCC 8th Edition)  TNM Descriptors: Not applicable  pTis (DCIS)  Regional Lymph Nodes Modifier: Not applicable  pN not assigned (no nodes submitted or found)  pM - Not applicable   SPECIAL  STUDIES  Breast Biomarker Testing Performed on Previous Biopsy: [phone]       Estrogen Receptor (ER): Positive, greater than 90%, strong staining   GROSS DESCRIPTION:   A. Labeled: Upper outer quadrant right breast  Received: Fresh  Specimen radiograph image available for review  Radiographic findings: A ribbon clip and RFID tag are present.  Time in fixative: Collected at 10:02 AM on 10/12/2020 and placed in  formalin at 10:21 AM on 10/12/2020  Cold ischemic time: Less than 30 minutes  Total fixation time: Approximately 10 hours  Type of procedure: Breast lumpectomy  Location / laterality of specimen: Right breast, upper  outer quadrant  Orientation of specimen: The specimen is received inked.  Inking:  Anterior = green  Inferior = blue  Lateral = orange  Medial = yellow  Posterior = black  Superior = red  Size of specimen: 8.2 (medial to lateral) x 6.9 (superior to inferior) x  1.5 (anterior to posterior) cm  Skin: On the anterior aspect there is a 3.5 x 0.9 cm ellipse of tan  finely wrinkled skin.  Biopsy site: There is a presumed biopsy site which contains a ribbon  shaped clip.  The clip site is 0.2 cm to the posterior margin, 0.3 cm to  the medial margin, 1.3 cm to the superior margin, 0.8 cm to the anterior  margin, 2.5 cm to the inferior margin, and 3.2 cm to the lateral margin.  Additionally the clip site is 2.2 cm to the skin ellipse.   Number of discrete masses: None grossly identified.  Description of tissue: Sectioning reveals yellow lobulated adipose  tissue admixed with tan-white firm fibrous tissue.  The clip site and  RFID tag are located within the fibrous tissue.  The fat to fibrous  tissue ratio is 70:30.   Block summary (the entire area of fibrous tissue is submitted.  Approximately 30% of the specimen remains.):  1 - 2 - medial margin, perpendicularly sectioned  3 - 19 - fibrous tissue, submitted entirely and sequentially from medial  to lateral       5 - clip site with closest posterior, medial, and superior margins       6 - 7 - one section bisected            6 - remaining clip site with closest posterior, medial, and  superior margins       8 - 10 - one section trisected            9 - closest anterior margins to clip site       11 - 13 - one section trisected            13 - closest inferior margin to clip site       14 - 16 - one section trisected            16 - closest skin to clip site       17 - 19 - one section trisected            18 - 19 - remaining skin  20 - representative grossly normal breast tissue adjacent to area of  firm fibrous tissue  21 - closest  lateral margins to clip site  22 - 23 - lateral margin, perpendicularly sectioned   B. Labeled: Additional medial margin right breast  Received: Fresh  Radiographic findings: Not provided  Time in fixative: Collected at 10:15 AM on 10/12/2020 and placed in  formalin at  10:28 AM on 10/12/2020  Cold ischemic time: Less than 15 minutes  Total fixation time: Approximately 9.75 hours  Type of procedure: Breast lumpectomy  Location / laterality of specimen: Right breast, medial  Orientation of specimen: Unoriented  Inking: Blue  Size of specimen: 4.2 x 1.7 x 1 cm  Skin: None grossly identified.   Biopsy site: None grossly identified.  Number of discrete masses: None grossly identified.  Description of tissue: Sectioning reveals yellow lobulated adipose  tissue admixed with tan-white firm fibrous tissue.  The fat to fibrous  tissue ratio is 30:70.   Block summary (specimen submitted entirely):  1 - resection margin A, perpendicularly sectioned  2 - 5 - central sections submitted entirely and sequentially  6 - resection margin B, perpendicularly sectioned   RB 10/12/2020    Final Diagnosis performed by Bryan Lemma, MD.   Electronically signed  10/16/2020 5:35:11PM  The electronic signature indicates that the named Attending Pathologist  has evaluated the specimen  Technical component performed at [facility], [address], [place],  [place] [address] Lab: [phone] Dir: Rush Farmer, MD, MMM   Professional component performed at [facility], [facility], [place], [place], [place] [address] Lab: [phone]  Dir: Dellia Nims. Reuel Derby, MD    Assessment/Plan: This is a 75 y.o. female 18 days s/p RF ID tag to right breast localized lumpectomy. Additional foci identified in additional excision medially showed intermediate grade DCIS. However, I believe this will likely have no bearing on this patient's additional treatment at this time due to her steadfast desire to  avoid antiestrogen therapy and radiation.  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  We will be glad to discuss options further.  She will see Dr. Janese Banks in 10 days, we had previously discussed referral to see Dr. Baruch Gouty in the past but I believe she is reluctant to pursue that at this point.   Ronny Bacon M.D., FACS 10/30/2020, 11:58 AM

## 2020-10-30 NOTE — Patient Instructions (Addendum)
Follow up here in 6 months with a right breast mammogram prior. We will send you a letter about these appointments.   Follow up with Dr Janese Banks as scheduled.

## 2020-11-09 ENCOUNTER — Inpatient Hospital Stay: Payer: Medicare Other | Attending: Oncology | Admitting: Nurse Practitioner

## 2020-11-09 ENCOUNTER — Other Ambulatory Visit: Payer: Self-pay

## 2020-11-09 VITALS — BP 103/74 | HR 82 | Temp 97.7°F | Resp 16 | Wt 132.8 lb

## 2020-11-09 DIAGNOSIS — Z87891 Personal history of nicotine dependence: Secondary | ICD-10-CM | POA: Insufficient documentation

## 2020-11-09 DIAGNOSIS — F419 Anxiety disorder, unspecified: Secondary | ICD-10-CM | POA: Diagnosis not present

## 2020-11-09 DIAGNOSIS — Z818 Family history of other mental and behavioral disorders: Secondary | ICD-10-CM | POA: Diagnosis not present

## 2020-11-09 DIAGNOSIS — Z9049 Acquired absence of other specified parts of digestive tract: Secondary | ICD-10-CM | POA: Insufficient documentation

## 2020-11-09 DIAGNOSIS — D0511 Intraductal carcinoma in situ of right breast: Secondary | ICD-10-CM | POA: Insufficient documentation

## 2020-11-09 DIAGNOSIS — M8580 Other specified disorders of bone density and structure, unspecified site: Secondary | ICD-10-CM | POA: Diagnosis not present

## 2020-11-09 DIAGNOSIS — Z79899 Other long term (current) drug therapy: Secondary | ICD-10-CM | POA: Insufficient documentation

## 2020-11-09 DIAGNOSIS — Z833 Family history of diabetes mellitus: Secondary | ICD-10-CM | POA: Diagnosis not present

## 2020-11-09 DIAGNOSIS — Z803 Family history of malignant neoplasm of breast: Secondary | ICD-10-CM | POA: Insufficient documentation

## 2020-11-09 DIAGNOSIS — E7800 Pure hypercholesterolemia, unspecified: Secondary | ICD-10-CM | POA: Diagnosis not present

## 2020-11-09 NOTE — Progress Notes (Signed)
Pt has no concerns/complaints at this time. 

## 2020-11-09 NOTE — Progress Notes (Signed)
Hematology/Oncology Consult Note [facility] [phone] [phone]  Patient Care Team: Jearld Fenton, NP as PCP - General (Internal Medicine) Rico Junker, RN as Oncology Nurse Navigator   Name of the patient: Tracie Reed  [id number]  11/15/1945   Reason for referral-DCIS   Referring physician-Regina Baity, NP  Date of visit: 11/09/20  History of presenting illness- Patient is a 75 year old female who recently underwent a screening mammogram on 08/22/2020 which showed possible calcifications in the right breast but no concerning findings in the left breast.  This was followed by diagnostic mammogram which showed pleomorphic calcifications in the upper outer right breast spanning 0.5 cm.  History of prior breast biopsy several years ago.  Menopause at the age of 56 and menarche 41.  She is G2, P2 A2.  Family history of breast cancer in her mother in her 50s.  Options for management were discussed including adjuvant radiation or hormonal therapy.  Patient was reluctant to consider either.  Treatment would be given with curative intent.  Interval History: Patient returns to clinic for further evaluation and discussion of management of DCIS.  At previous visit, recommendation for adjuvant radiation versus hormonal therapy was discussed by Dr. Janese Banks.  In the interim, she continues to be hesitant to pursue additional treatment.  She is recovered well from wide local excision of the right breast, ER positive.  No specific complaints today.  ECOG PS- 1  Pain scale- 0   Review of systems- Review of Systems  Constitutional:  Negative for chills, fever, malaise/fatigue and weight loss.  HENT:  Negative for congestion, ear discharge and nosebleeds.   Eyes:  Negative for blurred vision.  Respiratory:  Negative for cough, hemoptysis, sputum production, shortness of breath and wheezing.   Cardiovascular:  Negative for chest pain, palpitations,  orthopnea and claudication.  Gastrointestinal:  Negative for abdominal pain, blood in stool, constipation, diarrhea, heartburn, melena, nausea and vomiting.  Genitourinary:  Negative for dysuria, flank pain, frequency, hematuria and urgency.  Musculoskeletal:  Negative for back pain, joint pain and myalgias.  Skin:  Negative for rash.  Neurological:  Negative for dizziness, tingling, focal weakness, seizures, weakness and headaches.  Endo/Heme/Allergies:  Does not bruise/bleed easily.  Psychiatric/Behavioral:  Negative for depression and suicidal ideas. The patient does not have insomnia.    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     Past Medical History:  Diagnosis Date   Anxiety    DCIS (ductal carcinoma in situ) of breast    History of cataract    Pre-diabetes    last A1C in 2021 was 5.2   Sleep apnea    had uvula removed     Past Surgical History:  Procedure Laterality Date   BREAST BIOPSY Right 09/14/2020   Affirm bx-calcs, "Ribbon" marker-path pending   BREAST EXCISIONAL BIOPSY Right 1993   neg   BREAST LUMPECTOMY WITH RADIOFREQUENCY TAG IDENTIFICATION Right 10/12/2020   Procedure: BREAST LUMPECTOMY WITH RADIOFREQUENCY TAG IDENTIFICATION;  Surgeon: Ronny Bacon, MD;  Location: [facility] ORS;  Service: General;  Laterality: Right;   BUNIONECTOMY Bilateral    CATARACT EXTRACTION  09/03/2020   CHOLECYSTECTOMY     COLONOSCOPY WITH PROPOFOL N/A 07/31/2020   Procedure: COLONOSCOPY WITH PROPOFOL;  Surgeon: Lucilla Lame, MD;  Location: [facility] ENDOSCOPY;  Service: Endoscopy;  Laterality: N/A;   IRIDOTOMY / IRIDECTOMY Right    UVULECTOMY      Social History   Socioeconomic History   Marital status: Significant Other    Spouse name:  Not on file   Number of children: 2   Years of education: Not on file   Highest education level: Bachelor's degree (e.g., BA, AB, BS)  Occupational History   Occupation: self employed    Comment: Holiday representative  Tobacco Use   Smoking status: Former    Packs/day: 0.50    Years: 5.00    Pack years: 2.50    Types: Cigarettes    Quit date: 11/12/1987    Years since quitting: 33.0   Smokeless tobacco: Never  Vaping Use   Vaping Use: Never used  Substance and Sexual Activity   Alcohol use: Yes    Comment: occ beer once a week   Drug use: Not Currently    Comment: past marijuana - very remote use   Sexual activity: Yes  Other Topics Concern   Not on file  Social History Narrative   Not on file   Social Determinants of Health   Financial Resource Strain: Low Risk    Difficulty of Paying Living Expenses: Not hard at all  Food Insecurity: No Food Insecurity   Worried About Charity fundraiser in the Last Year: Never true   [place] in the Last Year: Never true  Transportation Needs: No Transportation Needs   Lack of Transportation (Medical): No   Lack of Transportation (Non-Medical): No  Physical Activity: Sufficiently Active   Days of Exercise per Week: 7 days   Minutes of Exercise per Session: 30 min  Stress: No Stress Concern Present   Feeling of Stress : Not at all  Social Connections: Not on file  Intimate Partner Violence: Not on file     Family History  Problem Relation Age of Onset   Breast cancer Mother    Diabetes Mother    Depression Mother    Healthy Brother      Current Outpatient Medications:    acyclovir ointment (ZOVIRAX) 5 %, Apply 1 application topically every 3 (three) hours as needed. (Patient taking differently: Apply 1 application topically every 3 (three) hours as needed (cold sores).), Disp: 15 g, Rfl: 1   CALCIUM-VITAMIN D PO, Take 2 tablets by mouth daily., Disp: , Rfl:    cetirizine (ZYRTEC) 10 MG tablet, Take  1 tablet (10 mg total) by mouth daily. (Patient taking differently: Take 10 mg by mouth at bedtime.), Disp: 90 tablet, Rfl: 3   diclofenac Sodium (VOLTAREN) 1 % GEL, Apply 1 application topically daily., Disp: , Rfl:    FLUoxetine (PROZAC) 10 MG tablet, TAKE 1 TABLET(10 MG) BY MOUTH DAILY (Patient taking differently: Take 10 mg by mouth every morning.), Disp: 90 tablet, Rfl: 0   ibuprofen (ADVIL) 800 MG tablet, Take 1 tablet (800 mg total) by mouth every 8 (eight) hours as needed., Disp: 30 tablet, Rfl: 0   simvastatin (ZOCOR) 10 MG tablet, TAKE 1 TABLET(10 MG) BY MOUTH AT BEDTIME, Disp: 30 tablet, Rfl: 2   traZODone (DESYREL) 50 MG tablet, Take 1-2 tablets (50-100 mg total) by mouth at bedtime as needed for sleep., Disp: 180 tablet, Rfl: 0   valACYclovir (VALTREX) 1000 MG tablet, Take 2 tablets (2,000 mg) at the start of your cold sore.  Take 1 tablet (1,000 mg) daily until  sores have resolved. (Patient taking differently: Take 1,000-2,000 mg by mouth See admin instructions. Take 2 tablets (2,000 mg) at the start of your cold sore.  Take 1 tablet (1,000 mg) daily until sores have resolved.PRN), Disp: 15 tablet, Rfl: 1   Carboxymethylcellulose Sodium 1 % GEL, Place 1 drop into both eyes at bedtime. (Patient not taking: Reported on 11/09/2020), Disp: , Rfl:    latanoprost (XALATAN) 0.005 % ophthalmic solution, Place 1 drop into both eyes at bedtime. (Patient not taking: Reported on 11/09/2020), Disp: , Rfl:    Physical exam:  Vitals:   11/09/20 1120  BP: 103/74  Pulse: 82  Resp: 16  Temp: 97.7 F (36.5 C)  TempSrc: Oral  SpO2: 100%  Weight: 132 lb 12.8 oz (60.2 kg)   Physical Exam Constitutional:      General: She is not in acute distress. Cardiovascular:     Rate and Rhythm: Normal rate and regular rhythm.     Heart sounds: Normal heart sounds.  Pulmonary:     Effort: Pulmonary effort is normal.     Breath sounds: Normal breath sounds.  Abdominal:     General: Bowel sounds are  normal.     Palpations: Abdomen is soft.  Skin:    General: Skin is warm and dry.  Neurological:     Mental Status: She is alert and oriented to person, place, and time.   Breast exam: Declined  CMP Latest Ref Rng & Units 10/09/2020 07/19/2020 07/05/2019  Glucose 70 - 99 mg/dL 104(H) 80 122(H)  BUN 8 - 23 mg/dL 8 10 8   Creatinine 0.44 - 1.00 mg/dL 0.52 0.67 0.67  Sodium 135 - 145 mmol/L 139 139 140  Potassium 3.5 - 5.1 mmol/L 4.1 4.5 4.3  Chloride 98 - 111 mmol/L 103 103 103  CO2 22 - 32 mmol/L 28 29 27   Calcium 8.9 - 10.3 mg/dL 9.1 9.5 9.9  Total Protein 6.5 - 8.1 g/dL 6.3(L) 6.6 6.5  Total Bilirubin 0.3 - 1.2 mg/dL 0.7 0.4 0.5  Alkaline Phos 38 - 126 U/L 59 - -  AST 15 - 41 U/L 22 15 18   ALT 0 - 44 U/L 18 13 15    CBC EXTENDED Latest Ref Rng & Units 10/09/2020 07/19/2020 07/05/2019  WBC 4.0 - 10.5 K/uL 6.5 7.7 7.0  RBC 3.87 - 5.11 MIL/uL 4.46 4.64 4.74  HGB 12.0 - 15.0 g/dL 13.3 13.6 15.1  HCT 36.0 - 46.0 % 40.1 42.3 44.5  PLT 150 - 400 K/uL 209 239 252  NEUTROABS 1.7 - 7.7 K/uL 3.9 - 4,760  LYMPHSABS 0.7 - 4.0 K/uL 2.0 - 1,477   No images are attached to the encounter.  MM Breast Surgical Specimen  Result Date: 10/12/2020 CLINICAL DATA:  Status post radiofrequency tag localized right breast lumpectomy. EXAM: SPECIMEN RADIOGRAPH OF THE right BREAST COMPARISON:  Previous exam(s). FINDINGS: Status post excision of the right breast. The radiofrequency reflector and ribbon shaped clip are present within the specimen. Few scattered calcifications are seen within the specimen. IMPRESSION: Specimen radiograph of the right breast. Electronically Signed   By: Fidela Salisbury M.D.   On: 10/12/2020 13:16   Assessment and plan- Patient is a 75 y.o. 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  Stereotactic core biopsy was positive for DCIS, ER positive.  She  underwent a wide local excision.  Pathology showed 2 mm focus of low-grade ductal carcinoma.  Separate isolated focus of intermediate grade DCIS and focal atypical lobular hyperplasia.  Largest measurement was 4 mm in greatest dimension.  She recovered well postoperatively.  Lengthy discussion today about risks and benefits and alternatives between adjuvant radiation, hormonal therapy, versus observation.  She is opted to meet with Dr. Baruch Gouty for consideration of adjuvant radiation.  She is hesitant to consider antiestrogen therapy but agrees to return to medical oncology for follow-up after she completes radiation to discuss.  She will continue to need mammograms yearly per surveillance guidelines.  Thank you for this kind referral and the opportunity to participate in the care of this patient  Visit Diagnosis 1. Ductal carcinoma in situ (DCIS) of right breast    Beckey Rutter, DNP, AGNP-C [place] at [facility] [phone] (clinic)

## 2020-11-14 ENCOUNTER — Ambulatory Visit: Payer: Medicare Other | Admitting: Radiation Oncology

## 2020-11-15 ENCOUNTER — Other Ambulatory Visit: Payer: Self-pay | Admitting: Internal Medicine

## 2020-11-15 DIAGNOSIS — B001 Herpesviral vesicular dermatitis: Secondary | ICD-10-CM

## 2020-11-15 NOTE — Telephone Encounter (Signed)
Pt called in to request to have Rx for valACYclovir (VALTREX) 1000 MG tablet sent to a new pharmacy. Pt says that she didn't request via pharmacy because it is a new location. Pt's last ov with PCP was on 09/09/20.  No future visits scheduled.     Pharmacy: [id number] - [place], [place] - [address] [address]  Please assist further.

## 2020-11-16 MED ORDER — VALACYCLOVIR HCL 1 G PO TABS: ORAL_TABLET | ORAL | 1 refills | Status: DC

## 2020-11-16 NOTE — Telephone Encounter (Signed)
Requested Prescriptions  Pending Prescriptions Disp Refills  . valACYclovir (VALTREX) 1000 MG tablet 15 tablet 1    Sig: Take 2 tablets (2,000 mg) at the start of your cold sore.  Take 1 tablet (1,000 mg) daily until sores have resolved.     Antimicrobials:  Antiviral Agents - Anti-Herpetic Passed - 11/15/2020  6:45 PM      Passed - Valid encounter within last 12 months    Recent Outpatient Visits          3 months ago Encounter for completion of form with patient   [facility], Coralie Keens, NP   4 months ago Encounter for general adult medical examination with abnormal findings   [facility] [place], Coralie Keens, NP   8 months ago Acute viral conjunctivitis of right eye   [place], NP   10 months ago Right arm pain   [facility], Lupita Raider, [place]   11 months ago [facility] [place], Lupita Raider, FNP      Future Appointments            In 2 months [facility], [facility]

## 2020-11-26 ENCOUNTER — Other Ambulatory Visit: Payer: Self-pay

## 2020-11-26 ENCOUNTER — Ambulatory Visit
Admission: RE | Admit: 2020-11-26 | Discharge: 2020-11-26 | Disposition: A | Discharge status: Home / Self Care | Payer: Medicare Other | Source: Ambulatory Visit | Attending: Radiation Oncology | Admitting: Radiation Oncology

## 2020-11-26 VITALS — BP 109/76 | HR 73 | Temp 97.7°F | Resp 16 | Wt 132.2 lb

## 2020-11-26 DIAGNOSIS — D0511 Intraductal carcinoma in situ of right breast: Secondary | ICD-10-CM | POA: Diagnosis not present

## 2020-11-26 DIAGNOSIS — G4730 Sleep apnea, unspecified: Secondary | ICD-10-CM | POA: Diagnosis not present

## 2020-11-26 DIAGNOSIS — Z803 Family history of malignant neoplasm of breast: Secondary | ICD-10-CM | POA: Diagnosis not present

## 2020-11-26 DIAGNOSIS — Z87891 Personal history of nicotine dependence: Secondary | ICD-10-CM | POA: Insufficient documentation

## 2020-11-26 DIAGNOSIS — Z79899 Other long term (current) drug therapy: Secondary | ICD-10-CM | POA: Insufficient documentation

## 2020-11-26 DIAGNOSIS — Z170 Estrogen receptor positive status [ER+]: Secondary | ICD-10-CM | POA: Diagnosis not present

## 2020-11-26 NOTE — Consult Note (Signed)
NEW PATIENT EVALUATION  Name: Tracie Reed  MRN: [id number]  Date:   11/26/2020     DOB: 03/22/1945   This 75 y.o. female patient presents to the clinic for initial evaluation of stage 0 (Tis N0 M0) ductal carcinoma in situ of the right breast status post wide local excision ER positive.  REFERRING PHYSICIAN: Jearld Fenton, NP  CHIEF COMPLAINT:  Chief Complaint  Patient presents with   New Patient (Initial Visit)    breast    DIAGNOSIS: The encounter diagnosis was Breast neoplasm, Tis (DCIS), right.   PREVIOUS INVESTIGATIONS:  Mammogram and ultrasound reviewed Clinical notes reviewed Pathology report reviewed  HPI: Patient is a 75 year old female who presented with an abnormal mammogram of her right breast showing determinate calcifications spanning 5 mm in the upper outer quadrant.  Stereotactic core biopsy was positive for ductal carcinoma in situ ER positive.  She went out of a wide local excision for the 2 mm focus of low-grade ductal carcinoma.  There is also a separate isolated focus of intermediate grade DCIS and focal atypical lobular hyperplasia.  Largest measurement by stereotactic biopsy was 4 mm in greatest dimension.  She did well postoperatively.  Is now referred to radiation collagen for opinion she specifically denies breast tenderness cough or bone pain.  PLANNED TREATMENT REGIMEN: Hypofractionated whole breast radiation  PAST MEDICAL HISTORY:  has a past medical history of Anxiety, DCIS (ductal carcinoma in situ) of breast, History of cataract, Pre-diabetes, and Sleep apnea.    PAST SURGICAL HISTORY:  Past Surgical History:  Procedure Laterality Date   BREAST BIOPSY Right 09/14/2020   Affirm bx-calcs, "Ribbon" marker-path pending   BREAST EXCISIONAL BIOPSY Right 1993   neg   BREAST LUMPECTOMY WITH RADIOFREQUENCY TAG IDENTIFICATION Right 10/12/2020   Procedure: BREAST LUMPECTOMY WITH RADIOFREQUENCY TAG IDENTIFICATION;  Surgeon: Ronny Bacon, MD;   Location: [facility] ORS;  Service: General;  Laterality: Right;   BUNIONECTOMY Bilateral    CATARACT EXTRACTION  09/03/2020   CHOLECYSTECTOMY     COLONOSCOPY WITH PROPOFOL N/A 07/31/2020   Procedure: COLONOSCOPY WITH PROPOFOL;  Surgeon: Lucilla Lame, MD;  Location: [facility] ENDOSCOPY;  Service: Endoscopy;  Laterality: N/A;   IRIDOTOMY / IRIDECTOMY Right    UVULECTOMY      FAMILY HISTORY: family history includes Breast cancer in her mother; Depression in her mother; Diabetes in her mother; Healthy in her brother.  SOCIAL HISTORY:  reports that she quit smoking about 33 years ago. Her smoking use included cigarettes. She has a 2.50 pack-year smoking history. She has never used smokeless tobacco. She reports current alcohol use. She reports that she does not currently use drugs.  ALLERGIES: Patient has no known allergies.  MEDICATIONS:  Current Outpatient Medications  Medication Sig Dispense Refill   acyclovir ointment (ZOVIRAX) 5 % Apply 1 application topically every 3 (three) hours as needed. (Patient taking differently: Apply 1 application topically every 3 (three) hours as needed (cold sores).) 15 g 1   CALCIUM-VITAMIN D PO Take 2 tablets by mouth daily.     cetirizine (ZYRTEC) 10 MG tablet Take 1 tablet (10 mg total) by mouth daily. (Patient taking differently: Take 10 mg by mouth at bedtime.) 90 tablet 3   diclofenac Sodium (VOLTAREN) 1 % GEL Apply 1 application topically daily.     FLUoxetine (PROZAC) 10 MG tablet TAKE 1 TABLET(10 MG) BY MOUTH DAILY (Patient taking differently: Take 10 mg by mouth every morning.) 90 tablet 0   ibuprofen (ADVIL) 800 MG tablet Take 1  tablet (800 mg total) by mouth every 8 (eight) hours as needed. 30 tablet 0   simvastatin (ZOCOR) 10 MG tablet TAKE 1 TABLET(10 MG) BY MOUTH AT BEDTIME 30 tablet 2   traZODone (DESYREL) 50 MG tablet Take 1-2 tablets (50-100 mg total) by mouth at bedtime as needed for sleep. 180 tablet 0   valACYclovir (VALTREX) 1000 MG tablet Take 2  tablets (2,000 mg) at the start of your cold sore.  Take 1 tablet (1,000 mg) daily until sores have resolved. 15 tablet 1   Carboxymethylcellulose Sodium 1 % GEL Place 1 drop into both eyes at bedtime. (Patient not taking: No sig reported)     latanoprost (XALATAN) 0.005 % ophthalmic solution Place 1 drop into both eyes at bedtime. (Patient not taking: No sig reported)     No current facility-administered medications for this encounter.    ECOG PERFORMANCE STATUS:  0 - Asymptomatic  REVIEW OF SYSTEMS: Patient denies any weight loss, fatigue, weakness, fever, chills or night sweats. Patient denies any loss of vision, blurred vision. Patient denies any ringing  of the ears or hearing loss. No irregular heartbeat. Patient denies heart murmur or history of fainting. Patient denies any chest pain or pain radiating to her upper extremities. Patient denies any shortness of breath, difficulty breathing at night, cough or hemoptysis. Patient denies any swelling in the lower legs. Patient denies any nausea vomiting, vomiting of blood, or coffee ground material in the vomitus. Patient denies any stomach pain. Patient states has had normal bowel movements no significant constipation or diarrhea. Patient denies any dysuria, hematuria or significant nocturia. Patient denies any problems walking, swelling in the joints or loss of balance. Patient denies any skin changes, loss of hair or loss of weight. Patient denies any excessive worrying or anxiety or significant depression. Patient denies any problems with insomnia. Patient denies excessive thirst, polyuria, polydipsia. Patient denies any swollen glands, patient denies easy bruising or easy bleeding. Patient denies any recent infections, allergies or URI. Patient "s visual fields have not changed significantly in recent time.   PHYSICAL EXAM: BP 109/76 (BP Location: Right Arm, Patient Position: Sitting, Cuff Size: Normal)   Pulse 73   Temp 97.7 F (36.5 C)  (Tympanic)   Resp 16   Wt 132 lb 3.2 oz (60 kg)   SpO2 99%   BMI 20.71 kg/m  She is status post wide local excision of the right breast incision is healing well no dominant masses noted in either breast.  No axillary or supraclavicular adenopathy is appreciated.  Well-developed well-nourished patient in NAD. HEENT reveals PERLA, EOMI, discs not visualized.  Oral cavity is clear. No oral mucosal lesions are identified. Neck is clear without evidence of cervical or supraclavicular adenopathy. Lungs are clear to A&P. Cardiac examination is essentially unremarkable with regular rate and rhythm without murmur rub or thrill. Abdomen is benign with no organomegaly or masses noted. Motor sensory and DTR levels are equal and symmetric in the upper and lower extremities. Cranial nerves II through XII are grossly intact. Proprioception is intact. No peripheral adenopathy or edema is identified. No motor or sensory levels are noted. Crude visual fields are within normal range.  LABORATORY DATA: Pathology report reviewed    RADIOLOGY RESULTS: Mammograms reviewed compatible with above-stated findings   IMPRESSION: ER positive ductal carcinoma in situ of the right breast status post wide local excision in 74 year old female  PLAN: This time of offered a course of hypofractionated whole breast radiation over 3 weeks.  Would also boost her scar another 1000 cGy using electron beam therapy.  Risks and benefits of treatment including skin reaction fatigue possible inclusion of superficial lung all were discussed in detail with the patient.  I personally set up and ordered CT simulation later this week.  Patient is hesitant about antiestrogen therapy although that will be discussed after completion of radiation.  I would like to take this opportunity to thank you for allowing me to participate in the care of your patient.Noreene Filbert, MD

## 2020-11-29 ENCOUNTER — Ambulatory Visit
Admission: RE | Admit: 2020-11-29 | Discharge: 2020-11-29 | Disposition: A | Discharge status: Home / Self Care | Payer: Medicare Other | Source: Ambulatory Visit | Attending: Radiation Oncology | Admitting: Radiation Oncology

## 2020-11-29 DIAGNOSIS — D0511 Intraductal carcinoma in situ of right breast: Secondary | ICD-10-CM | POA: Diagnosis not present

## 2020-11-29 DIAGNOSIS — Z510 Encounter for antineoplastic radiation therapy: Secondary | ICD-10-CM | POA: Insufficient documentation

## 2020-11-29 DIAGNOSIS — Z170 Estrogen receptor positive status [ER+]: Secondary | ICD-10-CM | POA: Diagnosis not present

## 2020-12-01 DIAGNOSIS — D0511 Intraductal carcinoma in situ of right breast: Secondary | ICD-10-CM | POA: Diagnosis not present

## 2020-12-01 DIAGNOSIS — Z510 Encounter for antineoplastic radiation therapy: Secondary | ICD-10-CM | POA: Diagnosis not present

## 2020-12-01 DIAGNOSIS — Z170 Estrogen receptor positive status [ER+]: Secondary | ICD-10-CM | POA: Diagnosis not present

## 2020-12-06 ENCOUNTER — Ambulatory Visit: Admission: RE | Admit: 2020-12-06 | Payer: Medicare Other | Source: Ambulatory Visit

## 2020-12-06 ENCOUNTER — Other Ambulatory Visit: Payer: Self-pay | Admitting: Internal Medicine

## 2020-12-06 DIAGNOSIS — F419 Anxiety disorder, unspecified: Secondary | ICD-10-CM

## 2020-12-06 DIAGNOSIS — D0511 Intraductal carcinoma in situ of right breast: Secondary | ICD-10-CM | POA: Diagnosis not present

## 2020-12-06 DIAGNOSIS — Z170 Estrogen receptor positive status [ER+]: Secondary | ICD-10-CM | POA: Diagnosis not present

## 2020-12-06 DIAGNOSIS — Z510 Encounter for antineoplastic radiation therapy: Secondary | ICD-10-CM | POA: Diagnosis not present

## 2020-12-06 NOTE — Telephone Encounter (Signed)
Requested Prescriptions  Pending Prescriptions Disp Refills  . FLUoxetine (PROZAC) 10 MG tablet [Pharmacy Med Name: FLUOXETINE 10MG  TABLETS] 90 tablet 0    Sig: TAKE 1 TABLET(10 MG) BY MOUTH DAILY     Psychiatry:  Antidepressants - SSRI Passed - 12/06/2020 11:08 AM      Passed - Valid encounter within last 6 months    Recent Outpatient Visits          3 months ago Encounter for completion of form with patient   [facility], Coralie Keens, NP   4 months ago Encounter for general adult medical examination with abnormal findings   [facility], Coralie Keens, NP   8 months ago Acute viral conjunctivitis of right eye   [place], NP   11 months ago Right arm pain   [facility], Lupita Raider, [place]   11 months ago [facility] [place], Lupita Raider, FNP      Future Appointments            In 2 months [facility], [facility]

## 2020-12-07 ENCOUNTER — Other Ambulatory Visit: Payer: Self-pay

## 2020-12-07 DIAGNOSIS — F5104 Psychophysiologic insomnia: Secondary | ICD-10-CM

## 2020-12-07 MED ORDER — TRAZODONE HCL 50 MG PO TABS: 50.0000 mg | ORAL_TABLET | Freq: Every evening | ORAL | 0 refills | Status: DC | PRN

## 2020-12-08 ENCOUNTER — Other Ambulatory Visit: Payer: Self-pay | Admitting: Internal Medicine

## 2020-12-08 DIAGNOSIS — F419 Anxiety disorder, unspecified: Secondary | ICD-10-CM

## 2020-12-08 NOTE — Telephone Encounter (Signed)
[facility] called, long hold time, unable to continue holding. Fluoxetine refilled on 12/06/20 #90, will refuse this Rx.  Requested Prescriptions  Pending Prescriptions Disp Refills   FLUoxetine (PROZAC) 10 MG tablet [Pharmacy Med Name: FLUOXETINE 10MG  TABLETS] 90 tablet 0    Sig: TAKE 1 TABLET(10 MG) BY MOUTH DAILY     Psychiatry:  Antidepressants - SSRI Passed - 12/08/2020  8:04 AM      Passed - Valid encounter within last 6 months    Recent Outpatient Visits           4 months ago Encounter for completion of form with patient   [facility], Coralie Keens, NP   4 months ago Encounter for general adult medical examination with abnormal findings   [facility] [place], Coralie Keens, NP   9 months ago Acute viral conjunctivitis of right eye   [place], NP   11 months ago Right arm pain   [facility], Lupita Raider, [place]   12 months ago [facility] [place], Lupita Raider, FNP       Future Appointments             In 2 months [facility], [facility]

## 2020-12-10 ENCOUNTER — Ambulatory Visit
Admission: RE | Admit: 2020-12-10 | Discharge: 2020-12-10 | Disposition: A | Discharge status: Home / Self Care | Payer: Medicare Other | Source: Ambulatory Visit | Attending: Radiation Oncology | Admitting: Radiation Oncology

## 2020-12-10 DIAGNOSIS — Z170 Estrogen receptor positive status [ER+]: Secondary | ICD-10-CM | POA: Diagnosis not present

## 2020-12-10 DIAGNOSIS — Z510 Encounter for antineoplastic radiation therapy: Secondary | ICD-10-CM | POA: Diagnosis not present

## 2020-12-10 DIAGNOSIS — D0511 Intraductal carcinoma in situ of right breast: Secondary | ICD-10-CM | POA: Diagnosis not present

## 2020-12-11 ENCOUNTER — Other Ambulatory Visit: Payer: Self-pay | Admitting: *Deleted

## 2020-12-11 ENCOUNTER — Ambulatory Visit
Admission: RE | Admit: 2020-12-11 | Discharge: 2020-12-11 | Disposition: A | Discharge status: Home / Self Care | Payer: Medicare Other | Source: Ambulatory Visit | Attending: Radiation Oncology | Admitting: Radiation Oncology

## 2020-12-11 DIAGNOSIS — Z510 Encounter for antineoplastic radiation therapy: Secondary | ICD-10-CM | POA: Diagnosis not present

## 2020-12-11 DIAGNOSIS — D0511 Intraductal carcinoma in situ of right breast: Secondary | ICD-10-CM

## 2020-12-11 DIAGNOSIS — Z170 Estrogen receptor positive status [ER+]: Secondary | ICD-10-CM | POA: Diagnosis not present

## 2020-12-12 ENCOUNTER — Ambulatory Visit
Admission: RE | Admit: 2020-12-12 | Discharge: 2020-12-12 | Disposition: A | Discharge status: Home / Self Care | Payer: Medicare Other | Source: Ambulatory Visit | Attending: Radiation Oncology | Admitting: Radiation Oncology

## 2020-12-12 DIAGNOSIS — Z170 Estrogen receptor positive status [ER+]: Secondary | ICD-10-CM | POA: Diagnosis not present

## 2020-12-12 DIAGNOSIS — Z510 Encounter for antineoplastic radiation therapy: Secondary | ICD-10-CM | POA: Diagnosis not present

## 2020-12-12 DIAGNOSIS — D0511 Intraductal carcinoma in situ of right breast: Secondary | ICD-10-CM | POA: Diagnosis not present

## 2020-12-13 ENCOUNTER — Ambulatory Visit
Admission: RE | Admit: 2020-12-13 | Discharge: 2020-12-13 | Disposition: A | Discharge status: Home / Self Care | Payer: Medicare Other | Source: Ambulatory Visit | Attending: Radiation Oncology | Admitting: Radiation Oncology

## 2020-12-13 DIAGNOSIS — Z510 Encounter for antineoplastic radiation therapy: Secondary | ICD-10-CM | POA: Diagnosis not present

## 2020-12-13 DIAGNOSIS — Z170 Estrogen receptor positive status [ER+]: Secondary | ICD-10-CM | POA: Diagnosis not present

## 2020-12-13 DIAGNOSIS — D0511 Intraductal carcinoma in situ of right breast: Secondary | ICD-10-CM | POA: Diagnosis not present

## 2020-12-14 ENCOUNTER — Ambulatory Visit
Admission: RE | Admit: 2020-12-14 | Discharge: 2020-12-14 | Disposition: A | Discharge status: Home / Self Care | Payer: Medicare Other | Source: Ambulatory Visit | Attending: Radiation Oncology | Admitting: Radiation Oncology

## 2020-12-14 DIAGNOSIS — Z170 Estrogen receptor positive status [ER+]: Secondary | ICD-10-CM | POA: Diagnosis not present

## 2020-12-14 DIAGNOSIS — Z510 Encounter for antineoplastic radiation therapy: Secondary | ICD-10-CM | POA: Diagnosis not present

## 2020-12-14 DIAGNOSIS — D0511 Intraductal carcinoma in situ of right breast: Secondary | ICD-10-CM | POA: Diagnosis not present

## 2020-12-17 ENCOUNTER — Ambulatory Visit
Admission: RE | Admit: 2020-12-17 | Discharge: 2020-12-17 | Disposition: A | Discharge status: Home / Self Care | Payer: Medicare Other | Source: Ambulatory Visit | Attending: Radiation Oncology | Admitting: Radiation Oncology

## 2020-12-17 DIAGNOSIS — Z170 Estrogen receptor positive status [ER+]: Secondary | ICD-10-CM | POA: Diagnosis not present

## 2020-12-17 DIAGNOSIS — Z510 Encounter for antineoplastic radiation therapy: Secondary | ICD-10-CM | POA: Diagnosis not present

## 2020-12-17 DIAGNOSIS — D0511 Intraductal carcinoma in situ of right breast: Secondary | ICD-10-CM | POA: Diagnosis not present

## 2020-12-18 ENCOUNTER — Ambulatory Visit
Admission: RE | Admit: 2020-12-18 | Discharge: 2020-12-18 | Disposition: A | Discharge status: Home / Self Care | Payer: Medicare Other | Source: Ambulatory Visit | Attending: Radiation Oncology | Admitting: Radiation Oncology

## 2020-12-18 DIAGNOSIS — Z510 Encounter for antineoplastic radiation therapy: Secondary | ICD-10-CM | POA: Diagnosis not present

## 2020-12-18 DIAGNOSIS — D0511 Intraductal carcinoma in situ of right breast: Secondary | ICD-10-CM | POA: Diagnosis not present

## 2020-12-18 DIAGNOSIS — Z170 Estrogen receptor positive status [ER+]: Secondary | ICD-10-CM | POA: Diagnosis not present

## 2020-12-19 ENCOUNTER — Ambulatory Visit
Admission: RE | Admit: 2020-12-19 | Discharge: 2020-12-19 | Disposition: A | Discharge status: Home / Self Care | Payer: Medicare Other | Source: Ambulatory Visit | Attending: Radiation Oncology | Admitting: Radiation Oncology

## 2020-12-19 DIAGNOSIS — D0511 Intraductal carcinoma in situ of right breast: Secondary | ICD-10-CM | POA: Diagnosis not present

## 2020-12-19 DIAGNOSIS — Z170 Estrogen receptor positive status [ER+]: Secondary | ICD-10-CM | POA: Diagnosis not present

## 2020-12-19 DIAGNOSIS — Z510 Encounter for antineoplastic radiation therapy: Secondary | ICD-10-CM | POA: Diagnosis not present

## 2020-12-24 ENCOUNTER — Ambulatory Visit
Admission: RE | Admit: 2020-12-24 | Discharge: 2020-12-24 | Disposition: A | Discharge status: Home / Self Care | Payer: Medicare Other | Source: Ambulatory Visit | Attending: Radiation Oncology | Admitting: Radiation Oncology

## 2020-12-24 DIAGNOSIS — Z170 Estrogen receptor positive status [ER+]: Secondary | ICD-10-CM | POA: Diagnosis not present

## 2020-12-24 DIAGNOSIS — Z510 Encounter for antineoplastic radiation therapy: Secondary | ICD-10-CM | POA: Diagnosis not present

## 2020-12-24 DIAGNOSIS — D0511 Intraductal carcinoma in situ of right breast: Secondary | ICD-10-CM | POA: Diagnosis not present

## 2020-12-25 ENCOUNTER — Ambulatory Visit: Payer: Medicare Other

## 2020-12-25 ENCOUNTER — Ambulatory Visit
Admission: RE | Admit: 2020-12-25 | Discharge: 2020-12-25 | Disposition: A | Discharge status: Home / Self Care | Payer: Medicare Other | Source: Ambulatory Visit | Attending: Radiation Oncology | Admitting: Radiation Oncology

## 2020-12-25 DIAGNOSIS — D0511 Intraductal carcinoma in situ of right breast: Secondary | ICD-10-CM | POA: Diagnosis not present

## 2020-12-25 DIAGNOSIS — Z170 Estrogen receptor positive status [ER+]: Secondary | ICD-10-CM | POA: Diagnosis not present

## 2020-12-25 DIAGNOSIS — Z510 Encounter for antineoplastic radiation therapy: Secondary | ICD-10-CM | POA: Diagnosis not present

## 2020-12-26 ENCOUNTER — Inpatient Hospital Stay: Payer: Medicare Other | Attending: Oncology

## 2020-12-26 ENCOUNTER — Ambulatory Visit
Admission: RE | Admit: 2020-12-26 | Discharge: 2020-12-26 | Disposition: A | Discharge status: Home / Self Care | Payer: Medicare Other | Source: Ambulatory Visit | Attending: Radiation Oncology | Admitting: Radiation Oncology

## 2020-12-26 ENCOUNTER — Other Ambulatory Visit: Payer: Self-pay

## 2020-12-26 DIAGNOSIS — Z170 Estrogen receptor positive status [ER+]: Secondary | ICD-10-CM | POA: Diagnosis not present

## 2020-12-26 DIAGNOSIS — D0511 Intraductal carcinoma in situ of right breast: Secondary | ICD-10-CM | POA: Diagnosis not present

## 2020-12-26 DIAGNOSIS — Z510 Encounter for antineoplastic radiation therapy: Secondary | ICD-10-CM | POA: Diagnosis not present

## 2020-12-26 LAB — CBC
HCT: 39.8 % (ref 36.0–46.0)
Hemoglobin: 13 g/dL (ref 12.0–15.0)
MCH: 30.4 pg (ref 26.0–34.0)
MCHC: 32.7 g/dL (ref 30.0–36.0)
MCV: 93 fL (ref 80.0–100.0)
Platelets: 216 10*3/uL (ref 150–400)
RBC: 4.28 MIL/uL (ref 3.87–5.11)
RDW: 14.1 % (ref 11.5–15.5)
WBC: 6.7 10*3/uL (ref 4.0–10.5)
nRBC: 0 % (ref 0.0–0.2)

## 2020-12-27 ENCOUNTER — Ambulatory Visit
Admission: RE | Admit: 2020-12-27 | Discharge: 2020-12-27 | Disposition: A | Discharge status: Home / Self Care | Payer: Medicare Other | Source: Ambulatory Visit | Attending: Radiation Oncology | Admitting: Radiation Oncology

## 2020-12-27 DIAGNOSIS — Z510 Encounter for antineoplastic radiation therapy: Secondary | ICD-10-CM | POA: Insufficient documentation

## 2020-12-27 DIAGNOSIS — D0511 Intraductal carcinoma in situ of right breast: Secondary | ICD-10-CM | POA: Diagnosis not present

## 2020-12-27 DIAGNOSIS — Z170 Estrogen receptor positive status [ER+]: Secondary | ICD-10-CM | POA: Diagnosis not present

## 2020-12-28 ENCOUNTER — Ambulatory Visit
Admission: RE | Admit: 2020-12-28 | Discharge: 2020-12-28 | Disposition: A | Discharge status: Home / Self Care | Payer: Medicare Other | Source: Ambulatory Visit | Attending: Radiation Oncology | Admitting: Radiation Oncology

## 2020-12-28 DIAGNOSIS — Z170 Estrogen receptor positive status [ER+]: Secondary | ICD-10-CM | POA: Diagnosis not present

## 2020-12-28 DIAGNOSIS — D0511 Intraductal carcinoma in situ of right breast: Secondary | ICD-10-CM | POA: Diagnosis not present

## 2020-12-28 DIAGNOSIS — Z510 Encounter for antineoplastic radiation therapy: Secondary | ICD-10-CM | POA: Diagnosis not present

## 2020-12-31 ENCOUNTER — Ambulatory Visit
Admission: RE | Admit: 2020-12-31 | Discharge: 2020-12-31 | Disposition: A | Discharge status: Home / Self Care | Payer: Medicare Other | Source: Ambulatory Visit | Attending: Radiation Oncology | Admitting: Radiation Oncology

## 2020-12-31 DIAGNOSIS — Z510 Encounter for antineoplastic radiation therapy: Secondary | ICD-10-CM | POA: Diagnosis not present

## 2020-12-31 DIAGNOSIS — D0511 Intraductal carcinoma in situ of right breast: Secondary | ICD-10-CM | POA: Diagnosis not present

## 2020-12-31 DIAGNOSIS — Z170 Estrogen receptor positive status [ER+]: Secondary | ICD-10-CM | POA: Diagnosis not present

## 2021-01-01 ENCOUNTER — Ambulatory Visit
Admission: RE | Admit: 2021-01-01 | Discharge: 2021-01-01 | Disposition: A | Discharge status: Home / Self Care | Payer: Medicare Other | Source: Ambulatory Visit | Attending: Radiation Oncology | Admitting: Radiation Oncology

## 2021-01-01 DIAGNOSIS — Z170 Estrogen receptor positive status [ER+]: Secondary | ICD-10-CM | POA: Diagnosis not present

## 2021-01-01 DIAGNOSIS — D0511 Intraductal carcinoma in situ of right breast: Secondary | ICD-10-CM | POA: Diagnosis not present

## 2021-01-01 DIAGNOSIS — Z510 Encounter for antineoplastic radiation therapy: Secondary | ICD-10-CM | POA: Diagnosis not present

## 2021-01-02 ENCOUNTER — Ambulatory Visit
Admission: RE | Admit: 2021-01-02 | Discharge: 2021-01-02 | Disposition: A | Discharge status: Home / Self Care | Payer: Medicare Other | Source: Ambulatory Visit | Attending: Radiation Oncology | Admitting: Radiation Oncology

## 2021-01-02 DIAGNOSIS — Z510 Encounter for antineoplastic radiation therapy: Secondary | ICD-10-CM | POA: Diagnosis not present

## 2021-01-02 DIAGNOSIS — D0511 Intraductal carcinoma in situ of right breast: Secondary | ICD-10-CM | POA: Diagnosis not present

## 2021-01-02 DIAGNOSIS — Z170 Estrogen receptor positive status [ER+]: Secondary | ICD-10-CM | POA: Diagnosis not present

## 2021-01-03 ENCOUNTER — Ambulatory Visit
Admission: RE | Admit: 2021-01-03 | Discharge: 2021-01-03 | Disposition: A | Discharge status: Home / Self Care | Payer: Medicare Other | Source: Ambulatory Visit | Attending: Radiation Oncology | Admitting: Radiation Oncology

## 2021-01-03 DIAGNOSIS — D0511 Intraductal carcinoma in situ of right breast: Secondary | ICD-10-CM | POA: Diagnosis not present

## 2021-01-03 DIAGNOSIS — Z510 Encounter for antineoplastic radiation therapy: Secondary | ICD-10-CM | POA: Diagnosis not present

## 2021-01-04 ENCOUNTER — Ambulatory Visit
Admission: RE | Admit: 2021-01-04 | Discharge: 2021-01-04 | Disposition: A | Discharge status: Home / Self Care | Payer: Medicare Other | Source: Ambulatory Visit | Attending: Radiation Oncology | Admitting: Radiation Oncology

## 2021-01-04 DIAGNOSIS — D0511 Intraductal carcinoma in situ of right breast: Secondary | ICD-10-CM | POA: Diagnosis not present

## 2021-01-04 DIAGNOSIS — Z510 Encounter for antineoplastic radiation therapy: Secondary | ICD-10-CM | POA: Diagnosis not present

## 2021-01-07 ENCOUNTER — Ambulatory Visit
Admission: RE | Admit: 2021-01-07 | Discharge: 2021-01-07 | Disposition: A | Discharge status: Home / Self Care | Payer: Medicare Other | Source: Ambulatory Visit | Attending: Radiation Oncology | Admitting: Radiation Oncology

## 2021-01-07 DIAGNOSIS — D0511 Intraductal carcinoma in situ of right breast: Secondary | ICD-10-CM | POA: Diagnosis not present

## 2021-01-07 DIAGNOSIS — Z510 Encounter for antineoplastic radiation therapy: Secondary | ICD-10-CM | POA: Diagnosis not present

## 2021-01-08 ENCOUNTER — Ambulatory Visit
Admission: RE | Admit: 2021-01-08 | Discharge: 2021-01-08 | Disposition: A | Discharge status: Home / Self Care | Payer: Medicare Other | Source: Ambulatory Visit | Attending: Radiation Oncology | Admitting: Radiation Oncology

## 2021-01-08 DIAGNOSIS — Z510 Encounter for antineoplastic radiation therapy: Secondary | ICD-10-CM | POA: Diagnosis not present

## 2021-01-08 DIAGNOSIS — D0511 Intraductal carcinoma in situ of right breast: Secondary | ICD-10-CM | POA: Diagnosis not present

## 2021-01-08 DIAGNOSIS — Z170 Estrogen receptor positive status [ER+]: Secondary | ICD-10-CM | POA: Diagnosis not present

## 2021-01-09 ENCOUNTER — Other Ambulatory Visit: Payer: Self-pay

## 2021-01-09 ENCOUNTER — Inpatient Hospital Stay: Payer: Medicare Other

## 2021-01-09 ENCOUNTER — Ambulatory Visit
Admission: RE | Admit: 2021-01-09 | Discharge: 2021-01-09 | Disposition: A | Discharge status: Home / Self Care | Payer: Medicare Other | Source: Ambulatory Visit | Attending: Radiation Oncology | Admitting: Radiation Oncology

## 2021-01-09 DIAGNOSIS — D0511 Intraductal carcinoma in situ of right breast: Secondary | ICD-10-CM | POA: Diagnosis not present

## 2021-01-09 DIAGNOSIS — Z510 Encounter for antineoplastic radiation therapy: Secondary | ICD-10-CM | POA: Diagnosis not present

## 2021-01-09 LAB — CBC
HCT: 38.7 % (ref 36.0–46.0)
Hemoglobin: 12.9 g/dL (ref 12.0–15.0)
MCH: 30.6 pg (ref 26.0–34.0)
MCHC: 33.3 g/dL (ref 30.0–36.0)
MCV: 91.7 fL (ref 80.0–100.0)
Platelets: 209 10*3/uL (ref 150–400)
RBC: 4.22 MIL/uL (ref 3.87–5.11)
RDW: 14.3 % (ref 11.5–15.5)
WBC: 5.3 10*3/uL (ref 4.0–10.5)
nRBC: 0 % (ref 0.0–0.2)

## 2021-01-10 ENCOUNTER — Ambulatory Visit
Admission: RE | Admit: 2021-01-10 | Discharge: 2021-01-10 | Disposition: A | Discharge status: Home / Self Care | Payer: Medicare Other | Source: Ambulatory Visit | Attending: Radiation Oncology | Admitting: Radiation Oncology

## 2021-01-10 DIAGNOSIS — D0511 Intraductal carcinoma in situ of right breast: Secondary | ICD-10-CM | POA: Diagnosis not present

## 2021-01-10 DIAGNOSIS — Z510 Encounter for antineoplastic radiation therapy: Secondary | ICD-10-CM | POA: Diagnosis not present

## 2021-01-11 ENCOUNTER — Ambulatory Visit
Admission: RE | Admit: 2021-01-11 | Discharge: 2021-01-11 | Disposition: A | Discharge status: Home / Self Care | Payer: Medicare Other | Source: Ambulatory Visit | Attending: Radiation Oncology | Admitting: Radiation Oncology

## 2021-01-11 DIAGNOSIS — D0511 Intraductal carcinoma in situ of right breast: Secondary | ICD-10-CM | POA: Diagnosis not present

## 2021-01-11 DIAGNOSIS — Z510 Encounter for antineoplastic radiation therapy: Secondary | ICD-10-CM | POA: Diagnosis not present

## 2021-01-14 ENCOUNTER — Ambulatory Visit
Admission: RE | Admit: 2021-01-14 | Discharge: 2021-01-14 | Disposition: A | Discharge status: Home / Self Care | Payer: Medicare Other | Source: Ambulatory Visit | Attending: Radiation Oncology | Admitting: Radiation Oncology

## 2021-01-14 DIAGNOSIS — Z510 Encounter for antineoplastic radiation therapy: Secondary | ICD-10-CM | POA: Diagnosis not present

## 2021-01-14 DIAGNOSIS — D0511 Intraductal carcinoma in situ of right breast: Secondary | ICD-10-CM | POA: Diagnosis not present

## 2021-01-14 DIAGNOSIS — Z170 Estrogen receptor positive status [ER+]: Secondary | ICD-10-CM | POA: Diagnosis not present

## 2021-01-15 ENCOUNTER — Other Ambulatory Visit: Payer: Self-pay | Admitting: Internal Medicine

## 2021-01-15 NOTE — Telephone Encounter (Signed)
Requested Prescriptions  Pending Prescriptions Disp Refills   simvastatin (ZOCOR) 10 MG tablet [Pharmacy Med Name: SIMVASTATIN 10MG  TABLETS] 30 tablet 5    Sig: TAKE 1 TABLET(10 MG) BY MOUTH AT BEDTIME     Cardiovascular:  Antilipid - Statins Failed - 01/15/2021 11:15 AM      Failed - Total Cholesterol in normal range and within 360 days    Cholesterol  Date Value Ref Range Status  07/19/2020 230 (H) <200 mg/dL Final         Failed - LDL in normal range and within 360 days    LDL Cholesterol (Calc)  Date Value Ref Range Status  07/19/2020 151 (H) mg/dL (calc) Final    Comment:    Reference range: <100 . Desirable range <100 mg/dL for primary prevention;   <70 mg/dL for patients with CHD or diabetic patients  with > or = 2 CHD risk factors. Marland Kitchen LDL-C is now calculated using the Martin-Hopkins  calculation, which is a validated novel method providing  better accuracy than the Friedewald equation in the  estimation of LDL-C.  Cresenciano Genre et al. Annamaria Helling. 1601;093(23): 2061-2068  (http://education.[employer].com/faq/FAQ164)          Passed - HDL in normal range and within 360 days    HDL  Date Value Ref Range Status  07/19/2020 59 > OR = 50 mg/dL Final         Passed - Triglycerides in normal range and within 360 days    Triglycerides  Date Value Ref Range Status  07/19/2020 91 <150 mg/dL Final         Passed - Patient is not pregnant      Passed - Valid encounter within last 12 months    Recent Outpatient Visits          5 months ago Encounter for completion of form with patient   [facility], Coralie Keens, NP   6 months ago Encounter for general adult medical examination with abnormal findings   [facility] [place], Coralie Keens, NP   10 months ago Acute viral conjunctivitis of right eye   [facility] Kathrine [place], NP   1 year ago Right arm pain   [facility] [place], Lupita Raider, FNP   1 year ago [place], Lupita Raider, [place]

## 2021-01-29 ENCOUNTER — Other Ambulatory Visit: Payer: Self-pay

## 2021-01-29 ENCOUNTER — Inpatient Hospital Stay: Payer: Medicare Other | Attending: Oncology | Admitting: Oncology

## 2021-01-29 ENCOUNTER — Other Ambulatory Visit: Payer: Self-pay | Admitting: *Deleted

## 2021-01-29 ENCOUNTER — Encounter: Payer: Self-pay | Admitting: Oncology

## 2021-01-29 VITALS — HR 73 | Temp 98.1°F | Resp 17 | Wt 132.0 lb

## 2021-01-29 DIAGNOSIS — Z87891 Personal history of nicotine dependence: Secondary | ICD-10-CM | POA: Insufficient documentation

## 2021-01-29 DIAGNOSIS — Z9049 Acquired absence of other specified parts of digestive tract: Secondary | ICD-10-CM | POA: Insufficient documentation

## 2021-01-29 DIAGNOSIS — D0511 Intraductal carcinoma in situ of right breast: Secondary | ICD-10-CM

## 2021-01-29 DIAGNOSIS — Z803 Family history of malignant neoplasm of breast: Secondary | ICD-10-CM | POA: Diagnosis not present

## 2021-01-29 DIAGNOSIS — M8580 Other specified disorders of bone density and structure, unspecified site: Secondary | ICD-10-CM | POA: Diagnosis not present

## 2021-01-29 DIAGNOSIS — Z79899 Other long term (current) drug therapy: Secondary | ICD-10-CM | POA: Diagnosis not present

## 2021-01-29 DIAGNOSIS — F419 Anxiety disorder, unspecified: Secondary | ICD-10-CM | POA: Insufficient documentation

## 2021-01-29 DIAGNOSIS — Z833 Family history of diabetes mellitus: Secondary | ICD-10-CM | POA: Diagnosis not present

## 2021-01-29 DIAGNOSIS — Z818 Family history of other mental and behavioral disorders: Secondary | ICD-10-CM | POA: Insufficient documentation

## 2021-01-29 DIAGNOSIS — Z7189 Other specified counseling: Secondary | ICD-10-CM

## 2021-01-29 MED ORDER — ANASTROZOLE 1 MG PO TABS: 1.0000 mg | ORAL_TABLET | Freq: Every day | ORAL | 3 refills | Status: DC

## 2021-01-29 NOTE — Progress Notes (Signed)
Patient here for oncology follow-up appointment, expresses no complaints or concerns at this time.    

## 2021-01-29 NOTE — Progress Notes (Signed)
Hematology/Oncology Consult note [facility]  Telephone:([phone] [phone]  Patient Care Team: Jearld Fenton, NP as PCP - General (Internal Medicine) Rico Junker, RN as Oncology Nurse Navigator   Name of the patient: Tracie Reed  [id number]  January 16, 1946   Date of visit: 01/29/21  Diagnosis-ER positive right breast DCIS  Chief complaint/ Reason for visit-discuss hormone therapy for DCIS  Heme/Onc history: Patient is a 76 year old female who recently underwent a screening mammogram on 08/22/2020 which showed possible calcifications in the right breast but no concerning findings in the left breast.  This was followed by diagnostic mammogram which showed pleomorphic calcifications in the upper outer right breast spanning 0.5 cm.   Patient underwent right lumpectomy which showed 2 mm low-grade DCIS.  There was an additional medial margin that was excised which showed an isolated focus of intermediate grade DCIS.  On biopsy the size of DCIS was 4 mm.  Margins negative.  ER greater than 90% positive.  Patient underwent postlumpectomy radiation treatment   Interval history-patient has tolerated radiation treatment well and is here for discussion about endocrine therapy.  Overall she is doing well and denies any specific complaints at this time  ECOG PS- 1 Pain scale- 0   Review of systems- Review of Systems  Constitutional:  Negative for chills, fever, malaise/fatigue and weight loss.  HENT:  Negative for congestion, ear discharge and nosebleeds.   Eyes:  Negative for blurred vision.  Respiratory:  Negative for cough, hemoptysis, sputum production, shortness of breath and wheezing.   Cardiovascular:  Negative for chest pain, palpitations, orthopnea and claudication.  Gastrointestinal:  Negative for abdominal pain, blood in stool, constipation, diarrhea, heartburn, melena, nausea and vomiting.  Genitourinary:  Negative for dysuria, flank  pain, frequency, hematuria and urgency.  Musculoskeletal:  Negative for back pain, joint pain and myalgias.  Skin:  Negative for rash.  Neurological:  Negative for dizziness, tingling, focal weakness, seizures, weakness and headaches.  Endo/Heme/Allergies:  Does not bruise/bleed easily.  Psychiatric/Behavioral:  Negative for depression and suicidal ideas. The patient does not have insomnia.      No Known Allergies   Past Medical History:  Diagnosis Date   Anxiety    DCIS (ductal carcinoma in situ) of breast    History of cataract    Pre-diabetes    last A1C in 2021 was 5.2   Sleep apnea    had uvula removed     Past Surgical History:  Procedure Laterality Date   BREAST BIOPSY Right 09/14/2020   Affirm bx-calcs, "Ribbon" marker-path pending   BREAST EXCISIONAL BIOPSY Right 1993   neg   BREAST LUMPECTOMY WITH RADIOFREQUENCY TAG IDENTIFICATION Right 10/12/2020   Procedure: BREAST LUMPECTOMY WITH RADIOFREQUENCY TAG IDENTIFICATION;  Surgeon: Ronny Bacon, MD;  Location: [facility] ORS;  Service: General;  Laterality: Right;   BUNIONECTOMY Bilateral    CATARACT EXTRACTION  09/03/2020   CHOLECYSTECTOMY     COLONOSCOPY WITH PROPOFOL N/A 07/31/2020   Procedure: COLONOSCOPY WITH PROPOFOL;  Surgeon: Lucilla Lame, MD;  Location: [facility] ENDOSCOPY;  Service: Endoscopy;  Laterality: N/A;   IRIDOTOMY / IRIDECTOMY Right    UVULECTOMY      Social History   Socioeconomic History   Marital status: Significant Other    Spouse name: Not on file   Number of children: 2   Years of education: Not on file   Highest education level: Bachelor's degree (e.g., BA, AB, BS)  Occupational History   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  Tobacco Use   Smoking status: Former    Packs/day: 0.50    Years: 5.00    Pack years: 2.50    Types: Cigarettes    Quit date: 11/12/1987    Years since quitting: 33.2   Smokeless tobacco: Never  Vaping Use   Vaping Use:  Never used  Substance and Sexual Activity   Alcohol use: Yes    Comment: occ beer once a week   Drug use: Not Currently    Comment: past marijuana - very remote use   Sexual activity: Yes  Other Topics Concern   Not on file  Social History Narrative   Not on file   Social Determinants of Health   Financial Resource Strain: Low Risk    Difficulty of Paying Living Expenses: Not hard at all  Food Insecurity: No Food Insecurity   Worried About Charity fundraiser in the Last Year: Never true   Ran Out of Food in the Last Year: Never true  Transportation Needs: No Transportation Needs   Lack of Transportation (Medical): No   Lack of Transportation (Non-Medical): No  Physical Activity: Sufficiently Active   Days of Exercise per Week: 7 days   Minutes of Exercise per Session: 30 min  Stress: No Stress Concern Present   Feeling of Stress : Not at all  Social Connections: Not on file  Intimate Partner Violence: Not on file    Family History  Problem Relation Age of Onset   Breast cancer Mother    Diabetes Mother    Depression Mother    Healthy Brother      Current Outpatient Medications:    acyclovir ointment (ZOVIRAX) 5 %, Apply 1 application topically every 3 (three) hours as needed. (Patient taking differently: Apply 1 application topically every 3 (three) hours as needed (cold sores).), Disp: 15 g, Rfl: 1   anastrozole (ARIMIDEX) 1 MG tablet, Take 1 tablet (1 mg total) by mouth daily., Disp: 30 tablet, Rfl: 3   CALCIUM-VITAMIN D PO, Take 2 tablets by mouth daily., Disp: , Rfl:    cetirizine (ZYRTEC) 10 MG tablet, Take 1 tablet (10 mg total) by mouth daily. (Patient taking differently: Take 10 mg by mouth at bedtime.), Disp: 90 tablet, Rfl: 3   diclofenac Sodium (VOLTAREN) 1 % GEL, Apply 1 application topically daily., Disp: , Rfl:    FLUoxetine (PROZAC) 10 MG tablet, TAKE 1 TABLET(10 MG) BY MOUTH DAILY, Disp: 90 tablet, Rfl: 0   ibuprofen (ADVIL) 800 MG tablet, Take 1 tablet  (800 mg total) by mouth every 8 (eight) hours as needed., Disp: 30 tablet, Rfl: 0   simvastatin (ZOCOR) 10 MG tablet, TAKE 1 TABLET(10 MG) BY MOUTH AT BEDTIME, Disp: 30 tablet, Rfl: 5   traZODone (DESYREL) 50 MG tablet, Take 1-2 tablets (50-100 mg total) by mouth at bedtime as needed for sleep., Disp: 180 tablet, Rfl: 0   valACYclovir (VALTREX) 1000 MG tablet, Take 2 tablets (2,000 mg) at the start of your cold sore.  Take 1 tablet (1,000 mg) daily until sores have resolved., Disp: 15 tablet, Rfl: 1  Physical exam:  Vitals:   01/29/21 1017  Pulse: 73  Resp: 17  Temp: 98.1 F (36.7 C)  TempSrc: Tympanic  SpO2: 97%  Weight: 132 lb (59.9 kg)   Physical Exam Constitutional:      General: She is not in acute distress. Cardiovascular:     Rate and Rhythm: Normal rate and regular  rhythm.     Heart sounds: Normal heart sounds.  Pulmonary:     Effort: Pulmonary effort is normal.  Skin:    General: Skin is warm and dry.  Neurological:     Mental Status: She is alert and oriented to person, place, and time.     CMP Latest Ref Rng & Units 10/09/2020  Glucose 70 - 99 mg/dL 104(H)  BUN 8 - 23 mg/dL 8  Creatinine 0.44 - 1.00 mg/dL 0.52  Sodium 135 - 145 mmol/L 139  Potassium 3.5 - 5.1 mmol/L 4.1  Chloride 98 - 111 mmol/L 103  CO2 22 - 32 mmol/L 28  Calcium 8.9 - 10.3 mg/dL 9.1  Total Protein 6.5 - 8.1 g/dL 6.3(L)  Total Bilirubin 0.3 - 1.2 mg/dL 0.7  Alkaline Phos 38 - 126 U/L 59  AST 15 - 41 U/L 22  ALT 0 - 44 U/L 18   CBC Latest Ref Rng & Units 01/09/2021  WBC 4.0 - 10.5 K/uL 5.3  Hemoglobin 12.0 - 15.0 g/dL 12.9  Hematocrit 36.0 - 46.0 % 38.7  Platelets 150 - 400 K/uL 209     Assessment and plan- Patient is a 76 y.o. female with right breast DCIS status postlumpectomy here to discuss adjuvant endocrine therapy  We discussed that patient's DCIS was ER positive and therefore it would be reasonable to consider adjuvant endocrine therapy for 5 years if she can tolerate it.   Discussed risks and benefits of Arimidex including all but not limited to fatigue, hot flashes, arthralgias and worsening bone health.  Baseline bone density scan showed osteopenia but her 10-year probability of a major osteoporotic fracture and major hip fracture was less than 20% and 3% respectively.  She therefore does not require any adjuvant bisphosphonates at this time.  She is agreeable to trying Arimidex along with calcium and vitamin D.  If she has trouble tolerating treatment she will get in touch with [place].  I will see her back in 3 months   Visit Diagnosis 1. Ductal carcinoma in situ (DCIS) of right breast   2. Goals of care, counseling/discussion      Dr. Randa Evens, MD, MPH [facility] at [facility] [phone] 01/29/2021 1:20 PM

## 2021-02-12 ENCOUNTER — Ambulatory Visit: Payer: Medicare Other

## 2021-02-20 ENCOUNTER — Ambulatory Visit: Payer: Medicare Other | Admitting: Radiation Oncology

## 2021-03-04 ENCOUNTER — Other Ambulatory Visit: Payer: Self-pay | Admitting: Internal Medicine

## 2021-03-04 ENCOUNTER — Other Ambulatory Visit: Payer: Self-pay | Admitting: Family Medicine

## 2021-03-04 DIAGNOSIS — R059 Cough, unspecified: Secondary | ICD-10-CM

## 2021-03-04 DIAGNOSIS — F5104 Psychophysiologic insomnia: Secondary | ICD-10-CM

## 2021-03-04 NOTE — Telephone Encounter (Signed)
Requested Prescriptions  Pending Prescriptions Disp Refills   cetirizine (ZYRTEC) 10 MG tablet [Pharmacy Med Name: CETIRIZINE 10MG  TABLETS] 90 tablet 3    Sig: TAKE 1 TABLET(10 MG) BY MOUTH DAILY     Ear, Nose, and Throat:  Antihistamines 2 Passed - 03/04/2021  3:46 AM      Passed - Cr in normal range and within 360 days    Creat  Date Value Ref Range Status  07/19/2020 0.67 0.60 - 0.93 mg/dL Final    Comment:    For patients >76 years of age, the reference limit for Creatinine is approximately 13% higher for people identified as African-American. .    Creatinine, Ser  Date Value Ref Range Status  10/09/2020 0.52 0.44 - 1.00 mg/dL Final         Passed - Valid encounter within last 12 months    Recent Outpatient Visits          6 months ago Encounter for completion of form with patient   [place], NP   7 months ago Encounter for general adult medical examination with abnormal findings   [facility] [place], Coralie Keens, NP   11 months ago Acute viral conjunctivitis of right eye   [facility] Kathrine Haddock, NP   1 year ago Right arm pain   [facility], Lupita Raider, FNP   1 year ago [facility] [place], Lupita Raider, FNP      Future Appointments            In 2 months Sindy Guadeloupe, MD [facility] at [facility] Oncology

## 2021-03-05 NOTE — Telephone Encounter (Signed)
Requested Prescriptions  Pending Prescriptions Disp Refills   traZODone (DESYREL) 50 MG tablet [Pharmacy Med Name: TRAZODONE 50MG  TABLETS] 180 tablet 0    Sig: TAKE 1 TO 2 TABLETS(50 TO 100 MG) BY MOUTH AT BEDTIME AS NEEDED FOR SLEEP     Psychiatry: Antidepressants - Serotonin Modulator Failed - 03/04/2021 10:46 AM      Failed - Valid encounter within last 6 months    Recent Outpatient Visits          6 months ago Encounter for completion of form with patient   [facility], Coralie Keens, NP   7 months ago Encounter for general adult medical examination with abnormal findings   [facility] [place], Coralie Keens, NP   11 months ago Acute viral conjunctivitis of right eye   [facility] Kathrine Haddock, NP   1 year ago Right arm pain   [facility] [place], Lupita Raider, FNP   1 year ago [place], FNP      Future Appointments            In 2 months Sindy Guadeloupe, MD [facility] [place] refill. Patient will need an office visit for further refills.

## 2021-03-11 ENCOUNTER — Other Ambulatory Visit: Payer: Self-pay

## 2021-03-11 ENCOUNTER — Ambulatory Visit
Admission: RE | Admit: 2021-03-11 | Discharge: 2021-03-11 | Disposition: A | Discharge status: Home / Self Care | Payer: Medicare Other | Source: Ambulatory Visit | Attending: Radiation Oncology | Admitting: Radiation Oncology

## 2021-03-11 ENCOUNTER — Encounter: Payer: Self-pay | Admitting: Oncology

## 2021-03-11 VITALS — BP 140/79 | HR 82 | Temp 96.9°F | Resp 16 | Wt 135.5 lb

## 2021-03-11 DIAGNOSIS — Z170 Estrogen receptor positive status [ER+]: Secondary | ICD-10-CM | POA: Diagnosis not present

## 2021-03-11 DIAGNOSIS — D0511 Intraductal carcinoma in situ of right breast: Secondary | ICD-10-CM | POA: Insufficient documentation

## 2021-03-11 DIAGNOSIS — Z923 Personal history of irradiation: Secondary | ICD-10-CM | POA: Insufficient documentation

## 2021-03-11 DIAGNOSIS — R5383 Other fatigue: Secondary | ICD-10-CM | POA: Diagnosis not present

## 2021-03-11 NOTE — Progress Notes (Signed)
Survivorship Care Plan visit completed.  Treatment summary reviewed and given to patient.  ASCO answers booklet reviewed and given to patient.  CARE program and Cancer Transitions discussed with patient along with other resources cancer center offers to patients and caregivers.  Patient verbalized understanding.    

## 2021-03-11 NOTE — Progress Notes (Signed)
Radiation Oncology Follow up Note  Name: Tracie Reed   Date:   03/11/2021 MRN:  [id number] DOB: [birth year]    This 76 y.o. female presents to the clinic today for 1 month follow-up status post whole breast radiation to her right breast for ER positive ductal carcinoma in situ.  REFERRING PROVIDER: Jearld Fenton, NP  HPI: Patient is a 76 year old female now at 1 month having completed whole breast radiation to her right breast for DCIS ER positive.  Seen today in routine follow-up she is doing well.  She specifically denies breast tenderness cough or bone pain.  She hasDecided not to go on antiestrogen therapy.  She states she is little more fatigued now than she was during treatments although I told her I do not attribute to the radiation and may be some other factors involved. COMPLICATIONS OF TREATMENT: none  FOLLOW UP COMPLIANCE: keeps appointments   PHYSICAL EXAM:  BP 140/79 (BP Location: Left Arm, Patient Position: Sitting)    Pulse 82    Temp (!) 96.9 F (36.1 C) (Tympanic)    Resp 16    Wt 135 lb 8 oz (61.5 kg)    SpO2 99%    BMI 21.22 kg/m  Lungs are clear to A&P cardiac examination essentially unremarkable with regular rate and rhythm. No dominant mass or nodularity is noted in either breast in 2 positions examined. Incision is well-healed. No axillary or supraclavicular adenopathy is appreciated. Cosmetic result is excellent.  Well-developed well-nourished patient in NAD. HEENT reveals PERLA, EOMI, discs not visualized.  Oral cavity is clear. No oral mucosal lesions are identified. Neck is clear without evidence of cervical or supraclavicular adenopathy. Lungs are clear to A&P. Cardiac examination is essentially unremarkable with regular rate and rhythm without murmur rub or thrill. Abdomen is benign with no organomegaly or masses noted. Motor sensory and DTR levels are equal and symmetric in the upper and lower extremities. Cranial nerves II through XII are grossly intact.  Proprioception is intact. No peripheral adenopathy or edema is identified. No motor or sensory levels are noted. Crude visual fields are within normal range.  RADIOLOGY RESULTS: No current films for review  PLAN: Present time I suggested the patient continue to do more exercise including walking to help with some of her fatigue.  I have asked to see her back in 4 to 5 months for follow-up.  Patient is to call with any concerns.  I would like to take this opportunity to thank you for allowing me to participate in the care of your patient.Noreene Filbert, MD

## 2021-03-11 NOTE — Progress Notes (Signed)
Pt reports she has decided not to take the Arimidex ordered in January by Dr Janese Banks.

## 2021-03-12 ENCOUNTER — Other Ambulatory Visit: Payer: Self-pay

## 2021-03-12 DIAGNOSIS — F419 Anxiety disorder, unspecified: Secondary | ICD-10-CM

## 2021-03-13 ENCOUNTER — Other Ambulatory Visit: Payer: Self-pay

## 2021-03-13 DIAGNOSIS — F419 Anxiety disorder, unspecified: Secondary | ICD-10-CM

## 2021-03-13 MED ORDER — FLUOXETINE HCL 10 MG PO TABS: ORAL_TABLET | ORAL | 1 refills | Status: DC

## 2021-03-14 ENCOUNTER — Other Ambulatory Visit: Payer: Self-pay

## 2021-03-14 ENCOUNTER — Ambulatory Visit (INDEPENDENT_AMBULATORY_CARE_PROVIDER_SITE_OTHER): Payer: Medicare Other

## 2021-03-14 VITALS — BP 109/70 | HR 72 | Temp 98.8°F | Resp 18 | Ht 67.0 in | Wt 133.6 lb

## 2021-03-14 DIAGNOSIS — Z0000 Encounter for general adult medical examination without abnormal findings: Secondary | ICD-10-CM | POA: Diagnosis not present

## 2021-03-14 NOTE — Progress Notes (Signed)
Influenza: 10/23/2020  Moderna: 05/09/20, 12/19/19, 05/26/19, 04/28/19 Moderna Bivalent:  10/23/2020 Prevnar: 01/11/19 Pneumonia vaccine : 11/02/20, 02/21/20 Zoster(Shingrix): 11/02/20, 12/15/19 TDAP: pt notified that she can get this vaccine at no cost to her at her local pharmacy.   Dexa Scan: 08/22/2019 Colonoscopy: 07/31/2020, due 08/01/2030 Mammogram: 08/22/2020  PCP: Webb Silversmith, NP Oncologist: Randa Evens, MD Radiologist: Noreene Filbert, MD Ophthalmologist: Sudie Grumbling Dentist: [facility],   [place], [place]  83mths ago  HPI:  Patient presents to clinic today for their subsequent annual Medicare wellness exam.  Past Medical History:  Diagnosis Date   Anxiety    DCIS (ductal carcinoma in situ) of breast    History of cataract    Pre-diabetes    last A1C in 2021 was 5.2   Sleep apnea    had uvula removed    Current Outpatient Medications  Medication Sig Dispense Refill   acyclovir ointment (ZOVIRAX) 5 % Apply 1 application topically every 3 (three) hours as needed. (Patient taking differently: Apply 1 application topically every 3 (three) hours as needed (cold sores).) 15 g 1   CALCIUM-VITAMIN D PO Take 2 tablets by mouth daily.     cetirizine (ZYRTEC) 10 MG tablet TAKE 1 TABLET(10 MG) BY MOUTH DAILY 90 tablet 1   diclofenac Sodium (VOLTAREN) 1 % GEL Apply 1 application topically daily.     FLUoxetine (PROZAC) 10 MG tablet TAKE 1 TABLET(10 MG) BY MOUTH DAILY 90 tablet 1   ibuprofen (ADVIL) 800 MG tablet Take 1 tablet (800 mg total) by mouth every 8 (eight) hours as needed. 30 tablet 0   simvastatin (ZOCOR) 10 MG tablet TAKE 1 TABLET(10 MG) BY MOUTH AT BEDTIME 30 tablet 5   traZODone (DESYREL) 50 MG tablet TAKE 1 TO 2 TABLETS(50 TO 100 MG) BY MOUTH AT BEDTIME AS NEEDED FOR SLEEP 30 tablet 0   valACYclovir (VALTREX) 1000 MG tablet Take 2 tablets (2,000 mg) at the start of your cold sore.  Take 1 tablet (1,000 mg) daily until sores have resolved. 15 tablet 1    anastrozole (ARIMIDEX) 1 MG tablet Take 1 tablet (1 mg total) by mouth daily. (Patient not taking: Reported on 03/11/2021) 30 tablet 3   No current facility-administered medications for this visit.    No Known Allergies  Family History  Problem Relation Age of Onset   Breast cancer Mother    Diabetes Mother    Depression Mother    Healthy Brother     Social History   Socioeconomic History   Marital status: Significant Other    Spouse name: Not on file   Number of children: 2   Years of education: Not on file   Highest education level: Bachelor's degree (e.g., BA, AB, BS)  Occupational History   Occupation: self employed    Comment: Holiday representative  Tobacco Use   Smoking status: Former    Packs/day: 0.50    Years: 5.00    Pack years: 2.50    Types: Cigarettes    Quit date: 11/12/1987    Years since quitting: 33.3   Smokeless tobacco: Never  Vaping Use   Vaping Use: Never used  Substance and Sexual Activity   Alcohol use: Yes    Comment: occ beer once a week   Drug use: Not Currently    Comment: past marijuana - very remote use   Sexual activity: Yes  Other Topics Concern   Not on file  Social History Narrative   Not  on file   Social Determinants of Health   Financial Resource Strain: Low Risk    Difficulty of Paying Living Expenses: Not hard at all  Food Insecurity: No Food Insecurity   Worried About Charity fundraiser in the Last Year: Never true   Arboriculturist in the Last Year: Never true  Transportation Needs: No Transportation Needs   Lack of Transportation (Medical): No   Lack of Transportation (Non-Medical): No  Physical Activity: Sufficiently Active   Days of Exercise per Week: 7 days   Minutes of Exercise per Session: 30 min  Stress: No Stress Concern Present   Feeling of Stress : Not at all  Social Connections: Moderately Isolated   Frequency of Communication with Friends and Family: More than three times a week    Frequency of Social Gatherings with Friends and Family: More than three times a week   Attends Religious Services: Never   Marine scientist or Organizations: No   Attends Archivist Meetings: Never   Marital Status: Living with partner  Intimate Partner Violence: Not on file    Hospitiliaztions: No hospitalization .  Surgery: Rt  lumpectomy 10/12/2020  Health Maintenance: Influenza: 10/23/2020  Moderna: 05/09/20, 12/19/19, 05/26/19, 04/28/19 Moderna Bivalent:  10/23/2020 Prevnar: 01/11/19 Pneumonia vaccine : 11/02/20, 02/21/20 Zoster(Shingrix): 11/02/20, 12/15/19 TDAP: pt notified that she can get this vaccine at no cost to her at her local pharmacy.   Dexa Scan: 08/22/2019 Colonoscopy: 07/31/2020, due 08/01/2030 Mammogram: 08/22/2020  Providers: PCP: Webb Silversmith, NP Oncologist: Randa Evens, MD Radiologist: Noreene Filbert, MD Ophthalmologist: Sudie Grumbling Dentist: [facility],   [place], [place]  21mths ago  I have personally reviewed and have noted:  1. The patient's medical and social history 2. Their use of alcohol, tobacco or illicit drugs 3. Their current medications and supplements 4. The patient's functional ability including ADL's, fall risks, home safety risks and hearing or visual impairment. 5. Diet and physical activities 6. Evidence for depression or mood disorder  Subjective:   Review of Systems:   Constitutional: Denies fever, malaise, fatigue, headache or abrupt weight changes.  HEENT: Denies eye pain, eye redness, ear pain, ringing in the ears, wax buildup, runny nose, nasal congestion, bloody nose, or sore throat. Respiratory: Denies difficulty breathing, shortness of breath, cough or sputum production.   Cardiovascular: Denies chest pain, chest tightness, palpitations or swelling in the hands or feet.  Gastrointestinal: Denies abdominal pain, bloating, constipation, diarrhea or blood in the stool.  GU: Denies urgency,  frequency, pain with urination, burning sensation, blood in urine, odor or discharge. Musculoskeletal: Denies decrease in range of motion, difficulty with gait, muscle pain or joint pain and swelling.  Skin: Denies redness, rashes, lesions or ulcercations.  Neurological: Denies dizziness, difficulty with memory, difficulty with speech or problems with balance and coordination.  Psych: Denies anxiety, depression, SI/HI.  No other specific complaints in a complete review of systems (except as listed in HPI above).  Objective:  PE:   BP 109/70 (BP Location: Left Arm, Patient Position: Sitting, Cuff Size: Small)    Pulse 72    Temp 98.8 F (37.1 C) (Oral)    Resp 18    Ht 5\' 7"  (1.702 m)    Wt 133 lb 9.6 oz (60.6 kg)    SpO2 99%    BMI 20.92 kg/m  Wt Readings from Last 3 Encounters:  03/14/21 133 lb 9.6 oz (60.6 kg)  03/11/21 135 lb 8 oz (61.5 kg)  01/29/21 132 lb (59.9 kg)     BMET    Component Value Date/Time   NA 139 10/09/2020 1143   K 4.1 10/09/2020 1143   CL 103 10/09/2020 1143   CO2 28 10/09/2020 1143   GLUCOSE 104 (H) 10/09/2020 1143   BUN 8 10/09/2020 1143   CREATININE 0.52 10/09/2020 1143   CREATININE 0.67 07/19/2020 1026   CALCIUM 9.1 10/09/2020 1143   GFRNONAA >60 10/09/2020 1143   GFRNONAA 86 07/19/2020 1026   GFRAA 100 07/19/2020 1026    Lipid Panel     Component Value Date/Time   CHOL 230 (H) 07/19/2020 1026   TRIG 91 07/19/2020 1026   HDL 59 07/19/2020 1026   CHOLHDL 3.9 07/19/2020 1026   LDLCALC 151 (H) 07/19/2020 1026    CBC    Component Value Date/Time   WBC 5.3 01/09/2021 1048   RBC 4.22 01/09/2021 1048   HGB 12.9 01/09/2021 1048   HCT 38.7 01/09/2021 1048   PLT 209 01/09/2021 1048   MCV 91.7 01/09/2021 1048   MCH 30.6 01/09/2021 1048   MCHC 33.3 01/09/2021 1048   RDW 14.3 01/09/2021 1048   LYMPHSABS 2.0 10/09/2020 1143   MONOABS 0.5 10/09/2020 1143   EOSABS 0.1 10/09/2020 1143   BASOSABS 0.0 10/09/2020 1143    Hgb A1C Lab Results   Component Value Date   HGBA1C 5.2 07/05/2019      Assessment and Plan:   Medicare Annual Wellness Visit:  Diet: Heart healthy  Physical activity: moderate exercise  Depression/mood screen: Negative,  Flowsheet Row Clinical Support from 03/14/2021 in [facility]  PHQ-9 Total Score 1      Hearing: Intact to whispered voice Visual acuity: Grossly normal, performs annual eye exam  ADLs: Capable Fall risk: None Home safety: Good Cognitive evaluation:  6CIT Screen 03/14/2021 02/07/2020  What Year? 0 points 0 points  What month? 0 points 0 points  What time? 0 points 0 points  Count back from 20 0 points 0 points  Months in reverse 0 points 0 points  Repeat phrase 0 points 0 points  Total Score 0 0     EOL planning: Adv directives, full code  Nurse's Notes: The pt requesting a referral for allergy testing to rule out a shellfish allergy. She currently avoid shellfish because the last time she ate it she developed nausea and vomiting.    Next appointment: Annual Physical scheduled 08/21/2021  Wilson Singer, CMA

## 2021-03-21 NOTE — Addendum Note (Signed)
Addended by: Wilson Singer on: 03/21/2021 04:00 PM   Modules accepted: Level of Service

## 2021-04-08 ENCOUNTER — Other Ambulatory Visit: Payer: Self-pay

## 2021-04-08 DIAGNOSIS — D0511 Intraductal carcinoma in situ of right breast: Secondary | ICD-10-CM

## 2021-04-16 ENCOUNTER — Other Ambulatory Visit: Payer: Self-pay | Admitting: Internal Medicine

## 2021-04-17 NOTE — Telephone Encounter (Signed)
Requested Prescriptions  ?Pending Prescriptions Disp Refills  ?? simvastatin (ZOCOR) 10 MG tablet [Pharmacy Med Name: SIMVASTATIN '10MG'$  TABLETS] 30 tablet 4  ?  Sig: TAKE 1 TABLET(10 MG) BY MOUTH AT BEDTIME  ?  ? Cardiovascular:  Antilipid - Statins Failed - 04/16/2021  8:04 AM  ?  ?  Failed - Lipid Panel in normal range within the last 12 months  ?  Cholesterol  ?Date Value Ref Range Status  ?07/19/2020 230 (H) <200 mg/dL Final  ? ?LDL Cholesterol (Calc)  ?Date Value Ref Range Status  ?07/19/2020 151 (H) mg/dL (calc) Final  ?  Comment:  ?  Reference range: <100 ?Marland Kitchen ?Desirable range <100 mg/dL for primary prevention;   ?<70 mg/dL for patients with CHD or diabetic patients  ?with > or = 2 CHD risk factors. ?. ?LDL-C is now calculated using the Martin-Hopkins  ?calculation, which is a validated novel method providing  ?better accuracy than the Friedewald equation in the  ?estimation of LDL-C.  ?Cresenciano Genre et al. Annamaria Helling. 7425;956(38): 2061-2068  ?(http://education.[employer].com/faq/FAQ164) ?  ? ?HDL  ?Date Value Ref Range Status  ?07/19/2020 59 > OR = 50 mg/dL Final  ? ?Triglycerides  ?Date Value Ref Range Status  ?07/19/2020 91 <150 mg/dL Final  ? ?  ?  ?  Passed - Patient is not pregnant  ?  ?  Passed - Valid encounter within last 12 months  ?  Recent Outpatient Visits   ?      ? 8 months ago Encounter for completion of form with patient  ? [facility] [place], Coralie Keens, NP  ? 9 months ago Encounter for general adult medical examination with abnormal findings  ? [facility] [place], [place] W, NP  ? 1 year ago Acute viral conjunctivitis of right eye  ? [facility] Kathrine Haddock, NP  ? 1 year ago Right arm pain  ? [place], FNP  ? 1 year ago Anxiety  ? [facility], Lupita Raider, FNP  ?  ?  ?Future Appointments   ?        ? In 3 weeks Sindy Guadeloupe, MD [facility] [place]  ? In 4 months Baity,  Coralie Keens, NP [facility], [place]  ?  ? ?  ?  ?  ? ?

## 2021-04-25 ENCOUNTER — Other Ambulatory Visit: Payer: Self-pay | Admitting: Internal Medicine

## 2021-04-25 DIAGNOSIS — F5104 Psychophysiologic insomnia: Secondary | ICD-10-CM

## 2021-04-25 NOTE — Telephone Encounter (Signed)
Requested medications are due for refill today.  yes ? ?Requested medications are on the active medications list.  yes ? ?Last refill. 03/05/2021 #30 0 refills - courtesy refill ? ?Future visit scheduled.   yes ? ?Notes to clinic.  Courtesy refill already given.  ? ? ? ?Requested Prescriptions  ?Pending Prescriptions Disp Refills  ? traZODone (DESYREL) 50 MG tablet [Pharmacy Med Name: TRAZODONE '50MG'$  TABLETS] 30 tablet 0  ?  Sig: TAKE 1 TO 2 TABLETS(50 TO 100 MG) BY MOUTH AT BEDTIME AS NEEDED FOR SLEEP  ?  ? Psychiatry: Antidepressants - Serotonin Modulator Passed - 04/25/2021  8:12 AM  ?  ?  Passed - Valid encounter within last 6 months  ?  Recent Outpatient Visits   ? ?      ? 8 months ago Encounter for completion of form with patient  ? [facility] [place], Coralie Keens, NP  ? 9 months ago Encounter for general adult medical examination with abnormal findings  ? [facility] [place], [place] W, NP  ? 1 year ago Acute viral conjunctivitis of right eye  ? [facility] Kathrine Haddock, NP  ? 1 year ago Right arm pain  ? [place], FNP  ? 1 year ago Anxiety  ? [facility], Lupita Raider, FNP  ? ?  ?  ?Future Appointments   ? ?        ? In 1 week Sindy Guadeloupe, MD [facility] [place]  ? In 3 months Baity, Coralie Keens, NP [facility], [place]  ? ?  ? ?  ?  ?  ?  ?

## 2021-05-08 ENCOUNTER — Inpatient Hospital Stay: Payer: Medicare Other | Attending: Oncology | Admitting: Oncology

## 2021-05-08 ENCOUNTER — Encounter: Payer: Self-pay | Admitting: Oncology

## 2021-05-08 DIAGNOSIS — Z86000 Personal history of in-situ neoplasm of breast: Secondary | ICD-10-CM | POA: Diagnosis not present

## 2021-05-08 DIAGNOSIS — Z08 Encounter for follow-up examination after completed treatment for malignant neoplasm: Secondary | ICD-10-CM

## 2021-05-08 NOTE — Progress Notes (Signed)
RN called and verified pt name and date of birth.  Pt denies any concerns and med list reviewed.   ?

## 2021-05-11 NOTE — Progress Notes (Signed)
I connected with Tracie Reed on 05/11/21 at 11:30 AM EDT by video enabled telemedicine visit and verified that I am speaking with the correct person using two identifiers. ?  ?I discussed the limitations, risks, security and privacy concerns of performing an evaluation and management service by telemedicine and the availability of in-person appointments. I also discussed with the patient that there may be a patient responsible charge related to this service. The patient expressed understanding and agreed to proceed. ? ?Other persons participating in the visit and their role in the encounter:  none ? ?Patient's location:  home ?Provider's location:  work ? ?Chief Complaint: Routine follow-up of ER positive DCIS ? ?History of present illness: Patient is a 76 year old female who recently underwent a screening mammogram on 08/22/2020 which showed possible calcifications in the right breast but no concerning findings in the left breast.  This was followed by diagnostic mammogram which showed pleomorphic calcifications in the upper outer right breast spanning 0.5 cm. ?  ?Patient underwent right lumpectomy which showed 2 mm low-grade DCIS.  There was an additional medial margin that was excised which showed an isolated focus of intermediate grade DCIS.  On biopsy the size of DCIS was 4 mm.  Margins negative.  ER greater than 90% positive.  Patient underwent postlumpectomy radiation treatment ? ?Interval history patient completed her radiation treatment sometime in January 2023 but decided not to take any Arimidex.  She is concerned about side effects.  Denies any breast concerns at this time ? ? ? ?Review of Systems  ?Constitutional:  Negative for chills, fever, malaise/fatigue and weight loss.  ?HENT:  Negative for congestion, ear discharge and nosebleeds.   ?Eyes:  Negative for blurred vision.  ?Respiratory:  Negative for cough, hemoptysis, sputum production, shortness of breath and wheezing.   ?Cardiovascular:   Negative for chest pain, palpitations, orthopnea and claudication.  ?Gastrointestinal:  Negative for abdominal pain, blood in stool, constipation, diarrhea, heartburn, melena, nausea and vomiting.  ?Genitourinary:  Negative for dysuria, flank pain, frequency, hematuria and urgency.  ?Musculoskeletal:  Negative for back pain, joint pain and myalgias.  ?Skin:  Negative for rash.  ?Neurological:  Negative for dizziness, tingling, focal weakness, seizures, weakness and headaches.  ?Endo/Heme/Allergies:  Does not bruise/bleed easily.  ?Psychiatric/Behavioral:  Negative for depression and suicidal ideas. The patient does not have insomnia.   ? ?No Known Allergies ? ?Past Medical History:  ?Diagnosis Date  ? Anxiety   ? DCIS (ductal carcinoma in situ) of breast   ? History of cataract   ? Pre-diabetes   ? last A1C in 2021 was 5.2  ? Sleep apnea   ? had uvula removed  ? ? ?Past Surgical History:  ?Procedure Laterality Date  ? BREAST BIOPSY Right 09/14/2020  ? Affirm bx-calcs, "Ribbon" marker-path pending  ? BREAST EXCISIONAL BIOPSY Right 1993  ? neg  ? BREAST LUMPECTOMY WITH RADIOFREQUENCY TAG IDENTIFICATION Right 10/12/2020  ? Procedure: BREAST LUMPECTOMY WITH RADIOFREQUENCY TAG IDENTIFICATION;  Surgeon: Ronny Bacon, MD;  Location: [facility] ORS;  Service: General;  Laterality: Right;  ? BUNIONECTOMY Bilateral   ? CATARACT EXTRACTION  09/03/2020  ? CHOLECYSTECTOMY    ? COLONOSCOPY WITH PROPOFOL N/A 07/31/2020  ? Procedure: COLONOSCOPY WITH PROPOFOL;  Surgeon: Lucilla Lame, MD;  Location: [facility] ENDOSCOPY;  Service: Endoscopy;  Laterality: N/A;  ? IRIDOTOMY / IRIDECTOMY Right   ? UVULECTOMY    ? ? ?Social History  ? ?Socioeconomic History  ? Marital status: Significant Other  ?  Spouse name: Not  on file  ? Number of children: 2  ? Years of education: Not on file  ? Highest education level: Bachelor's degree (e.g., BA, AB, BS)  ?Occupational History  ? Occupation: self employed  ?  Comment: real Armed forces technical officer  ?Tobacco Use  ? Smoking status: Former  ?  Packs/day: 0.50  ?  Years: 5.00  ?  Pack years: 2.50  ?  Types: Cigarettes  ?  Quit date: 11/12/1987  ?  Years since quitting: 33.5  ? Smokeless tobacco: Never  ?Vaping Use  ? Vaping Use: Never used  ?Substance and Sexual Activity  ? Alcohol use: Yes  ?  Comment: occ beer once a week  ? Drug use: Not Currently  ?  Comment: past marijuana - very remote use  ? Sexual activity: Yes  ?Other Topics Concern  ? Not on file  ?Social History Narrative  ? Not on file  ? ?Social Determinants of Health  ? ?Financial Resource Strain: Low Risk   ? Difficulty of Paying Living Expenses: Not hard at all  ?Food Insecurity: No Food Insecurity  ? Worried About Charity fundraiser in the Last Year: Never true  ? Ran Out of Food in the Last Year: Never true  ?Transportation Needs: No Transportation Needs  ? Lack of Transportation (Medical): No  ? Lack of Transportation (Non-Medical): No  ?Physical Activity: Sufficiently Active  ? Days of Exercise per Week: 7 days  ? Minutes of Exercise per Session: 30 min  ?Stress: No Stress Concern Present  ? Feeling of Stress : Not at all  ?Social Connections: Moderately Isolated  ? Frequency of Communication with Friends and Family: More than three times a week  ? Frequency of Social Gatherings with Friends and Family: More than three times a week  ? Attends Religious Services: Never  ? Active Member of Clubs or Organizations: No  ? Attends Archivist Meetings: Never  ? Marital Status: Living with partner  ?Intimate Partner Violence: Not on file  ? ? ?Family History  ?Problem Relation Age of Onset  ? Breast cancer Mother   ? Diabetes Mother   ? Depression Mother   ? Healthy Brother   ? ? ? ?Current Outpatient Medications:  ?  acyclovir ointment (ZOVIRAX) 5 %, Apply 1 application topically every 3 (three) hours as needed. (Patient taking differently: Apply 1 application. topically every 3 (three) hours as needed (cold sores).), Disp: 15 g,  Rfl: 1 ?  CALCIUM-VITAMIN D PO, Take 2 tablets by mouth daily., Disp: , Rfl:  ?  cetirizine (ZYRTEC) 10 MG tablet, TAKE 1 TABLET(10 MG) BY MOUTH DAILY, Disp: 90 tablet, Rfl: 1 ?  diclofenac Sodium (VOLTAREN) 1 % GEL, Apply 1 application topically daily., Disp: , Rfl:  ?  FLUoxetine (PROZAC) 10 MG tablet, TAKE 1 TABLET(10 MG) BY MOUTH DAILY, Disp: 90 tablet, Rfl: 1 ?  ibuprofen (ADVIL) 800 MG tablet, Take 1 tablet (800 mg total) by mouth every 8 (eight) hours as needed., Disp: 30 tablet, Rfl: 0 ?  simvastatin (ZOCOR) 10 MG tablet, TAKE 1 TABLET(10 MG) BY MOUTH AT BEDTIME, Disp: 30 tablet, Rfl: 4 ?  traZODone (DESYREL) 50 MG tablet, TAKE 1 TO 2 TABLETS(50 TO 100 MG) BY MOUTH AT BEDTIME AS NEEDED FOR SLEEP, Disp: 90 tablet, Rfl: 0 ?  valACYclovir (VALTREX) 1000 MG tablet, Take 2 tablets (2,000 mg) at the start of your cold sore.  Take 1 tablet (1,000 mg) daily until sores have resolved.,  Disp: 15 tablet, Rfl: 1 ?  anastrozole (ARIMIDEX) 1 MG tablet, Take 1 tablet (1 mg total) by mouth daily. (Patient not taking: Reported on 03/11/2021), Disp: 30 tablet, Rfl: 3 ? ?No results found. ? ?No images are attached to the encounter. ? ? ? ?  Latest Ref Rng & Units 10/09/2020  ? 11:43 AM  ?CMP  ?Glucose 70 - 99 mg/dL 104    ?BUN 8 - 23 mg/dL 8    ?Creatinine 0.44 - 1.00 mg/dL 0.52    ?Sodium 135 - 145 mmol/L 139    ?Potassium 3.5 - 5.1 mmol/L 4.1    ?Chloride 98 - 111 mmol/L 103    ?CO2 22 - 32 mmol/L 28    ?Calcium 8.9 - 10.3 mg/dL 9.1    ?Total Protein 6.5 - 8.1 g/dL 6.3    ?Total Bilirubin 0.3 - 1.2 mg/dL 0.7    ?Alkaline Phos 38 - 126 U/L 59    ?AST 15 - 41 U/L 22    ?ALT 0 - 44 U/L 18    ? ? ?  Latest Ref Rng & Units 01/09/2021  ? 10:48 AM  ?CBC  ?WBC 4.0 - 10.5 K/uL 5.3    ?Hemoglobin 12.0 - 15.0 g/dL 12.9    ?Hematocrit 36.0 - 46.0 % 38.7    ?Platelets 150 - 400 K/uL 209    ? ? ? ?Observation/objective: Appears in no acute distress over video visit today.  Breathing is nonlabored ? ?Assessment and plan: Patient is a  76 year old female with history of right breast ER positive DCIS and this is a routine follow-up visit. ? ?Patient completed lumpectomy and adjuvant radiation treatment but did not wish to take Arimidex due to concern f

## 2021-05-14 ENCOUNTER — Ambulatory Visit
Admission: RE | Admit: 2021-05-14 | Discharge: 2021-05-14 | Disposition: A | Discharge status: Home / Self Care | Payer: Medicare Other | Source: Ambulatory Visit | Attending: Surgery | Admitting: Surgery

## 2021-05-14 ENCOUNTER — Other Ambulatory Visit: Payer: Self-pay | Admitting: Internal Medicine

## 2021-05-14 DIAGNOSIS — R922 Inconclusive mammogram: Secondary | ICD-10-CM | POA: Diagnosis not present

## 2021-05-14 DIAGNOSIS — Z1231 Encounter for screening mammogram for malignant neoplasm of breast: Secondary | ICD-10-CM

## 2021-05-14 DIAGNOSIS — D0511 Intraductal carcinoma in situ of right breast: Secondary | ICD-10-CM | POA: Diagnosis not present

## 2021-05-14 HISTORY — DX: Personal history of irradiation: Z92.3

## 2021-05-21 ENCOUNTER — Encounter: Payer: Self-pay | Admitting: Surgery

## 2021-05-21 ENCOUNTER — Ambulatory Visit: Payer: Medicare Other | Admitting: Surgery

## 2021-05-21 ENCOUNTER — Other Ambulatory Visit: Payer: Self-pay

## 2021-05-21 VITALS — BP 127/83 | HR 69 | Temp 98.0°F | Ht 67.0 in | Wt 132.2 lb

## 2021-05-21 DIAGNOSIS — D0511 Intraductal carcinoma in situ of right breast: Secondary | ICD-10-CM

## 2021-05-21 NOTE — Progress Notes (Signed)
Surgical Clinic Progress/Follow-up Note  ? ?HPI:  ?76 y.o. Female presents to clinic for DCIS right breast follow-up.  About 6 months follow the last evaluation.   She has since completed her radiation.  She just started to take her Arimidex about 2 weeks ago.  She reports she is tolerating it well. ?Patient reports  improvement/resolution of prior scar tenderness, when she had been not wearing a bra during radiation.  She reports the pain is completely resolved.  And she denies N/V, fever/chills, CP, or SOB. ? ?Review of Systems:  ?Constitutional: denies fever/chills  ?ENT: denies sore throat, hearing problems  ?Respiratory: denies shortness of breath, wheezing  ?Cardiovascular: denies chest pain, palpitations  ?Gastrointestinal: denies abdominal pain, N/V, or diarrhea/and bowel function as per interval history ?Skin: Denies any other rashes or skin discolorations except post-surgical wounds as per interval history ? ?Vital Signs:  ?BP 127/83   Pulse 69   Temp 98 ?F (36.7 ?C)   Ht '5\' 7"'$  (1.702 m)   Wt 132 lb 3.2 oz (60 kg)   SpO2 99%   BMI 20.71 kg/m?   ? ?Physical Exam:  ?Constitutional:  ?-- Normal body habitus  ?-- Awake, alert, and oriented x3  ?Pulmonary:  ?-- Breathing non-labored at rest ?Cardiovascular:  ?-- Regular rate. ?Gastrointestinal:  ?-- Soft and non-distended, non-tender ?GU  --Freda Munro present as chaperone.  Right breast evaluated, lateral scar appears to be well-healed and consistent with healthy scar.  Minimal radiation changes appreciated.  No other suspicious, nor dominant nodularity present.  No appreciable skin changes. ?Musculoskeletal / Integumentary:  ?-- Wounds or skin discoloration: None appreciated except post-surgical incisions as described above ?-- Extremities: B/L UE and LE FROM, hands and feet warm, no edema  ? ?Imaging: CLINICAL DATA:  RIGHT lumpectomy September 2022 for DCIS which ?presented as a calcifications. Negative margins. Completed ?radiation. ?  ?EXAM: ?DIGITAL  DIAGNOSTIC UNILATERAL RIGHT MAMMOGRAM WITH TOMOSYNTHESIS AND ?CAD ?  ?TECHNIQUE: ?Right digital diagnostic mammography and breast tomosynthesis was ?performed. The images were evaluated with computer-aided detection. ?  ?COMPARISON:  Previous exam(s). ?  ?ACR Breast Density Category c: The breast tissue is heterogeneously ?dense, which may obscure small masses. ?  ?FINDINGS: ?There is density and architectural distortion within the RIGHT ?breast, consistent with postsurgical changes. These are new in ?comparison to prior. No new suspicious findings are noted in the ?RIGHT breast. No suspicious mass, distortion, or microcalcifications ?are identified to suggest presence of malignancy. ?  ?IMPRESSION: ?No mammographic evidence of malignancy in the RIGHT breast. ?  ?RECOMMENDATION: ?Bilateral diagnostic mammogram, due August 2023 ?  ?I have discussed the findings and recommendations with the patient. ?If applicable, a reminder letter will be sent to the patient ?regarding the next appointment. ?  ?BI-RADS CATEGORY  2: Benign. ?  ?  ?Electronically Signed ?  By: Valentino Saxon M.D. ?  On: 05/14/2021 10:05 ? ? ?Assessment:  ?76 y.o. yo Female with a problem list including...  ?Patient Active Problem List  ? Diagnosis Date Noted  ? Breast neoplasm, Tis (DCIS), right 09/20/2020  ? Pure hypercholesterolemia 07/23/2020  ? Osteopenia 08/22/2019  ? Anxiety 12/18/2017  ? 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  ?presents to clinic for follow-up evaluation of right breast DCIS, progressing well. ? ?Plan:  ?            - return to clinic in 6 months or as needed, instructed to call office if any questions or concerns.   ? ?  It appears her next set of imaging will be diagnostic, bilateral in August of this year. ? ?All of the above recommendations were discussed with the patient, and all of patient'squestions were answered to her expressed satisfaction. ? ?These notes generated with voice  recognition software. I apologize for typographical errors. ? ?Ronny Bacon, MD, FACS ?[place]: [facility] ?General Surgery - Partnering for exceptional care. ?Office: [phone]  ?

## 2021-05-21 NOTE — Patient Instructions (Signed)
We will contact you in July 2023 to schedule Mammogram and breast exam for August 2023. Please call the office if you do not hear from our office.  ?

## 2021-06-07 ENCOUNTER — Other Ambulatory Visit: Payer: Self-pay | Admitting: Internal Medicine

## 2021-06-07 DIAGNOSIS — F5104 Psychophysiologic insomnia: Secondary | ICD-10-CM

## 2021-06-10 ENCOUNTER — Other Ambulatory Visit: Payer: Self-pay

## 2021-06-10 DIAGNOSIS — F5104 Psychophysiologic insomnia: Secondary | ICD-10-CM

## 2021-06-10 NOTE — Telephone Encounter (Signed)
Requested Prescriptions  ?Pending Prescriptions Disp Refills  ?? traZODone (DESYREL) 50 MG tablet [Pharmacy Med Name: TRAZODONE '50MG'$  TABLETS] 90 tablet 0  ?  Sig: TAKE 1 TO 2 TABLETS(50 TO 100 MG) BY MOUTH AT BEDTIME AS NEEDED FOR SLEEP  ?  ? Psychiatry: Antidepressants - Serotonin Modulator Passed - 06/07/2021 10:43 AM  ?  ?  Passed - Valid encounter within last 6 months  ?  Recent Outpatient Visits   ?      ? 10 months ago Encounter for completion of form with patient  ? [facility] [place], Coralie Keens, NP  ? 10 months ago Encounter for general adult medical examination with abnormal findings  ? [facility] [place], [place] W, NP  ? 1 year ago Acute viral conjunctivitis of right eye  ? [facility] Kathrine Haddock, NP  ? 1 year ago Right arm pain  ? [place], FNP  ? 1 year ago Anxiety  ? [facility], Lupita Raider, FNP  ?  ?  ?Future Appointments   ?        ? In 2 months Baity, Coralie Keens, NP [facility], [place]  ?  ? ?  ?  ?  ? ? ?

## 2021-07-12 DIAGNOSIS — H40003 Preglaucoma, unspecified, bilateral: Secondary | ICD-10-CM | POA: Diagnosis not present

## 2021-07-12 DIAGNOSIS — H26493 Other secondary cataract, bilateral: Secondary | ICD-10-CM | POA: Diagnosis not present

## 2021-07-12 DIAGNOSIS — H43391 Other vitreous opacities, right eye: Secondary | ICD-10-CM | POA: Diagnosis not present

## 2021-07-22 ENCOUNTER — Other Ambulatory Visit: Payer: Self-pay | Admitting: Internal Medicine

## 2021-07-22 DIAGNOSIS — F5104 Psychophysiologic insomnia: Secondary | ICD-10-CM

## 2021-07-25 ENCOUNTER — Other Ambulatory Visit: Payer: Self-pay | Admitting: Internal Medicine

## 2021-07-25 DIAGNOSIS — F5104 Psychophysiologic insomnia: Secondary | ICD-10-CM

## 2021-07-25 NOTE — Telephone Encounter (Signed)
Rx 02/26/84 #[id number]- duplicate request Requested Prescriptions  Pending Prescriptions Disp Refills  . traZODone (DESYREL) 50 MG tablet [Pharmacy Med Name: TRAZODONE '50MG'$  TABLETS] 90 tablet 0    Sig: TAKE 1 TO 2 TABLETS(50 TO 100 MG) BY MOUTH AT BEDTIME AS NEEDED FOR SLEEP     Psychiatry: Antidepressants - Serotonin Modulator Passed - 07/25/2021  8:04 AM      Passed - Valid encounter within last 6 months    Recent Outpatient Visits          11 months ago Encounter for completion of form with patient   [facility], Coralie Keens, NP   1 year ago Encounter for general adult medical examination with abnormal findings   [facility] [place], Coralie Keens, NP   1 year ago Acute viral conjunctivitis of right eye   [facility] Kathrine Haddock, NP   1 year ago Right arm pain   [facility], Lupita Raider, FNP   1 year ago [place], FNP      Future Appointments            In 4 weeks Baity, Coralie Keens, NP [facility], [facility]

## 2021-08-04 ENCOUNTER — Other Ambulatory Visit: Payer: Self-pay | Admitting: Oncology

## 2021-08-12 ENCOUNTER — Ambulatory Visit
Admission: RE | Admit: 2021-08-12 | Discharge: 2021-08-12 | Disposition: A | Discharge status: Home / Self Care | Payer: Medicare Other | Source: Ambulatory Visit | Attending: Radiation Oncology | Admitting: Radiation Oncology

## 2021-08-12 VITALS — BP 116/84 | HR 85 | Temp 97.0°F | Resp 16 | Ht 67.0 in | Wt 135.0 lb

## 2021-08-12 DIAGNOSIS — Z79811 Long term (current) use of aromatase inhibitors: Secondary | ICD-10-CM | POA: Diagnosis not present

## 2021-08-12 DIAGNOSIS — Z170 Estrogen receptor positive status [ER+]: Secondary | ICD-10-CM | POA: Diagnosis not present

## 2021-08-12 DIAGNOSIS — D0511 Intraductal carcinoma in situ of right breast: Secondary | ICD-10-CM | POA: Diagnosis not present

## 2021-08-12 DIAGNOSIS — Z923 Personal history of irradiation: Secondary | ICD-10-CM | POA: Insufficient documentation

## 2021-08-12 DIAGNOSIS — Z08 Encounter for follow-up examination after completed treatment for malignant neoplasm: Secondary | ICD-10-CM | POA: Diagnosis not present

## 2021-08-12 NOTE — Progress Notes (Signed)
Radiation Oncology Follow up Note  Name: Tracie Reed   Date:   08/12/2021 MRN:  [id number] DOB: 05-06-1945    This 76 y.o. female presents to the clinic today for 45-monthfollow-up status post whole breast radiation to her right breast for ER positive ductal carcinoma in situ.  REFERRING PROVIDER: BJearld Fenton NP  HPI: Patient is a 76year old female now out 6 months having completed whole breast radiation to her right breast for ductal carcinoma in situ ER positive seen today in routine follow-up she is doing well she is putting out a little weight she is currently on.  Arimidex.  No other side effect from that medication.  She has not yet had a mammogram although it has been scheduled.  She specifically denies breast tenderness cough or bone pain.  COMPLICATIONS OF TREATMENT: none  FOLLOW UP COMPLIANCE: keeps appointments   PHYSICAL EXAM:  BP 116/84   Pulse 85   Temp (!) 97 F (36.1 C)   Resp 16   Ht '5\' 7"'$  (1.702 m)   Wt 135 lb (61.2 kg)   BMI 21.14 kg/m  Lungs are clear to A&P cardiac examination essentially unremarkable with regular rate and rhythm. No dominant mass or nodularity is noted in either breast in 2 positions examined. Incision is well-healed. No axillary or supraclavicular adenopathy is appreciated. Cosmetic result is excellent.  Well-developed well-nourished patient in NAD. HEENT reveals PERLA, EOMI, discs not visualized.  Oral cavity is clear. No oral mucosal lesions are identified. Neck is clear without evidence of cervical or supraclavicular adenopathy. Lungs are clear to A&P. Cardiac examination is essentially unremarkable with regular rate and rhythm without murmur rub or thrill. Abdomen is benign with no organomegaly or masses noted. Motor sensory and DTR levels are equal and symmetric in the upper and lower extremities. Cranial nerves II through XII are grossly intact. Proprioception is intact. No peripheral adenopathy or edema is identified. No motor or  sensory levels are noted. Crude visual fields are within normal range.  RADIOLOGY RESULTS: No current films for review  PLAN: Present time patient is doing well 6 months out from whole breast radiation with very little side effects.  On pleased with her overall progress.  She continues on Arimidex without side effect.  I have asked to see her back in 6 months for follow-up.  Patient knows to call with any concerns.  I would like to take this opportunity to thank you for allowing me to participate in the care of your patient..Noreene Filbert MD

## 2021-08-21 ENCOUNTER — Encounter: Payer: Medicare Other | Admitting: Internal Medicine

## 2021-08-22 ENCOUNTER — Other Ambulatory Visit: Payer: Medicare Other

## 2021-08-23 ENCOUNTER — Encounter: Payer: Self-pay | Admitting: Internal Medicine

## 2021-08-23 ENCOUNTER — Ambulatory Visit (INDEPENDENT_AMBULATORY_CARE_PROVIDER_SITE_OTHER): Payer: Medicare Other | Admitting: Internal Medicine

## 2021-08-23 VITALS — BP 138/88 | HR 88 | Temp 98.4°F | Ht 67.0 in | Wt 136.0 lb

## 2021-08-23 DIAGNOSIS — L309 Dermatitis, unspecified: Secondary | ICD-10-CM | POA: Insufficient documentation

## 2021-08-23 DIAGNOSIS — Z91013 Allergy to seafood: Secondary | ICD-10-CM

## 2021-08-23 DIAGNOSIS — L609 Nail disorder, unspecified: Secondary | ICD-10-CM

## 2021-08-23 DIAGNOSIS — N3941 Urge incontinence: Secondary | ICD-10-CM

## 2021-08-23 DIAGNOSIS — L308 Other specified dermatitis: Secondary | ICD-10-CM | POA: Diagnosis not present

## 2021-08-23 DIAGNOSIS — Z0001 Encounter for general adult medical examination with abnormal findings: Secondary | ICD-10-CM

## 2021-08-23 DIAGNOSIS — E7800 Pure hypercholesterolemia, unspecified: Secondary | ICD-10-CM | POA: Diagnosis not present

## 2021-08-23 MED ORDER — MOMETASONE FUROATE 0.1 % EX OINT: TOPICAL_OINTMENT | Freq: Every day | CUTANEOUS | 0 refills | Status: AC

## 2021-08-23 NOTE — Assessment & Plan Note (Signed)
Rx for mometasone cream twice daily.

## 2021-08-23 NOTE — Patient Instructions (Signed)
Health Maintenance for Postmenopausal Women Menopause is a normal process in which your ability to get pregnant comes to an end. This process happens slowly over many months or years, usually between the ages of 48 and 55. Menopause is complete when you have missed your menstrual period for 12 months. It is important to talk with your health care provider about some of the most common conditions that affect women after menopause (postmenopausal women). These include heart disease, cancer, and bone loss (osteoporosis). Adopting a healthy lifestyle and getting preventive care can help to promote your health and wellness. The actions you take can also lower your chances of developing some of these common conditions. What are the signs and symptoms of menopause? During menopause, you may have the following symptoms: Hot flashes. These can be moderate or severe. Night sweats. Decrease in sex drive. Mood swings. Headaches. Tiredness (fatigue). Irritability. Memory problems. Problems falling asleep or staying asleep. Talk with your health care provider about treatment options for your symptoms. Do I need hormone replacement therapy? Hormone replacement therapy is effective in treating symptoms that are caused by menopause, such as hot flashes and night sweats. Hormone replacement carries certain risks, especially as you become older. If you are thinking about using estrogen or estrogen with progestin, discuss the benefits and risks with your health care provider. How can I reduce my risk for heart disease and stroke? The risk of heart disease, heart attack, and stroke increases as you age. One of the causes may be a change in the body's hormones during menopause. This can affect how your body uses dietary fats, triglycerides, and cholesterol. Heart attack and stroke are medical emergencies. There are many things that you can do to help prevent heart disease and stroke. Watch your blood pressure High  blood pressure causes heart disease and increases the risk of stroke. This is more likely to develop in people who have high blood pressure readings or are overweight. Have your blood pressure checked: Every 3-5 years if you are 18-39 years of age. Every year if you are 40 years old or older. Eat a healthy diet  Eat a diet that includes plenty of vegetables, fruits, low-fat dairy products, and lean protein. Do not eat a lot of foods that are high in solid fats, added sugars, or sodium. Get regular exercise Get regular exercise. This is one of the most important things you can do for your health. Most adults should: Try to exercise for at least 150 minutes each week. The exercise should increase your heart rate and make you sweat (moderate-intensity exercise). Try to do strengthening exercises at least twice each week. Do these in addition to the moderate-intensity exercise. Spend less time sitting. Even light physical activity can be beneficial. Other tips Work with your health care provider to achieve or maintain a healthy weight. Do not use any products that contain nicotine or tobacco. These products include cigarettes, chewing tobacco, and vaping devices, such as e-cigarettes. If you need help quitting, ask your health care provider. Know your numbers. Ask your health care provider to check your cholesterol and your blood sugar (glucose). Continue to have your blood tested as directed by your health care provider. Do I need screening for cancer? Depending on your health history and family history, you may need to have cancer screenings at different stages of your life. This may include screening for: Breast cancer. Cervical cancer. Lung cancer. Colorectal cancer. What is my risk for osteoporosis? After menopause, you may be   at increased risk for osteoporosis. Osteoporosis is a condition in which bone destruction happens more quickly than new bone creation. To help prevent osteoporosis or  the bone fractures that can happen because of osteoporosis, you may take the following actions: If you are 19-50 years old, get at least 1,000 mg of calcium and at least 600 international units (IU) of vitamin D per day. If you are older than age 50 but younger than age 70, get at least 1,200 mg of calcium and at least 600 international units (IU) of vitamin D per day. If you are older than age 70, get at least 1,200 mg of calcium and at least 800 international units (IU) of vitamin D per day. Smoking and drinking excessive alcohol increase the risk of osteoporosis. Eat foods that are rich in calcium and vitamin D, and do weight-bearing exercises several times each week as directed by your health care provider. How does menopause affect my mental health? Depression may occur at any age, but it is more common as you become older. Common symptoms of depression include: Feeling depressed. Changes in sleep patterns. Changes in appetite or eating patterns. Feeling an overall lack of motivation or enjoyment of activities that you previously enjoyed. Frequent crying spells. Talk with your health care provider if you think that you are experiencing any of these symptoms. General instructions See your health care provider for regular wellness exams and vaccines. This may include: Scheduling regular health, dental, and eye exams. Getting and maintaining your vaccines. These include: Influenza vaccine. Get this vaccine each year before the flu season begins. Pneumonia vaccine. Shingles vaccine. Tetanus, diphtheria, and pertussis (Tdap) booster vaccine. Your health care provider may also recommend other immunizations. Tell your health care provider if you have ever been abused or do not feel safe at home. Summary Menopause is a normal process in which your ability to get pregnant comes to an end. This condition causes hot flashes, night sweats, decreased interest in sex, mood swings, headaches, or lack  of sleep. Treatment for this condition may include hormone replacement therapy. Take actions to keep yourself healthy, including exercising regularly, eating a healthy diet, watching your weight, and checking your blood pressure and blood sugar levels. Get screened for cancer and depression. Make sure that you are up to date with all your vaccines. This information is not intended to replace advice given to you by your health care provider. Make sure you discuss any questions you have with your health care provider. Document Revised: 06/04/2020 Document Reviewed: 06/04/2020 Elsevier Patient Education  2023 Elsevier Inc.  

## 2021-08-23 NOTE — Assessment & Plan Note (Signed)
Encourage timed voiding

## 2021-08-23 NOTE — Progress Notes (Signed)
Subjective:    Patient ID: Tracie Reed, female    DOB: 19-Mar-1945, 76 y.o.   MRN: [id number]  HPI  Patient presents to clinic today for her annual exam.  Flu: 09/2020 Tetanus: unsure COVID: Moderna x5 Pneumovax: 12/2018 Prevnar: 10/2020 Shingrix: 11/2019, 10/2020 Pap smear: No longer screening Mammogram: 04/2021, scheduled Bone density: 07/2019, scheduled Colon screening: 07/2020 Vision screening: annually Dentist: biannually  Diet: She does eat lean meat. She consumes fruits and veggies. She tries to avoid fried foods. She drinks mostly water and beer. Exercise: Stretches, weights  Review of Systems     Past Medical History:  Diagnosis Date   Anxiety    DCIS (ductal carcinoma in situ) of breast    History of cataract    Personal history of radiation therapy    Pre-diabetes    last A1C in 2021 was 5.2   Sleep apnea    had uvula removed    Current Outpatient Medications  Medication Sig Dispense Refill   acyclovir ointment (ZOVIRAX) 5 % Apply 1 application topically every 3 (three) hours as needed. (Patient taking differently: Apply 1 application. topically every 3 (three) hours as needed (cold sores).) 15 g 1   anastrozole (ARIMIDEX) 1 MG tablet TAKE 1 TABLET BY MOUTH EVERY DAY 90 tablet 1   CALCIUM-VITAMIN D PO Take 2 tablets by mouth daily.     cetirizine (ZYRTEC) 10 MG tablet TAKE 1 TABLET(10 MG) BY MOUTH DAILY 90 tablet 1   diclofenac Sodium (VOLTAREN) 1 % GEL Apply 1 application topically daily.     FLUoxetine (PROZAC) 10 MG tablet TAKE 1 TABLET(10 MG) BY MOUTH DAILY 90 tablet 1   ibuprofen (ADVIL) 800 MG tablet Take 1 tablet (800 mg total) by mouth every 8 (eight) hours as needed. 30 tablet 0   simvastatin (ZOCOR) 10 MG tablet TAKE 1 TABLET(10 MG) BY MOUTH AT BEDTIME 30 tablet 4   traZODone (DESYREL) 50 MG tablet TAKE 1 TO 2 TABLETS(50 TO 100 MG) BY MOUTH AT BEDTIME AS NEEDED FOR SLEEP 90 tablet 0   valACYclovir (VALTREX) 1000 MG tablet Take 2 tablets (2,000  mg) at the start of your cold sore.  Take 1 tablet (1,000 mg) daily until sores have resolved. 15 tablet 1   No current facility-administered medications for this visit.    No Known Allergies  Family History  Problem Relation Age of Onset   Breast cancer Mother    Diabetes Mother    Depression Mother    Healthy Brother     Social History   Socioeconomic History   Marital status: Significant Other    Spouse name: Not on file   Number of children: 2   Years of education: Not on file   Highest education level: Bachelor's degree (e.g., BA, AB, BS)  Occupational History   Occupation: self employed    Comment: Holiday representative  Tobacco Use   Smoking status: Former    Packs/day: 0.50    Years: 5.00    Total pack years: 2.50    Types: Cigarettes    Quit date: 11/12/1987    Years since quitting: 33.8   Smokeless tobacco: Never  Vaping Use   Vaping Use: Never used  Substance and Sexual Activity   Alcohol use: Yes    Comment: occ beer once a week   Drug use: Not Currently    Comment: past marijuana - very remote use   Sexual activity: Yes  Other Topics Concern  Not on file  Social History Narrative   Not on file   Social Determinants of Health   Financial Resource Strain: Low Risk  (03/14/2021)   Overall Financial Resource Strain (CARDIA)    Difficulty of Paying Living Expenses: Not hard at all  Food Insecurity: No Food Insecurity (03/14/2021)   Hunger Vital Sign    Worried About Running Out of Food in the Last Year: Never true    Ran Out of Food in the Last Year: Never true  Transportation Needs: No Transportation Needs (03/14/2021)   PRAPARE - Hydrologist (Medical): No    Lack of Transportation (Non-Medical): No  Physical Activity: Sufficiently Active (03/14/2021)   Exercise Vital Sign    Days of Exercise per Week: 7 days    Minutes of Exercise per Session: 30 min  Stress: No Stress Concern Present (03/14/2021)    [place]    Feeling of Stress : Not at all  Social Connections: Moderately Isolated (03/14/2021)   Social Connection and Isolation Panel [NHANES]    Frequency of Communication with Friends and Family: More than three times a week    Frequency of Social Gatherings with Friends and Family: More than three times a week    Attends Religious Services: Never    Marine scientist or Organizations: No    Attends Archivist Meetings: Never    Marital Status: Living with partner  Intimate Partner Violence: Not At Risk (12/21/2018)   Humiliation, Afraid, Rape, and Kick questionnaire    Fear of Current or Ex-Partner: No    Emotionally Abused: No    Physically Abused: No    Sexually Abused: No     Constitutional: Denies fever, malaise, fatigue, headache or abrupt weight changes.  HEENT: Denies eye pain, eye redness, ear pain, ringing in the ears, wax buildup, runny nose, nasal congestion, bloody nose, or sore throat. Respiratory: Denies difficulty breathing, shortness of breath, cough or sputum production.   Cardiovascular: Denies chest pain, chest tightness, palpitations or swelling in the hands or feet.  Gastrointestinal: Denies abdominal pain, bloating, constipation, diarrhea or blood in the stool.  GU: Pt reports urge incontinence. Denies frequency, pain with urination, burning sensation, blood in urine, odor or discharge. Musculoskeletal: Denies decrease in range of motion, difficulty with gait, muscle pain or joint pain and swelling.  Skin: Patient reports rash of left ring finger and right foot.  Denies lesions or ulcercations.  Neurological: Patient reports insomnia.  Denies dizziness, difficulty with memory, difficulty with speech or problems with balance and coordination.  Psych: Patient has a history of anxiety.  Denies depression, SI/HI.  No other specific complaints in a complete review of systems (except  as listed in HPI above).  Objective:   Physical Exam BP 138/88 (BP Location: Left Arm, Patient Position: Sitting, Cuff Size: Normal)   Pulse 88   Temp 98.4 F (36.9 C) (Temporal)   Ht _0  (1.702 m)   Wt 136 lb (61.7 kg)   SpO2 96%   BMI 21.30 kg/m   Wt Readings from Last 3 Encounters:  08/12/21 135 lb (61.2 kg)  05/21/21 132 lb 3.2 oz (60 kg)  03/14/21 133 lb 9.6 oz (60.6 kg)    General: Appears her stated age, well developed, well nourished in NAD. Skin: Warm, dry and intact.  HEENT: Head: normal shape and size; Eyes: sclera white, no icterus, conjunctiva pink, PERRLA and EOMs intact;  Neck:  Neck supple, trachea midline. No masses, lumps or thyromegaly present.  Cardiovascular: Normal rate and rhythm. S1,S2 noted.  No murmur, rubs or gallops noted. No JVD or BLE edema. No carotid bruits noted. Pulmonary/Chest: Normal effort and positive vesicular breath sounds. No respiratory distress. No wheezes, rales or ronchi noted.  Abdomen: Soft and nontender. Normal bowel sounds. No distention or masses noted.  Musculoskeletal:Strength 5/5 BUE/BLE. No difficulty with gait.  Neurological: Alert and oriented. Cranial nerves II-XII grossly intact. Coordination normal.  Psychiatric: Mood and affect normal. Behavior is normal. Judgment and thought content normal.    BMET    Component Value Date/Time   NA 139 10/09/2020 1143   K 4.1 10/09/2020 1143   CL 103 10/09/2020 1143   CO2 28 10/09/2020 1143   GLUCOSE 104 (H) 10/09/2020 1143   BUN 8 10/09/2020 1143   CREATININE 0.52 10/09/2020 1143   CREATININE 0.67 07/19/2020 1026   CALCIUM 9.1 10/09/2020 1143   GFRNONAA >60 10/09/2020 1143   GFRNONAA 86 07/19/2020 1026   GFRAA 100 07/19/2020 1026    Lipid Panel     Component Value Date/Time   CHOL 230 (H) 07/19/2020 1026   TRIG 91 07/19/2020 1026   HDL 59 07/19/2020 1026   CHOLHDL 3.9 07/19/2020 1026   LDLCALC 151 (H) 07/19/2020 1026    CBC    Component Value Date/Time   WBC  5.3 01/09/2021 1048   RBC 4.22 01/09/2021 1048   HGB 12.9 01/09/2021 1048   HCT 38.7 01/09/2021 1048   PLT 209 01/09/2021 1048   MCV 91.7 01/09/2021 1048   MCH 30.6 01/09/2021 1048   MCHC 33.3 01/09/2021 1048   RDW 14.3 01/09/2021 1048   LYMPHSABS 2.0 10/09/2020 1143   MONOABS 0.5 10/09/2020 1143   EOSABS 0.1 10/09/2020 1143   BASOSABS 0.0 10/09/2020 1143    Hgb A1C Lab Results  Component Value Date   HGBA1C 5.2 07/05/2019           Assessment & Plan:   Preventative Health Maintenance:  Encouraged her to get a flu shot in the fall She declines tetanus for financial reasons, advised if she gets bit or cut to get this done Pneumovax and Prevnar UTD Shingrix UTD COVID-vaccine UTD She no longer needs Pap smears Mammogram and bone density scheduled Colon screening UTD Encouraged her to consume a balanced diet and exercise regimen Advised her to see an eye doctor and dentist annually We will check CBC, c-Met and lipid profile today  Shellfish Allergy:  Shellfish allergy profile today  Nail Problem:  Referral to podiatry for further evaluation and treatment  RTC in 6 months, follow-up chronic conditions Webb Silversmith, NP

## 2021-08-26 LAB — LIPID PANEL
Cholesterol: 175 mg/dL (ref <200)
HDL: 81 mg/dL (ref >=50)
LDL Cholesterol (Calc): 76 mg/dL (calc)
Non-HDL Cholesterol (Calc): 94 mg/dL (calc) (ref <130)
Total CHOL/HDL Ratio: 2.2 (calc) (ref <5.0)
Triglycerides: 95 mg/dL (ref <150)

## 2021-08-26 LAB — CBC
HCT: 40.7 % (ref 35.0–45.0)
Hemoglobin: 13.8 g/dL (ref 11.7–15.5)
MCH: 30.9 pg (ref 27.0–33.0)
MCHC: 33.9 g/dL (ref 32.0–36.0)
MCV: 91.3 fL (ref 80.0–100.0)
MPV: 10.5 fL (ref 7.5–12.5)
Platelets: 227 10*3/uL (ref 140–400)
RBC: 4.46 10*6/uL (ref 3.80–5.10)
RDW: 13.3 % (ref 11.0–15.0)
WBC: 7 10*3/uL (ref 3.8–10.8)

## 2021-08-26 LAB — COMPLETE METABOLIC PANEL WITH GFR
AG Ratio: 2.5 (calc) (ref 1.0–2.5)
ALT: 17 U/L (ref 6–29)
AST: 21 U/L (ref 10–35)
Albumin: 4.8 g/dL (ref 3.6–5.1)
Alkaline phosphatase (APISO): 57 U/L (ref 37–153)
BUN: 8 mg/dL (ref 7–25)
CO2: 25 mmol/L (ref 20–32)
Calcium: 9.9 mg/dL (ref 8.6–10.4)
Chloride: 104 mmol/L (ref 98–110)
Creat: 0.64 mg/dL (ref 0.60–1.00)
Globulin: 1.9 g/dL (calc) (ref 1.9–3.7)
Glucose, Bld: 97 mg/dL (ref 65–139)
Potassium: 4 mmol/L (ref 3.5–5.3)
Sodium: 139 mmol/L (ref 135–146)
Total Bilirubin: 0.6 mg/dL (ref 0.2–1.2)
Total Protein: 6.7 g/dL (ref 6.1–8.1)
eGFR: 92 mL/min/{1.73_m2} (ref >=60)

## 2021-08-26 LAB — ALLERGY-SHELLFISH PANEL
CLASS: 0
CLASS: 0
CLASS: 0
Clams: <0.1 kU/L
Class: 0
Crab: <0.1 kU/L
Lobster: <0.1 kU/L
Shrimp IgE: <0.1 kU/L

## 2021-08-26 LAB — INTERPRETATION:

## 2021-08-27 ENCOUNTER — Other Ambulatory Visit: Payer: Self-pay | Admitting: Internal Medicine

## 2021-08-27 ENCOUNTER — Ambulatory Visit
Admission: RE | Admit: 2021-08-27 | Discharge: 2021-08-27 | Disposition: A | Discharge status: Home / Self Care | Payer: Medicare Other | Source: Ambulatory Visit | Attending: Internal Medicine | Admitting: Internal Medicine

## 2021-08-27 ENCOUNTER — Ambulatory Visit
Admission: RE | Admit: 2021-08-27 | Discharge: 2021-08-27 | Disposition: A | Discharge status: Home / Self Care | Payer: Medicare Other | Source: Ambulatory Visit | Attending: Oncology | Admitting: Oncology

## 2021-08-27 DIAGNOSIS — Z780 Asymptomatic menopausal state: Secondary | ICD-10-CM | POA: Diagnosis not present

## 2021-08-27 DIAGNOSIS — Z79899 Other long term (current) drug therapy: Secondary | ICD-10-CM

## 2021-08-27 DIAGNOSIS — C50411 Malignant neoplasm of upper-outer quadrant of right female breast: Secondary | ICD-10-CM

## 2021-08-27 DIAGNOSIS — M85852 Other specified disorders of bone density and structure, left thigh: Secondary | ICD-10-CM | POA: Diagnosis not present

## 2021-08-27 DIAGNOSIS — M85832 Other specified disorders of bone density and structure, left forearm: Secondary | ICD-10-CM | POA: Diagnosis not present

## 2021-08-27 DIAGNOSIS — Z1231 Encounter for screening mammogram for malignant neoplasm of breast: Secondary | ICD-10-CM | POA: Diagnosis not present

## 2021-08-27 DIAGNOSIS — D0511 Intraductal carcinoma in situ of right breast: Secondary | ICD-10-CM | POA: Insufficient documentation

## 2021-08-27 DIAGNOSIS — R922 Inconclusive mammogram: Secondary | ICD-10-CM | POA: Diagnosis not present

## 2021-08-28 ENCOUNTER — Encounter: Payer: Self-pay | Admitting: Internal Medicine

## 2021-09-03 ENCOUNTER — Ambulatory Visit: Payer: Medicare Other | Admitting: Surgery

## 2021-09-03 ENCOUNTER — Encounter: Payer: Self-pay | Admitting: Surgery

## 2021-09-03 VITALS — BP 146/83 | HR 83 | Temp 98.0°F | Ht 67.0 in | Wt 137.0 lb

## 2021-09-03 DIAGNOSIS — D0511 Intraductal carcinoma in situ of right breast: Secondary | ICD-10-CM

## 2021-09-03 NOTE — Patient Instructions (Addendum)
The patient has been asked to return to the office in one year with a bilateral diagnostic mammogram.  We will send you a letter about these appointments.       Please call and ask to speak with a nurse if you develop questions or concerns.

## 2021-09-03 NOTE — Progress Notes (Signed)
Surgical Clinic Progress/Follow-up Note   HPI:  76 y.o. Female presents to clinic for DCIS right breast follow-up.  About 4 months follow the last evaluation.   She has completed her radiation.  She continues to take her Arimidex.  She reports she is tolerating it well. Patient reports  improvement/resolution of prior scar issues.   Review of Systems:  Constitutional: denies fever/chills  ENT: denies sore throat, hearing problems  Respiratory: denies shortness of breath, wheezing  Cardiovascular: denies chest pain, palpitations  Gastrointestinal: denies abdominal pain, N/V, or diarrhea/and bowel function as per interval history Skin: Denies any other rashes or skin discolorations except post-surgical wounds as per interval history  Vital Signs:  BP (!) 146/83   Pulse 83   Temp 98 F (36.7 C)   Ht '5\' 7"'$  (1.702 m)   Wt 137 lb (62.1 kg)   SpO2 98%   BMI 21.46 kg/m    Physical Exam:  Constitutional:  -- Normal body habitus  -- Awake, alert, and oriented x3  Pulmonary:  -- Breathing non-labored at rest Cardiovascular:  -- Regular rate. Gastrointestinal:  -- Soft and non-distended, non-tender GU  --Caryl Lyn present as chaperone.  Right breast evaluated, lateral scar appears to be well-healed.  Minimal radiation changes appreciated.  No other suspicious, nor dominant nodularity present.  No appreciable skin changes.  The left breast remains unremarkable, no nodularity appreciable. Musculoskeletal / Integumentary:  -- Wounds or skin discoloration: None appreciated except post-surgical incisions as described above -- Extremities: B/L UE and LE FROM, hands and feet warm, no edema   Imaging:  CLINICAL DATA:  RIGHT lumpectomy in September 2022 for DCIS, intermediate and low grade.   EXAM: DIGITAL DIAGNOSTIC BILATERAL MAMMOGRAM WITH TOMOSYNTHESIS   TECHNIQUE: Bilateral digital diagnostic mammography and breast tomosynthesis was performed.   COMPARISON:  Previous exam(s).    ACR Breast Density Category c: The breast tissue is heterogeneously dense, which may obscure small masses.   FINDINGS: There is density and architectural distortion within the RIGHT breast, consistent with postsurgical changes. These are stable in comparison to prior. No suspicious mass, distortion, or microcalcifications are identified to suggest presence of malignancy.   IMPRESSION: No mammographic evidence malignancy bilaterally.   RECOMMENDATION: Diagnostic mammogram is suggested in 1 year. (Code:DM-B-01Y)   I have discussed the findings and recommendations with the patient. If applicable, a reminder letter will be sent to the patient regarding the next appointment.   BI-RADS CATEGORY  2: Benign.     Electronically Signed   By: Valentino Saxon M.D.   On: 08/27/2021 11:16  Assessment:  76 y.o. yo Female with a problem list including...  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  Plan:              - return to clinic in 12 months or as needed, instructed to call office if any questions or concerns.    It appears her next set of imaging will be diagnostic, bilateral in August of 2024.  All of the above recommendations were discussed with the patient, and all of patient'squestions were answered to her expressed satisfaction.  These notes generated with voice recognition software.  I apologize for typographical errors.  Ronny Bacon, MD, FACS [place]: [facility]. Office: [phone]

## 2021-09-10 ENCOUNTER — Ambulatory Visit: Payer: Medicare Other | Admitting: Podiatry

## 2021-09-10 DIAGNOSIS — B351 Tinea unguium: Secondary | ICD-10-CM

## 2021-09-10 DIAGNOSIS — M79674 Pain in right toe(s): Secondary | ICD-10-CM

## 2021-09-10 DIAGNOSIS — M79675 Pain in left toe(s): Secondary | ICD-10-CM | POA: Diagnosis not present

## 2021-09-10 NOTE — Progress Notes (Signed)
   Chief Complaint  Patient presents with   foot care    Routine foot care.    SUBJECTIVE Patient presents to office today complaining of elongated, thickened nails that cause pain while ambulating in shoes.  Patient is unable to trim their own nails. Patient is here for further evaluation and treatment.  Past Medical History:  Diagnosis Date   Anxiety    DCIS (ductal carcinoma in situ) of breast    History of cataract    Personal history of radiation therapy    Pre-diabetes    last A1C in 2021 was 5.2   Sleep apnea    had uvula removed    OBJECTIVE General Patient is awake, alert, and oriented x 3 and in no acute distress. Derm Skin is dry and supple bilateral. Negative open lesions or macerations. Remaining integument unremarkable. Nails are tender, long, thickened and dystrophic with subungual debris, consistent with onychomycosis, 1-5 bilateral. No signs of infection noted. Vasc  DP and PT pedal pulses palpable bilaterally. Temperature gradient within normal limits.  Neuro Epicritic and protective threshold sensation grossly intact bilaterally.  Musculoskeletal Exam No symptomatic pedal deformities noted bilateral. Muscular strength within normal limits.  ASSESSMENT 1.  Pain due to onychomycosis of toenails both  PLAN OF CARE 1. Patient evaluated today.  2. Instructed to maintain good pedal hygiene and foot care.  3. Mechanical debridement of nails 1-5 bilaterally performed using a nail nipper. Filed with dremel without incident.  4. Return to clinic in 3 mos.    Edrick Kins, DPM [facility]  Dr. Edrick Kins, DPM    [address], [place] [address]                Office [phone]  Fax [phone]

## 2021-09-11 ENCOUNTER — Other Ambulatory Visit: Payer: Self-pay | Admitting: Internal Medicine

## 2021-09-11 DIAGNOSIS — R059 Cough, unspecified: Secondary | ICD-10-CM

## 2021-09-11 NOTE — Telephone Encounter (Signed)
Requested Prescriptions  Pending Prescriptions Disp Refills  . cetirizine (ZYRTEC) 10 MG tablet [Pharmacy Med Name: CETIRIZINE '10MG'$  TABLETS] 90 tablet 1    Sig: TAKE 1 TABLET(10 MG) BY MOUTH DAILY     Ear, Nose, and Throat:  Antihistamines 2 Passed - 09/11/2021  3:45 AM      Passed - Cr in normal range and within 360 days    Creat  Date Value Ref Range Status  08/23/2021 0.64 0.60 - 1.00 mg/dL Final         Passed - Valid encounter within last 12 months    Recent Outpatient Visits          2 weeks ago Encounter for general adult medical examination with abnormal findings   [facility] [place], Coralie Keens, NP   1 year ago Encounter for completion of form with patient   [facility], Coralie Keens, NP   1 year ago Encounter for general adult medical examination with abnormal findings   [facility] [place], Coralie Keens, NP   1 year ago Acute viral conjunctivitis of right eye   [place], NP   1 year ago Right arm pain   [facility] [place], Lupita Raider, [place]

## 2021-09-12 ENCOUNTER — Other Ambulatory Visit: Payer: Self-pay | Admitting: Internal Medicine

## 2021-09-12 DIAGNOSIS — F5104 Psychophysiologic insomnia: Secondary | ICD-10-CM

## 2021-09-13 NOTE — Telephone Encounter (Signed)
Requested Prescriptions  Pending Prescriptions Disp Refills  . traZODone (DESYREL) 50 MG tablet [Pharmacy Med Name: TRAZODONE '50MG'$  TABLETS] 90 tablet 0    Sig: TAKE 1 TO 2 TABLETS(50 TO 100 MG) BY MOUTH AT BEDTIME AS NEEDED FOR SLEEP     Psychiatry: Antidepressants - Serotonin Modulator Passed - 09/12/2021 10:03 AM      Passed - Valid encounter within last 6 months    Recent Outpatient Visits          3 weeks ago Encounter for general adult medical examination with abnormal findings   [facility] [place], Coralie Keens, NP   1 year ago Encounter for completion of form with patient   [facility], Coralie Keens, NP   1 year ago Encounter for general adult medical examination with abnormal findings   [facility], Coralie Keens, NP   1 year ago Acute viral conjunctivitis of right eye   [place], NP   1 year ago Right arm pain   [facility] [place], Lupita Raider, [place]

## 2021-09-14 ENCOUNTER — Other Ambulatory Visit: Payer: Self-pay | Admitting: Internal Medicine

## 2021-09-14 DIAGNOSIS — F5104 Psychophysiologic insomnia: Secondary | ICD-10-CM

## 2021-09-16 NOTE — Telephone Encounter (Signed)
reordered 09/13/21 by Webb Silversmith NP and sent to requested pharmacy  Requested Prescriptions  Refused Prescriptions Disp Refills  . traZODone (DESYREL) 50 MG tablet [Pharmacy Med Name: TRAZODONE '50MG'$  TABLETS] 90 tablet 0    Sig: TAKE 1 TO 2 TABLETS(50 TO 100 MG) BY MOUTH AT BEDTIME AS NEEDED FOR SLEEP     Psychiatry: Antidepressants - Serotonin Modulator Passed - 09/14/2021  8:04 AM      Passed - Valid encounter within last 6 months    Recent Outpatient Visits          3 weeks ago Encounter for general adult medical examination with abnormal findings   [facility] [place], Coralie Keens, NP   1 year ago Encounter for completion of form with patient   [facility], Coralie Keens, NP   1 year ago Encounter for general adult medical examination with abnormal findings   [facility] [place], Coralie Keens, NP   1 year ago Acute viral conjunctivitis of right eye   [place], NP   1 year ago Right arm pain   [facility] [place], Lupita Raider, [place]

## 2021-10-17 ENCOUNTER — Other Ambulatory Visit: Payer: Self-pay | Admitting: Internal Medicine

## 2021-10-17 NOTE — Telephone Encounter (Signed)
Requested Prescriptions  Pending Prescriptions Disp Refills  . simvastatin (ZOCOR) 10 MG tablet [Pharmacy Med Name: SIMVASTATIN '10MG'$  TABLETS] 90 tablet 2    Sig: TAKE 1 TABLET(10 MG) BY MOUTH AT BEDTIME     Cardiovascular:  Antilipid - Statins Failed - 10/17/2021  8:04 AM      Failed - Lipid Panel in normal range within the last 12 months    Cholesterol  Date Value Ref Range Status  08/23/2021 175 <200 mg/dL Final   LDL Cholesterol (Calc)  Date Value Ref Range Status  08/23/2021 76 mg/dL (calc) Final    Comment:    Reference range: <100 . Desirable range <100 mg/dL for primary prevention;   <70 mg/dL for patients with CHD or diabetic patients  with > or = 2 CHD risk factors. Marland Kitchen LDL-C is now calculated using the Martin-Hopkins  calculation, which is a validated novel method providing  better accuracy than the Friedewald equation in the  estimation of LDL-C.  Cresenciano Genre et al. Annamaria Helling. 6160;737(10): 2061-2068  (http://education.[employer].com/faq/FAQ164)    HDL  Date Value Ref Range Status  08/23/2021 81 > OR = 50 mg/dL Final   Triglycerides  Date Value Ref Range Status  08/23/2021 95 <150 mg/dL Final         Passed - Patient is not pregnant      Passed - Valid encounter within last 12 months    Recent Outpatient Visits          1 month ago Encounter for general adult medical examination with abnormal findings   [facility] [place], Coralie Keens, NP   1 year ago Encounter for completion of form with patient   [facility], Coralie Keens, NP   1 year ago Encounter for general adult medical examination with abnormal findings   [facility], Coralie Keens, NP   1 year ago Acute viral conjunctivitis of right eye   [place], NP   1 year ago Right arm pain   [facility] [place], Lupita Raider, [place]

## 2021-10-29 ENCOUNTER — Other Ambulatory Visit: Payer: Self-pay | Admitting: Internal Medicine

## 2021-10-29 DIAGNOSIS — F5104 Psychophysiologic insomnia: Secondary | ICD-10-CM

## 2021-10-30 NOTE — Telephone Encounter (Signed)
Requested Prescriptions  Pending Prescriptions Disp Refills  . traZODone (DESYREL) 50 MG tablet [Pharmacy Med Name: TRAZODONE '50MG'$  TABLETS] 90 tablet 0    Sig: TAKE 1 TO 2 TABLETS(50 TO 100 MG) BY MOUTH AT BEDTIME AS NEEDED FOR SLEEP     Psychiatry: Antidepressants - Serotonin Modulator Passed - 10/29/2021  7:50 PM      Passed - Valid encounter within last 6 months    Recent Outpatient Visits          2 months ago Encounter for general adult medical examination with abnormal findings   [facility] [place], Coralie Keens, NP   1 year ago Encounter for completion of form with patient   [facility], Coralie Keens, NP   1 year ago Encounter for general adult medical examination with abnormal findings   [facility] [place], Coralie Keens, NP   1 year ago Acute viral conjunctivitis of right eye   [place], NP   1 year ago Right arm pain   [facility] [place], Lupita Raider, [place]

## 2021-10-31 ENCOUNTER — Other Ambulatory Visit: Payer: Self-pay | Admitting: Internal Medicine

## 2021-10-31 DIAGNOSIS — F5104 Psychophysiologic insomnia: Secondary | ICD-10-CM

## 2021-10-31 NOTE — Telephone Encounter (Signed)
Rx 63/8/17 #71- duplicate request Requested Prescriptions  Pending Prescriptions Disp Refills  . traZODone (DESYREL) 50 MG tablet [Pharmacy Med Name: TRAZODONE '50MG'$  TABLETS] 90 tablet 0    Sig: TAKE 1 TO 2 TABLETS(50 TO 100 MG) BY MOUTH AT BEDTIME AS NEEDED FOR SLEEP     Psychiatry: Antidepressants - Serotonin Modulator Passed - 10/31/2021  8:04 AM      Passed - Valid encounter within last 6 months    Recent Outpatient Visits          2 months ago Encounter for general adult medical examination with abnormal findings   [facility] [place], Coralie Keens, NP   1 year ago Encounter for completion of form with patient   [facility], Coralie Keens, NP   1 year ago Encounter for general adult medical examination with abnormal findings   [facility] [place], Coralie Keens, NP   1 year ago Acute viral conjunctivitis of right eye   [place], NP   1 year ago Right arm pain   [facility] [place], Lupita Raider, [place]

## 2021-11-08 ENCOUNTER — Encounter: Payer: Self-pay | Admitting: Oncology

## 2021-11-08 ENCOUNTER — Inpatient Hospital Stay: Payer: Medicare Other | Attending: Oncology | Admitting: Oncology

## 2021-11-08 VITALS — BP 122/86 | HR 76 | Temp 99.0°F | Resp 16 | Ht 67.0 in | Wt 147.8 lb

## 2021-11-08 DIAGNOSIS — Z79624 Long term (current) use of inhibitors of nucleotide synthesis: Secondary | ICD-10-CM | POA: Insufficient documentation

## 2021-11-08 DIAGNOSIS — Z5181 Encounter for therapeutic drug level monitoring: Secondary | ICD-10-CM

## 2021-11-08 DIAGNOSIS — Z86000 Personal history of in-situ neoplasm of breast: Secondary | ICD-10-CM

## 2021-11-08 DIAGNOSIS — Z9049 Acquired absence of other specified parts of digestive tract: Secondary | ICD-10-CM | POA: Diagnosis not present

## 2021-11-08 DIAGNOSIS — Z87891 Personal history of nicotine dependence: Secondary | ICD-10-CM | POA: Diagnosis not present

## 2021-11-08 DIAGNOSIS — Z79899 Other long term (current) drug therapy: Secondary | ICD-10-CM | POA: Insufficient documentation

## 2021-11-08 DIAGNOSIS — Z79811 Long term (current) use of aromatase inhibitors: Secondary | ICD-10-CM | POA: Insufficient documentation

## 2021-11-08 DIAGNOSIS — Z170 Estrogen receptor positive status [ER+]: Secondary | ICD-10-CM | POA: Insufficient documentation

## 2021-11-08 DIAGNOSIS — D0511 Intraductal carcinoma in situ of right breast: Secondary | ICD-10-CM | POA: Diagnosis not present

## 2021-11-08 DIAGNOSIS — Z818 Family history of other mental and behavioral disorders: Secondary | ICD-10-CM | POA: Diagnosis not present

## 2021-11-08 DIAGNOSIS — Z833 Family history of diabetes mellitus: Secondary | ICD-10-CM | POA: Insufficient documentation

## 2021-11-08 DIAGNOSIS — M85852 Other specified disorders of bone density and structure, left thigh: Secondary | ICD-10-CM | POA: Insufficient documentation

## 2021-11-08 DIAGNOSIS — Z803 Family history of malignant neoplasm of breast: Secondary | ICD-10-CM | POA: Diagnosis not present

## 2021-11-08 DIAGNOSIS — Z923 Personal history of irradiation: Secondary | ICD-10-CM | POA: Diagnosis not present

## 2021-11-08 DIAGNOSIS — Z08 Encounter for follow-up examination after completed treatment for malignant neoplasm: Secondary | ICD-10-CM

## 2021-11-08 DIAGNOSIS — M2550 Pain in unspecified joint: Secondary | ICD-10-CM | POA: Insufficient documentation

## 2021-11-10 NOTE — Progress Notes (Signed)
Hematology/Oncology Consult note [facility]  [phone] [phone]  Patient Care Team: Jearld Fenton, NP as PCP - General (Internal Medicine) Rico Junker, RN as Oncology Nurse Navigator Sindy Guadeloupe, MD as Consulting Physician (Oncology) Noreene Filbert, MD as Consulting Physician (Radiation Oncology) Ronny Bacon, MD as Consulting Physician (General Surgery)   Name of the patient: Tracie Reed  [id number]  1945/10/06   Date of visit: 11/10/21  Diagnosis-ER positive DCIS  Chief complaint/ Reason for visit-routine follow-up of ER positive DCIS  Heme/Onc history: Patient is a 76 year old female who was 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  There was an additional medial margin that was excised which showed an isolated focus of intermediate grade DCIS.  On biopsy the size of DCIS was 4 mm.  Margins negative.  ER greater than 90% positive.  Patient underwent postlumpectomy radiation treatment.  She did start on Arimidex in January 2023, stopped it briefly and then restarted in July 2023.  Interval history-she is doing well overall and denies any breast concerns at this time.  Tolerating Arimidex well and other than occasional arthralgia she denies other complaints at this time  ECOG PS- 1 Pain scale- 0   Review of systems- Review of Systems  Constitutional:  Negative for chills, fever, malaise/fatigue and weight loss.  HENT:  Negative for congestion, ear discharge and nosebleeds.   Eyes:  Negative for blurred vision.  Respiratory:  Negative for cough, hemoptysis, sputum production, shortness of breath and wheezing.   Cardiovascular:  Negative for chest pain, palpitations, orthopnea and claudication.  Gastrointestinal:  Negative for abdominal pain, blood in stool, constipation, diarrhea, heartburn, melena, nausea and  vomiting.  Genitourinary:  Negative for dysuria, flank pain, frequency, hematuria and urgency.  Musculoskeletal:  Negative for back pain, joint pain and myalgias.  Skin:  Negative for rash.  Neurological:  Negative for dizziness, tingling, focal weakness, seizures, weakness and headaches.  Endo/Heme/Allergies:  Does not bruise/bleed easily.  Psychiatric/Behavioral:  Negative for depression and suicidal ideas. The patient does not have insomnia.       No Known Allergies   Past Medical History:  Diagnosis Date   Anxiety    Breast cancer ([place])    DCIS (ductal carcinoma in situ) of breast    History of cataract    Personal history of radiation therapy    Pre-diabetes    last A1C in 2021 was 5.2   Sleep apnea    had uvula removed     Past Surgical History:  Procedure Laterality Date   BREAST BIOPSY Right 09/14/2020   Affirm bx-calcs, "Ribbon" marker-path pending   BREAST EXCISIONAL BIOPSY Right 1993   neg   BREAST LUMPECTOMY Right 10/12/2020   BREAST LUMPECTOMY WITH RADIOFREQUENCY TAG IDENTIFICATION Right 10/12/2020   Procedure: BREAST LUMPECTOMY WITH RADIOFREQUENCY TAG IDENTIFICATION;  Surgeon: Ronny Bacon, MD;  Location: [facility] ORS;  Service: General;  Laterality: Right;   BUNIONECTOMY Bilateral    CATARACT EXTRACTION  09/03/2020   CHOLECYSTECTOMY     COLONOSCOPY WITH PROPOFOL N/A 07/31/2020   Procedure: COLONOSCOPY WITH PROPOFOL;  Surgeon: Lucilla Lame, MD;  Location: [facility] ENDOSCOPY;  Service: Endoscopy;  Laterality: N/A;   IRIDOTOMY / IRIDECTOMY Right    UVULECTOMY      Social History   Socioeconomic History   Marital status: Significant Other    Spouse name: Not on file   Number  of children: 2   Years of education: Not on file   Highest education level: Bachelor's degree (e.g., BA, AB, BS)  Occupational History   Occupation: self employed    Comment: Holiday representative  Tobacco Use   Smoking status: Former    Packs/day: 0.50    Years:  5.00    Total pack years: 2.50    Types: Cigarettes    Quit date: 11/12/1987    Years since quitting: 34.0    Passive exposure: Past   Smokeless tobacco: Never  Vaping Use   Vaping Use: Never used  Substance and Sexual Activity   Alcohol use: Yes    Comment: 2 pints on weekend beer   Drug use: Not Currently    Comment: past marijuana - very remote use   Sexual activity: Yes  Other Topics Concern   Not on file  Social History Narrative   Not on file   Social Determinants of Health   Financial Resource Strain: Low Risk  (03/14/2021)   Overall Financial Resource Strain (CARDIA)    Difficulty of Paying Living Expenses: Not hard at all  Food Insecurity: No Food Insecurity (03/14/2021)   Hunger Vital Sign    Worried About Running Out of Food in the Last Year: Never true    Ran Out of Food in the Last Year: Never true  Transportation Needs: No Transportation Needs (03/14/2021)   PRAPARE - Hydrologist (Medical): No    Lack of Transportation (Non-Medical): No  Physical Activity: Sufficiently Active (03/14/2021)   Exercise Vital Sign    Days of Exercise per Week: 7 days    Minutes of Exercise per Session: 30 min  Stress: No Stress Concern Present (03/14/2021)   [place]    Feeling of Stress : Not at all  Social Connections: Moderately Isolated (03/14/2021)   Social Connection and Isolation Panel [NHANES]    Frequency of Communication with Friends and Family: More than three times a week    Frequency of Social Gatherings with Friends and Family: More than three times a week    Attends Religious Services: Never    Marine scientist or Organizations: No    Attends Archivist Meetings: Never    Marital Status: Living with partner  Intimate Partner Violence: Not At Risk (12/21/2018)   Humiliation, Afraid, Rape, and Kick questionnaire    Fear of Current or Ex-Partner: No     Emotionally Abused: No    Physically Abused: No    Sexually Abused: No    Family History  Problem Relation Age of Onset   Breast cancer Mother    Diabetes Mother    Depression Mother    Healthy Brother      Current Outpatient Medications:    acyclovir ointment (ZOVIRAX) 5 %, Apply 1 application topically every 3 (three) hours as needed. (Patient taking differently: Apply 1 application  topically every 3 (three) hours as needed (cold sores).), Disp: 15 g, Rfl: 1   anastrozole (ARIMIDEX) 1 MG tablet, TAKE 1 TABLET BY MOUTH EVERY DAY, Disp: 90 tablet, Rfl: 1   CALCIUM-VITAMIN D PO, Take 2 tablets by mouth daily., Disp: , Rfl:    cetirizine (ZYRTEC) 10 MG tablet, TAKE 1 TABLET(10 MG) BY MOUTH DAILY, Disp: 90 tablet, Rfl: 1   diclofenac Sodium (VOLTAREN) 1 % GEL, Apply 1 application topically daily., Disp: , Rfl:    FLUoxetine (PROZAC)  10 MG tablet, TAKE 1 TABLET(10 MG) BY MOUTH DAILY, Disp: 90 tablet, Rfl: 1   ibuprofen (ADVIL) 800 MG tablet, Take 1 tablet (800 mg total) by mouth every 8 (eight) hours as needed., Disp: 30 tablet, Rfl: 0   MILK THISTLE PO, Take 1 capsule by mouth daily., Disp: , Rfl:    mometasone (ELOCON) 0.1 % ointment, Apply topically daily., Disp: 45 g, Rfl: 0   simvastatin (ZOCOR) 10 MG tablet, TAKE 1 TABLET(10 MG) BY MOUTH AT BEDTIME, Disp: 90 tablet, Rfl: 2   traZODone (DESYREL) 50 MG tablet, TAKE 1 TO 2 TABLETS(50 TO 100 MG) BY MOUTH AT BEDTIME AS NEEDED FOR SLEEP, Disp: 90 tablet, Rfl: 0   Turmeric (QC TUMERIC COMPLEX PO), Take 1 Capful by mouth daily., Disp: , Rfl:    valACYclovir (VALTREX) 1000 MG tablet, Take 2 tablets (2,000 mg) at the start of your cold sore.  Take 1 tablet (1,000 mg) daily until sores have resolved., Disp: 15 tablet, Rfl: 1  Physical exam:  Vitals:   11/08/21 1331  BP: 122/86  Pulse: 76  Resp: 16  Temp: 99 F (37.2 C)  TempSrc: Tympanic  Weight: 147 lb 12.8 oz (67 kg)  Height: '5\' 7"'$  (1.702 m)   Physical Exam Constitutional:       General: She is not in acute distress. Cardiovascular:     Rate and Rhythm: Normal rate and regular rhythm.     Heart sounds: Normal heart sounds.  Pulmonary:     Effort: Pulmonary effort is normal.     Breath sounds: Normal breath sounds.  Abdominal:     General: Bowel sounds are normal.     Palpations: Abdomen is soft.  Skin:    General: Skin is warm and dry.  Neurological:     Mental Status: She is alert and oriented to person, place, and time.    Breast exam was performed in seated and lying down position. Patient is status post right lumpectomy with a well-healed surgical scar. No evidence of any palpable masses. No evidence of axillary adenopathy. No evidence of any palpable masses or lumps in the left breast. No evidence of leftt axillary adenopathy      Latest Ref Rng & Units 08/23/2021    1:49 PM  CMP  Glucose 65 - 139 mg/dL 97   BUN 7 - 25 mg/dL 8   Creatinine 0.60 - 1.00 mg/dL 0.64   Sodium 135 - 146 mmol/L 139   Potassium 3.5 - 5.3 mmol/L 4.0   Chloride 98 - 110 mmol/L 104   CO2 20 - 32 mmol/L 25   Calcium 8.6 - 10.4 mg/dL 9.9   Total Protein 6.1 - 8.1 g/dL 6.7   Total Bilirubin 0.2 - 1.2 mg/dL 0.6   AST 10 - 35 U/L 21   ALT 6 - 29 U/L 17       Latest Ref Rng & Units 08/23/2021    1:49 PM  CBC  WBC 3.8 - 10.8 Thousand/uL 7.0   Hemoglobin 11.7 - 15.5 g/dL 13.8   Hematocrit 35.0 - 45.0 % 40.7   Platelets 140 - 400 Thousand/uL 227      Assessment and plan- Patient is a 76 y.o. female with history of ER positive DCIS currently on Arimidex here for routine follow-up  Plan clinically patient is doing well with no concerning signs and symptoms of recurrence based on today's exam.  She recently had a mammogram in August 2023 which was normal.  Patient is also  continuing calcium along with vitamin D.  Discussed the results of bone density scan with the patient which does show osteopenia at the left femur neck with a score of -1.4.  Scores have remained more or less  unchanged as compared to her prior bone density in 2021.  10-year probability of a major osteoporotic fracture is less than 20% and hip fracture less than 3%.  She will therefore not require any adjuvant bisphosphonates at this time.   Visit Diagnosis 1. Visit for monitoring Arimidex therapy   2. Encounter for follow-up surveillance of ductal carcinoma in situ (DCIS) of breast   3. Osteopenia of neck of left femur      Dr. Randa Evens, MD, MPH [facility] [phone] 11/10/2021 5:27 PM

## 2021-12-02 DIAGNOSIS — H524 Presbyopia: Secondary | ICD-10-CM | POA: Diagnosis not present

## 2021-12-05 ENCOUNTER — Ambulatory Visit: Payer: Medicare Other | Admitting: Podiatry

## 2021-12-14 ENCOUNTER — Other Ambulatory Visit: Payer: Self-pay | Admitting: Internal Medicine

## 2021-12-14 DIAGNOSIS — F5104 Psychophysiologic insomnia: Secondary | ICD-10-CM

## 2021-12-16 ENCOUNTER — Other Ambulatory Visit: Payer: Self-pay | Admitting: Internal Medicine

## 2021-12-16 DIAGNOSIS — F5104 Psychophysiologic insomnia: Secondary | ICD-10-CM

## 2021-12-16 NOTE — Telephone Encounter (Signed)
Medication Refill - Medication: traZODone (DESYREL) 50 MG tablet  Denied says being requested too soon,but patient is al out  Has the patient contacted their pharmacy? yes (Agent: If no, request that the patient contact the pharmacy for the refill. If patient does not wish to contact the pharmacy document the reason why and proceed with request.) (Agent: If yes, when and what did the pharmacy advise?)contact pcp  Preferred Pharmacy (with phone number or street name):  [facility], [place]. ([address] Phone: [phone]  Fax: [phone]     Has the patient been seen for an appointment in the last year OR does the patient have an upcoming appointment? yes  Agent: Please be advised that RX refills may take up to 3 business days. We ask that you follow-up with your pharmacy.

## 2021-12-16 NOTE — Telephone Encounter (Signed)
Last refill 10/30/21.requested refill too soon.  Requested Prescriptions  Refused Prescriptions Disp Refills   traZODone (DESYREL) 50 MG tablet [Pharmacy Med Name: TRAZODONE '50MG'$  TABLETS] 90 tablet 0    Sig: TAKE 1 TO 2 TABLETS(50 TO 100 MG) BY MOUTH AT BEDTIME AS NEEDED FOR SLEEP     Psychiatry: Antidepressants - Serotonin Modulator Passed - 12/14/2021 12:57 PM      Passed - Valid encounter within last 6 months    Recent Outpatient Visits           3 months ago Encounter for general adult medical examination with abnormal findings   [facility] [place], Coralie Keens, NP   1 year ago Encounter for completion of form with patient   [facility], Coralie Keens, NP   1 year ago Encounter for general adult medical examination with abnormal findings   [facility] [place], Coralie Keens, NP   1 year ago Acute viral conjunctivitis of right eye   [place], NP   1 year ago Right arm pain   [facility] [place], Lupita Raider, [place]

## 2021-12-17 MED ORDER — TRAZODONE HCL 50 MG PO TABS: ORAL_TABLET | ORAL | 0 refills | Status: DC

## 2021-12-17 NOTE — Telephone Encounter (Signed)
Requested Prescriptions  Pending Prescriptions Disp Refills   traZODone (DESYREL) 50 MG tablet 90 tablet 0    Sig: TAKE 1 TO 2 TABLETS(50 TO 100 MG) BY MOUTH AT BEDTIME AS NEEDED FOR SLEEP     Psychiatry: Antidepressants - Serotonin Modulator Passed - 12/16/2021  4:48 PM      Passed - Valid encounter within last 6 months    Recent Outpatient Visits           3 months ago Encounter for general adult medical examination with abnormal findings   [facility] [place], Coralie Keens, NP   1 year ago Encounter for completion of form with patient   [facility], Coralie Keens, NP   1 year ago Encounter for general adult medical examination with abnormal findings   [facility] [place], Coralie Keens, NP   1 year ago Acute viral conjunctivitis of right eye   [place], NP   1 year ago Right arm pain   [facility] [place], Lupita Raider, [place]

## 2021-12-18 ENCOUNTER — Other Ambulatory Visit: Payer: Self-pay | Admitting: Internal Medicine

## 2021-12-18 DIAGNOSIS — F5104 Psychophysiologic insomnia: Secondary | ICD-10-CM

## 2021-12-18 NOTE — Telephone Encounter (Signed)
Refused Trazodone 50 mg because this is a duplicate request.   Went to [place] 12/17/2021 at 9:47 AM received.

## 2022-01-25 ENCOUNTER — Other Ambulatory Visit: Payer: Self-pay | Admitting: Internal Medicine

## 2022-01-25 ENCOUNTER — Other Ambulatory Visit: Payer: Self-pay | Admitting: Oncology

## 2022-01-25 DIAGNOSIS — B001 Herpesviral vesicular dermatitis: Secondary | ICD-10-CM

## 2022-01-26 NOTE — Telephone Encounter (Signed)
Requested Prescriptions  Pending Prescriptions Disp Refills   valACYclovir (VALTREX) 1000 MG tablet [Pharmacy Med Name: VALACYCLOVIR HCL 1 GRAM TABLET] 15 tablet 1    Sig: TAKE 2 TABLETS AT THE START OF YOUR COLD SORE. TAKE 1 TABLET DAILY UNTIL SORES HAVE RESOLVED.     Antimicrobials:  Antiviral Agents - Anti-Herpetic Passed - 01/25/2022 10:25 AM      Passed - Valid encounter within last 12 months    Recent Outpatient Visits           5 months ago Encounter for general adult medical examination with abnormal findings   [facility] [place], Coralie Keens, NP   1 year ago Encounter for completion of form with patient   [facility], Coralie Keens, NP   1 year ago Encounter for general adult medical examination with abnormal findings   [facility] [place], Coralie Keens, NP   1 year ago Acute viral conjunctivitis of right eye   [place], NP   2 years ago Right arm pain   [facility], Lupita Raider, [place]

## 2022-01-29 ENCOUNTER — Other Ambulatory Visit: Payer: Self-pay | Admitting: Internal Medicine

## 2022-01-29 DIAGNOSIS — F5104 Psychophysiologic insomnia: Secondary | ICD-10-CM

## 2022-01-29 NOTE — Telephone Encounter (Signed)
Requested Prescriptions  Pending Prescriptions Disp Refills   traZODone (DESYREL) 50 MG tablet [Pharmacy Med Name: TRAZODONE '50MG'$  TABLETS] 60 tablet 0    Sig: TAKE 1 TO 2 TABLETS(50 TO 100 MG) BY MOUTH AT BEDTIME AS NEEDED FOR SLEEP     Psychiatry: Antidepressants - Serotonin Modulator Passed - 01/29/2022  9:29 AM      Passed - Valid encounter within last 6 months    Recent Outpatient Visits           5 months ago Encounter for general adult medical examination with abnormal findings   [facility] [place], Coralie Keens, NP   1 year ago Encounter for completion of form with patient   [facility], Coralie Keens, NP   1 year ago Encounter for general adult medical examination with abnormal findings   [facility] [place], Coralie Keens, NP   1 year ago Acute viral conjunctivitis of right eye   [place], NP   2 years ago Right arm pain   [facility] [place], Lupita Raider, [place]

## 2022-01-31 ENCOUNTER — Other Ambulatory Visit: Payer: Self-pay | Admitting: Internal Medicine

## 2022-01-31 DIAGNOSIS — F5104 Psychophysiologic insomnia: Secondary | ICD-10-CM

## 2022-01-31 NOTE — Telephone Encounter (Signed)
Unable to refill per protocol, Rx request is too soon. Last refill 01/28/22 for 30 days.  Requested Prescriptions  Pending Prescriptions Disp Refills   traZODone (DESYREL) 50 MG tablet [Pharmacy Med Name: TRAZODONE '50MG'$  TABLETS] 60 tablet 0    Sig: TAKE 1 TO 2 TABLETS(50 TO 100 MG) BY MOUTH AT BEDTIME AS NEEDED FOR SLEEP     Psychiatry: Antidepressants - Serotonin Modulator Passed - 01/31/2022  8:04 AM      Passed - Valid encounter within last 6 months    Recent Outpatient Visits           5 months ago Encounter for general adult medical examination with abnormal findings   [facility] [place], Coralie Keens, NP   1 year ago Encounter for completion of form with patient   [facility], Coralie Keens, NP   1 year ago Encounter for general adult medical examination with abnormal findings   [facility] [place], Coralie Keens, NP   1 year ago Acute viral conjunctivitis of right eye   [place], NP   2 years ago Right arm pain   [facility] [place], Lupita Raider, [place]

## 2022-02-04 ENCOUNTER — Other Ambulatory Visit: Payer: Self-pay | Admitting: *Deleted

## 2022-02-13 ENCOUNTER — Ambulatory Visit: Payer: Medicare Other | Admitting: Radiation Oncology

## 2022-02-13 ENCOUNTER — Ambulatory Visit: Payer: Medicare Other | Admitting: Podiatry

## 2022-02-19 DIAGNOSIS — K080 Exfoliation of teeth due to systemic causes: Secondary | ICD-10-CM | POA: Diagnosis not present

## 2022-02-26 ENCOUNTER — Other Ambulatory Visit: Payer: Self-pay | Admitting: Internal Medicine

## 2022-02-26 DIAGNOSIS — F419 Anxiety disorder, unspecified: Secondary | ICD-10-CM

## 2022-02-26 NOTE — Telephone Encounter (Signed)
Requested Prescriptions  Pending Prescriptions Disp Refills   FLUoxetine (PROZAC) 10 MG tablet [Pharmacy Med Name: FLUOXETINE '10MG'$  TABLETS] 90 tablet 0    Sig: TAKE 1 TABLET(10 MG) BY MOUTH DAILY     Psychiatry:  Antidepressants - SSRI Failed - 02/26/2022 10:31 AM      Failed - Valid encounter within last 6 months    Recent Outpatient Visits           6 months ago Encounter for general adult medical examination with abnormal findings   [facility] [place], Coralie Keens, NP   1 year ago Encounter for completion of form with patient   [facility] [place], Coralie Keens, NP   1 year ago Encounter for general adult medical examination with abnormal findings   [facility] [place], Coralie Keens, NP   1 year ago Acute viral conjunctivitis of right eye   [facility] Kath[facility] [place]add[place], NP   2 years ago Right arm pain   [facility] [place], Lupita Raider, [place]

## 2022-03-01 ENCOUNTER — Other Ambulatory Visit: Payer: Self-pay | Admitting: Internal Medicine

## 2022-03-01 DIAGNOSIS — F5104 Psychophysiologic insomnia: Secondary | ICD-10-CM

## 2022-03-05 ENCOUNTER — Other Ambulatory Visit: Payer: Self-pay | Admitting: Internal Medicine

## 2022-03-05 ENCOUNTER — Ambulatory Visit: Payer: Medicare Other | Admitting: Radiation Oncology

## 2022-03-05 DIAGNOSIS — F5104 Psychophysiologic insomnia: Secondary | ICD-10-CM

## 2022-03-05 NOTE — Telephone Encounter (Signed)
Refilled 03/03/22

## 2022-03-13 ENCOUNTER — Other Ambulatory Visit: Payer: Self-pay | Admitting: Internal Medicine

## 2022-03-13 DIAGNOSIS — R059 Cough, unspecified: Secondary | ICD-10-CM

## 2022-03-13 NOTE — Telephone Encounter (Signed)
Requested Prescriptions  Pending Prescriptions Disp Refills   cetirizine (ZYRTEC) 10 MG tablet [Pharmacy Med Name: CETIRIZINE 10MG TABLETS] 90 tablet 0    Sig: TAKE 1 TABLET(10 MG) BY MOUTH DAILY     Ear, Nose, and Throat:  Antihistamines 2 Passed - 03/13/2022  3:45 AM      Passed - Cr in normal range and within 360 days    Creat  Date Value Ref Range Status  08/23/2021 0.64 0.60 - 1.00 mg/dL Final         Passed - Valid encounter within last 12 months    Recent Outpatient Visits           6 months ago Encounter for general adult medical examination with abnormal findings   [facility], Coralie Keens, NP   1 year ago Encounter for completion of form with patient   [facility] [place], Coralie Keens, NP   1 year ago Encounter for general adult medical examination with abnormal findings   [facility] [place], Coralie Keens, NP   2 years ago Acute viral conjunctivitis of right eye   [facility] Kathrine Haddock, NP   2 years ago Right arm pain   [facility] [place], Lupita Raider, [place]

## 2022-03-17 ENCOUNTER — Other Ambulatory Visit: Payer: Self-pay

## 2022-03-17 DIAGNOSIS — R059 Cough, unspecified: Secondary | ICD-10-CM

## 2022-03-17 MED ORDER — CETIRIZINE HCL 10 MG PO TABS: 10.0000 mg | ORAL_TABLET | Freq: Every day | ORAL | 0 refills | Status: DC

## 2022-03-20 ENCOUNTER — Ambulatory Visit
Admission: RE | Admit: 2022-03-20 | Discharge: 2022-03-20 | Disposition: A | Discharge status: Home / Self Care | Payer: Medicare Other | Source: Ambulatory Visit | Attending: Radiation Oncology | Admitting: Radiation Oncology

## 2022-03-20 ENCOUNTER — Encounter: Payer: Self-pay | Admitting: Radiation Oncology

## 2022-03-20 VITALS — BP 112/78 | HR 80 | Temp 99.1°F | Resp 16 | Ht 67.0 in | Wt 144.0 lb

## 2022-03-20 DIAGNOSIS — Z170 Estrogen receptor positive status [ER+]: Secondary | ICD-10-CM | POA: Insufficient documentation

## 2022-03-20 DIAGNOSIS — Z79811 Long term (current) use of aromatase inhibitors: Secondary | ICD-10-CM | POA: Diagnosis not present

## 2022-03-20 DIAGNOSIS — D0511 Intraductal carcinoma in situ of right breast: Secondary | ICD-10-CM | POA: Diagnosis not present

## 2022-03-20 DIAGNOSIS — Z79899 Other long term (current) drug therapy: Secondary | ICD-10-CM | POA: Insufficient documentation

## 2022-03-20 DIAGNOSIS — Z923 Personal history of irradiation: Secondary | ICD-10-CM | POA: Diagnosis not present

## 2022-03-20 NOTE — Progress Notes (Signed)
Radiation Oncology Follow up Note  Name: Tracie Reed   Date:   03/20/2022 MRN:  [id number] DOB: 1945/12/07    This 77 y.o. 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  Seen today in routine follow-up she is doing well.  She specifically denies breast tenderness cough.  She does have some pain in her back from rotation as if putting her seatbelt on which is intermittent and not persistent.  She had a mammogram back in August.  Which I have reviewed was a BI-RADS 2 benign.  She is currently on Arimidex tolerating it well without side effect.  COMPLICATIONS OF TREATMENT: none  FOLLOW UP COMPLIANCE: keeps appointments   PHYSICAL EXAM:  BP 112/78 (BP Location: Left Arm, Patient Position: Sitting, Cuff Size: Normal)   Pulse 80   Temp 99.1 F (37.3 C) (Tympanic)   Resp 16   Ht 5' 7"$  (1.702 m) Comment: stated HT  Wt 144 lb (65.3 kg)   BMI 22.55 kg/m  Lungs are clear to A&P cardiac examination essentially unremarkable with regular rate and rhythm. No dominant mass or nodularity is noted in either breast in 2 positions examined. Incision is well-healed. No axillary or supraclavicular adenopathy is appreciated. Cosmetic result is excellent.  Well-developed well-nourished patient in NAD. HEENT reveals PERLA, EOMI, discs not visualized.  Oral cavity is clear. No oral mucosal lesions are identified. Neck is clear without evidence of cervical or supraclavicular adenopathy. Lungs are clear to A&P. Cardiac examination is essentially unremarkable with regular rate and rhythm without murmur rub or thrill. Abdomen is benign with no organomegaly or masses noted. Motor sensory and DTR levels are equal and  symmetric in the upper and lower extremities. Cranial nerves II through XII are grossly intact. Proprioception is intact. No peripheral adenopathy or edema is identified. No motor or sensory levels are noted. Crude visual fields are within normal range.  RADIOLOGY RESULTS: Mammograms reviewed compatible with above-stated findings  PLAN: Present time patient is doing well with no evidence of disease now out 1 year from whole breast radiation and pleased with her overall progress.  She continues on Arimidex without side effect.  I have believe her pain may be related to some back issues.  I have asked to see her back in 1 year for follow-up.  Patient is to call with any concerns.  I would like to take this opportunity to thank you for allowing me to participate in the care of your patient.Noreene Filbert, MD

## 2022-03-28 ENCOUNTER — Ambulatory Visit (INDEPENDENT_AMBULATORY_CARE_PROVIDER_SITE_OTHER): Payer: Medicare Other

## 2022-03-28 ENCOUNTER — Telehealth: Payer: Self-pay | Admitting: Internal Medicine

## 2022-03-28 VITALS — Ht 67.0 in | Wt 147.0 lb

## 2022-03-28 DIAGNOSIS — Z0000 Encounter for general adult medical examination without abnormal findings: Secondary | ICD-10-CM

## 2022-03-28 NOTE — Telephone Encounter (Signed)
Pt is calling to request a PA for FLUoxetine (PROZAC) 10 MG tablet [id number] . Please advise [phone]- [phone]

## 2022-03-28 NOTE — Patient Instructions (Signed)
Tracie Reed , Thank you for taking time to come for your Medicare Wellness Visit. I appreciate your ongoing commitment to your health goals. Please review the following plan we discussed and let me know if I can assist you in the future.   These are the goals we discussed:  Goals      Activity and Exercise Increased     Evidence-based guidance:  Review current exercise levels.  Assess patient perspective on exercise or activity level, barriers to increasing activity, motivation and readiness for change.  Recommend or set healthy exercise goal based on individual tolerance.  Encourage small steps toward making change in amount of exercise or activity.  Urge reduction of sedentary activities or screen time.  Promote group activities within the community or with family or support person.  Consider referral to rehabiliation therapist for assessment and exercise/activity plan.   Notes:   The pt would like to complete a [employer] Fun Run normal in August or September      DIET - EAT MORE FRUITS AND VEGETABLES     DIET - INCREASE WATER INTAKE     Recommend to drink at least 6-8 8oz glasses of water per day.      Patient Stated     02/07/2020, stay healthy        This is a list of the screening recommended for you and due dates:  Health Maintenance  Topic Date Due   DTaP/Tdap/Td vaccine (1 - Tdap) Never done   Flu Shot  08/27/2021   COVID-19 Vaccine (7 - 2023-24 season) 01/03/2022   Medicare Annual Wellness Visit  03/28/2023   Pneumonia Vaccine  Completed   DEXA scan (bone density measurement)  Completed   Hepatitis C Screening: USPSTF Recommendation to screen - Ages 19-79 yo.  Completed   Zoster (Shingles) Vaccine  Completed   HPV Vaccine  Aged Out   Colon Cancer Screening  Discontinued    Advanced directives: no  Conditions/risks identified: none  Next appointment: Follow up in one year for your annual wellness visit 04/03/23 @ 10:15 am by phone   Preventive Care 65 Years and  Older, Female Preventive care refers to lifestyle choices and visits with your health care provider that can promote health and wellness. What does preventive care include? A yearly physical exam. This is also called an annual well check. Dental exams once or twice a year. Routine eye exams. Ask your health care provider how often you should have your eyes checked. Personal lifestyle choices, including: Daily care of your teeth and gums. Regular physical activity. Eating a healthy diet. Avoiding tobacco and drug use. Limiting alcohol use. Practicing safe sex. Taking low-dose aspirin every day. Taking vitamin and mineral supplements as recommended by your health care provider. What happens during an annual well check? The services and screenings done by your health care provider during your annual well check will depend on your age, overall health, lifestyle risk factors, and family history of disease. Counseling  Your health care provider may ask you questions about your: Alcohol use. Tobacco use. Drug use. Emotional well-being. Home and relationship well-being. Sexual activity. Eating habits. History of falls. Memory and ability to understand (cognition). Work and work Statistician. Reproductive health. Screening  You may have the following tests or measurements: Height, weight, and BMI. Blood pressure. Lipid and cholesterol levels. These may be checked every 5 years, or more frequently if you are over 13 years old. Skin check. Lung cancer screening. You may have this screening  every year starting at age 4 if you have a 30-pack-year history of smoking and currently smoke or have quit within the past 15 years. Fecal occult blood test (FOBT) of the stool. You may have this test every year starting at age 52. Flexible sigmoidoscopy or colonoscopy. You may have a sigmoidoscopy every 5 years or a colonoscopy every 10 years starting at age 65. Hepatitis C blood test. Hepatitis B  blood test. Sexually transmitted disease (STD) testing. Diabetes screening. This is done by checking your blood sugar (glucose) after you have not eaten for a while (fasting). You may have this done every 1-3 years. Bone density scan. This is done to screen for osteoporosis. You may have this done starting at age 53. Mammogram. This may be done every 1-2 years. Talk to your health care provider about how often you should have regular mammograms. Talk with your health care provider about your test results, treatment options, and if necessary, the need for more tests. Vaccines  Your health care provider may recommend certain vaccines, such as: Influenza vaccine. This is recommended every year. Tetanus, diphtheria, and acellular pertussis (Tdap, Td) vaccine. You may need a Td booster every 10 years. Zoster vaccine. You may need this after age 80. Pneumococcal 13-valent conjugate (PCV13) vaccine. One dose is recommended after age 38. Pneumococcal polysaccharide (PPSV23) vaccine. One dose is recommended after age 93. Talk to your health care provider about which screenings and vaccines you need and how often you need them. This information is not intended to replace advice given to you by your health care provider. Make sure you discuss any questions you have with your health care provider. Document Released: 02/09/2015 Document Revised: 10/03/2015 Document Reviewed: 11/14/2014 Elsevier Interactive Patient Education  2017 [employer] Prevention in the Home Falls can cause injuries. They can happen to people of all ages. There are many things you can do to make your home safe and to help prevent falls. What can I do on the outside of my home? Regularly fix the edges of walkways and driveways and fix any cracks. Remove anything that might make you trip as you walk through a door, such as a raised step or threshold. Trim any bushes or trees on the path to your home. Use bright outdoor  lighting. Clear any walking paths of anything that might make someone trip, such as rocks or tools. Regularly check to see if handrails are loose or broken. Make sure that both sides of any steps have handrails. Any raised decks and porches should have guardrails on the edges. Have any leaves, snow, or ice cleared regularly. Use sand or salt on walking paths during winter. Clean up any spills in your garage right away. This includes oil or grease spills. What can I do in the bathroom? Use night lights. Install grab bars by the toilet and in the tub and shower. Do not use towel bars as grab bars. Use non-skid mats or decals in the tub or shower. If you need to sit down in the shower, use a plastic, non-slip stool. Keep the floor dry. Clean up any water that spills on the floor as soon as it happens. Remove soap buildup in the tub or shower regularly. Attach bath mats securely with double-sided non-slip rug tape. Do not have throw rugs and other things on the floor that can make you trip. What can I do in the bedroom? Use night lights. Make sure that you have a light by your bed  that is easy to reach. Do not use any sheets or blankets that are too big for your bed. They should not hang down onto the floor. Have a firm chair that has side arms. You can use this for support while you get dressed. Do not have throw rugs and other things on the floor that can make you trip. What can I do in the kitchen? Clean up any spills right away. Avoid walking on wet floors. Keep items that you use a lot in easy-to-reach places. If you need to reach something above you, use a strong step stool that has a grab bar. Keep electrical cords out of the way. Do not use floor polish or wax that makes floors slippery. If you must use wax, use non-skid floor wax. Do not have throw rugs and other things on the floor that can make you trip. What can I do with my stairs? Do not leave any items on the stairs. Make  sure that there are handrails on both sides of the stairs and use them. Fix handrails that are broken or loose. Make sure that handrails are as long as the stairways. Check any carpeting to make sure that it is firmly attached to the stairs. Fix any carpet that is loose or worn. Avoid having throw rugs at the top or bottom of the stairs. If you do have throw rugs, attach them to the floor with carpet tape. Make sure that you have a light switch at the top of the stairs and the bottom of the stairs. If you do not have them, ask someone to add them for you. What else can I do to help prevent falls? Wear shoes that: Do not have high heels. Have rubber bottoms. Are comfortable and fit you well. Are closed at the toe. Do not wear sandals. If you use a stepladder: Make sure that it is fully opened. Do not climb a closed stepladder. Make sure that both sides of the stepladder are locked into place. Ask someone to hold it for you, if possible. Clearly mark and make sure that you can see: Any grab bars or handrails. First and last steps. Where the edge of each step is. Use tools that help you move around (mobility aids) if they are needed. These include: Canes. Walkers. Scooters. Crutches. Turn on the lights when you go into a dark area. Replace any light bulbs as soon as they burn out. Set up your furniture so you have a clear path. Avoid moving your furniture around. If any of your floors are uneven, fix them. If there are any pets around you, be aware of where they are. Review your medicines with your doctor. Some medicines can make you feel dizzy. This can increase your chance of falling. Ask your doctor what other things that you can do to help prevent falls. This information is not intended to replace advice given to you by your health care provider. Make sure you discuss any questions you have with your health care provider. Document Released: 11/09/2008 Document Revised: 06/21/2015  Document Reviewed: 02/17/2014 Elsevier Interactive Patient Education  2017 [employer].

## 2022-03-28 NOTE — Progress Notes (Signed)
I connected with  Tracie Reed on 03/28/22 by a audio enabled telemedicine application and verified that I am speaking with the correct person using two identifiers.  Patient Location: Home  Provider Location: Office/Clinic  I discussed the limitations of evaluation and management by telemedicine. The patient expressed understanding and agreed to proceed.  Subjective:   Tracie Reed is a 77 y.o. female who presents for Medicare Annual (Subsequent) preventive examination.  Review of Systems     Cardiac Risk Factors include: advanced age (>59mn, >>65women)     Objective:    Today's Vitals   03/28/22 1101  PainSc: 4    There is no height or weight on file to calculate BMI.     03/28/2022   11:10 AM 03/20/2022   10:45 AM 11/08/2021    1:13 PM 11/08/2021    1:11 PM 05/08/2021   11:22 AM 03/11/2021    8:54 AM 01/29/2021   10:21 AM  Advanced Directives  Does Patient Have a Medical Advance Directive? No Yes Yes No Yes Yes No  Type of ACorporate treasurerof [place]Living will H[place]Living will   [place] will   Does patient want to make changes to medical advance directive?  No - Patient declined No - Patient declined      Copy of [place]oin Chart?  No - copy requested No - copy requested      Would patient like information on creating a medical advance directive? No - Patient declined  No - Patient declined No - Patient declined       Current Medications (verified) Outpatient Encounter Medications as of 03/28/2022  Medication Sig   acyclovir ointment (ZOVIRAX) 5 % Apply 1 application topically every 3 (three) hours as needed. (Patient taking differently: Apply 1 application  topically every 3 (three) hours as needed (cold sores).)   anastrozole (ARIMIDEX) 1 MG tablet Take 1 tablet (1 mg total) by mouth daily.   CALCIUM-VITAMIN D PO Take 2 tablets by mouth daily.   cetirizine (ZYRTEC) 10 MG  tablet Take 1 tablet (10 mg total) by mouth daily.   diclofenac Sodium (VOLTAREN) 1 % GEL Apply 1 application topically daily.   FLUoxetine (PROZAC) 10 MG tablet TAKE 1 TABLET(10 MG) BY MOUTH DAILY   ibuprofen (ADVIL) 800 MG tablet Take 1 tablet (800 mg total) by mouth every 8 (eight) hours as needed.   MILK THISTLE PO Take 1 capsule by mouth daily.   mometasone (ELOCON) 0.1 % ointment Apply topically daily.   simvastatin (ZOCOR) 10 MG tablet TAKE 1 TABLET(10 MG) BY MOUTH AT BEDTIME   traZODone (DESYREL) 50 MG tablet TAKE 1 TO 2 TABLETS(50 TO 100 MG) BY MOUTH AT BEDTIME AS NEEDED FOR SLEEP   Turmeric (QC TUMERIC COMPLEX PO) Take 1 Capful by mouth daily.   valACYclovir (VALTREX) 1000 MG tablet TAKE 2 TABLETS AT THE START OF YOUR COLD SORE. TAKE 1 TABLET DAILY UNTIL SORES HAVE RESOLVED.   No facility-administered encounter medications on file as of 03/28/2022.    Allergies (verified) Patient has no known allergies.   History: Past Medical History:  Diagnosis Date   Anxiety    Breast cancer ([place]    DCIS (ductal carcinoma in situ) of breast    History of cataract    Personal history of radiation therapy    Pre-diabetes    last A1C in 2021 was 5.2   Sleep apnea    had uvula  removed   Past Surgical History:  Procedure Laterality Date   BREAST BIOPSY Right 09/14/2020   Affirm bx-calcs, "Ribbon" marker-path pending   BREAST EXCISIONAL BIOPSY Right 1993   neg   BREAST LUMPECTOMY Right 10/12/2020   BREAST LUMPECTOMY WITH RADIOFREQUENCY TAG IDENTIFICATION Right 10/12/2020   Procedure: BREAST LUMPECTOMY WITH RADIOFREQUENCY TAG IDENTIFICATION;  Surgeon: Ronny Bacon, MD;  Location: [facility] ORS;  Service: General;  Laterality: Right;   BUNIONECTOMY Bilateral    CATARACT EXTRACTION  09/03/2020   CHOLECYSTECTOMY     COLONOSCOPY WITH PROPOFOL N/A 07/31/2020   Procedure: COLONOSCOPY WITH PROPOFOL;  Surgeon: Lucilla Lame, MD;  Location: [facility] ENDOSCOPY;  Service: Endoscopy;  Laterality: N/A;    IRIDOTOMY / IRIDECTOMY Right    UVULECTOMY     Family History  Problem Relation Age of Onset   Breast cancer Mother    Diabetes Mother    Depression Mother    Healthy Brother    Social History   Socioeconomic History   Marital status: Significant Other    Spouse name: Not on file   Number of children: 2   Years of education: Not on file   Highest education level: Bachelor's degree (e.g., BA, AB, BS)  Occupational History   Occupation: self employed    Comment: Holiday representative  Tobacco Use   Smoking status: Former    Packs/day: 0.50    Years: 5.00    Total pack years: 2.50    Types: Cigarettes    Quit date: 11/12/1987    Years since quitting: 34.3    Passive exposure: Past   Smokeless tobacco: Never  Vaping Use   Vaping Use: Never used  Substance and Sexual Activity   Alcohol use: Yes    Comment: 2 pints on weekend beer   Drug use: Not Currently    Comment: past marijuana - very remote use   Sexual activity: Yes  Other Topics Concern   Not on file  Social History Narrative   Not on file   Social Determinants of Health   Financial Resource Strain: Low Risk  (03/28/2022)   Overall Financial Resource Strain (CARDIA)    Difficulty of Paying Living Expenses: Not hard at all  Food Insecurity: No Food Insecurity (03/28/2022)   Hunger Vital Sign    Worried About Running Out of Food in the Last Year: Never true    Ran Out of Food in the Last Year: Never true  Transportation Needs: No Transportation Needs (03/28/2022)   PRAPARE - Hydrologist (Medical): No    Lack of Transportation (Non-Medical): No  Physical Activity: Sufficiently Active (03/28/2022)   Exercise Vital Sign    Days of Exercise per Week: 3 days    Minutes of Exercise per Session: 60 min  Stress: No Stress Concern Present (03/28/2022)   [place]    Feeling of Stress : Not at all  Social  Connections: Moderately Isolated (03/28/2022)   Social Connection and Isolation Panel [NHANES]    Frequency of Communication with Friends and Family: More than three times a week    Frequency of Social Gatherings with Friends and Family: More than three times a week    Attends Religious Services: Never    Marine scientist or Organizations: No    Attends Archivist Meetings: Never    Marital Status: Living with partner    Tobacco Counseling Counseling given: Not Answered  Clinical Intake:  Pre-visit preparation completed: Yes  Pain : 0-10 Pain Score: 4  Pain Type: Chronic pain Pain Location: Rib cage Pain Radiating Towards: muscles Pain Relieving Factors: heat, ibuprofen  Pain Relieving Factors: heat, ibuprofen  Nutritional Risks: None Diabetes: No  How often do you need to have someone help you when you read instructions, pamphlets, or other written materials from your doctor or pharmacy?: 1 - Never  Diabetic?no  Interpreter Needed?: No  Information entered by :: Kirke Shaggy, LPN   Activities of Daily Living    03/28/2022   11:12 AM 03/24/2022   12:23 PM  In your present state of health, do you have any difficulty performing the following activities:  Hearing? 0 0  Vision? 0 0  Difficulty concentrating or making decisions? 0 0  Walking or climbing stairs? 0 0  Dressing or bathing? 0 0  Doing errands, shopping? 0 0  Preparing Food and eating ? N N  Using the Toilet? N N  In the past six months, have you accidently leaked urine? N N  Do you have problems with loss of bowel control? N N  Managing your Medications? N N  Managing your Finances? N N  Housekeeping or managing your Housekeeping? N N    Patient Care Team: Jearld Fenton, NP as PCP - General (Internal Medicine) Rico Junker, RN as Oncology Nurse Navigator Sindy Guadeloupe, MD as Consulting Physician (Oncology) Noreene Filbert, MD as Consulting Physician (Radiation  Oncology) Ronny Bacon, MD as Consulting Physician (General Surgery)  Indicate any recent Medical Services you may have received from other than [facility] providers in the past year (date may be approximate).     Assessment:   This is a routine wellness examination for [facility].  Hearing/Vision screen Hearing Screening - Comments:: Has hearing aids, but doesn't wear them Vision Screening - Comments:: Wears glasses- Dr.Hagar in [place] issues and exercise activities discussed: Current Exercise Habits: Home exercise routine, Type of exercise: stretching;strength training/weights;walking, Time (Minutes): 30, Frequency (Times/Week): 3, Weekly Exercise (Minutes/Week): 90, Intensity: Mild   Goals Addressed             This Visit's Progress    DIET - EAT MORE FRUITS AND VEGETABLES         Depression Screen    03/28/2022   11:08 AM 08/23/2021    1:32 PM 03/14/2021   11:09 AM 02/07/2020    8:28 AM 09/26/2019    3:29 PM 07/05/2019    9:59 AM 12/21/2018   10:44 AM  PHQ 2/9 Scores  PHQ - 2 Score 0 0 0 0 0 0 0  PHQ- 9 Score 0 0 '1  3 1     '$ Fall Risk    03/28/2022   11:12 AM 03/24/2022   12:23 PM 08/23/2021    1:32 PM 05/21/2021   10:38 AM 03/14/2021   11:11 AM  Fall Risk   Falls in the past year? 0 0 0 0 0  Number falls in past yr: 0 0 0    Injury with Fall? 0 0 0  0  Risk for fall due to : No Fall Risks  No Fall Risks  No Fall Risks  Follow up Falls prevention discussed;Falls evaluation completed  Falls evaluation completed  Falls evaluation completed    FALL RISK PREVENTION PERTAINING TO THE HOME:  Any stairs in or around the home? Yes  If so, are there any without handrails? No  Home free of loose  throw rugs in walkways, pet beds, electrical cords, etc? Yes  Adequate lighting in your home to reduce risk of falls? Yes   ASSISTIVE DEVICES UTILIZED TO PREVENT FALLS:  Life alert? No  Use of a cane, walker or w/c? No  Grab bars in the bathroom? Yes  Shower chair or bench  in shower? Yes  Elevated toilet seat or a handicapped toilet? No    Cognitive Function:        03/28/2022   11:18 AM 03/14/2021   11:14 AM 02/07/2020    8:32 AM  6CIT Screen  What Year? 0 points 0 points 0 points  What month? 0 points 0 points 0 points  What time? 0 points 0 points 0 points  Count back from 20 0 points 0 points 0 points  Months in reverse 0 points 0 points 0 points  Repeat phrase 0 points 0 points 0 points  Total Score 0 points 0 points 0 points    Immunizations Immunization History  Administered Date(s) Administered   Fluad Quad(high Dose 65+) 01/11/2019, 12/14/2019   Influenza, High Dose Seasonal PF 11/11/2017, 10/23/2020   Moderna Covid-19 Vaccine Bivalent Booster 10yr & up 10/23/2020   Moderna Sars-Covid-2 Vaccination 04/28/2019, 05/26/2019, 12/19/2019, 05/09/2020   Pneumococcal Conjugate-13 01/11/2019   Pneumococcal Polysaccharide-23 02/21/2020, 11/02/2020   Unspecified SARS-COV-2 Vaccination 11/08/2021   Zoster Recombinat (Shingrix) 12/15/2019, 11/02/2020    TDAP status: Due, Education has been provided regarding the importance of this vaccine. Advised may receive this vaccine at local pharmacy or Health Dept. Aware to provide a copy of the vaccination record if obtained from local pharmacy or Health Dept. Verbalized acceptance and understanding.  Flu Vaccine status: Declined, Education has been provided regarding the importance of this vaccine but patient still declined. Advised may receive this vaccine at local pharmacy or Health Dept. Aware to provide a copy of the vaccination record if obtained from local pharmacy or Health Dept. Verbalized acceptance and understanding.  Pneumococcal vaccine status: Up to date  Covid-19 vaccine status: Completed vaccines  Qualifies for Shingles Vaccine? Yes   Zostavax completed No   Shingrix Completed?: Yes  Screening Tests Health Maintenance  Topic Date Due   DTaP/Tdap/Td (1 - Tdap) Never done   INFLUENZA  VACCINE  08/27/2021   COVID-19 Vaccine (7 - 2023-24 season) 01/03/2022   Medicare Annual Wellness (AWV)  03/28/2023   Pneumonia Vaccine 77 Years old  Completed   DEXA SCAN  Completed   Hepatitis C Screening  Completed   Zoster Vaccines- Shingrix  Completed   HPV VACCINES  Aged Out   COLONOSCOPY (Pts 45-464yrInsurance coverage will need to be confirmed)  Discontinued    Health Maintenance  Health Maintenance Due  Topic Date Due   DTaP/Tdap/Td (1 - Tdap) Never done   INFLUENZA VACCINE  08/27/2021   COVID-19 Vaccine (7 - 2023-24 season) 01/03/2022    Colorectal cancer screening: Type of screening: Colonoscopy. Completed 07/31/20. Repeat every 10 years- aged out  Mammogram status: No longer required due to age.  Bone Density status: Completed 08/27/21. Results reflect: Bone density results: OSTEOPENIA. Repeat every 5 years.  Lung Cancer Screening: (Low Dose CT Chest recommended if Age 77-80ears, 30 pack-year currently smoking OR have quit w/in 15years.) does not qualify.     Additional Screening:  Hepatitis C Screening: does qualify; Completed 11/22/15  Vision Screening: Recommended annual ophthalmology exams for early detection of glaucoma and other disorders of the eye. Is the patient up to date with their annual  eye exam?  Yes  Who is the provider or what is the name of the office in which the patient attends annual eye exams? Dr.Hagar If pt is not established with a provider, would they like to be referred to a provider to establish care? No .   Dental Screening: Recommended annual dental exams for proper oral hygiene  Community Resource Referral / Chronic Care Management: CRR required this visit?  No   CCM required this visit?  No      Plan:     I have personally reviewed and noted the following in the patient's chart:   Medical and social history Use of alcohol, tobacco or illicit drugs  Current medications and supplements including opioid prescriptions.  Patient is not currently taking opioid prescriptions. Functional ability and status Nutritional status Physical activity Advanced directives List of other physicians Hospitalizations, surgeries, and ER visits in previous 12 months Vitals Screenings to include cognitive, depression, and falls Referrals and appointments  In addition, I have reviewed and discussed with patient certain preventive protocols, quality metrics, and best practice recommendations. A written personalized care plan for preventive services as well as general preventive health recommendations were provided to patient.     Dionisio David, LPN   [id number]   Nurse Notes: none

## 2022-03-31 ENCOUNTER — Telehealth: Payer: Self-pay | Admitting: Internal Medicine

## 2022-03-31 NOTE — Telephone Encounter (Signed)
Barbaraann Rondo with [employer] prior authorization dept called in stating the patients FLUoxetine (PROZAC) 10 MG tablet is approved for a year and he will fax over the authorization

## 2022-03-31 NOTE — Telephone Encounter (Signed)
Pt has scheduled apt for PA. Does she need to come in to discuss PA? Please advise [phone] Attempted to contact the office 3 times. With no answer.

## 2022-03-31 NOTE — Telephone Encounter (Signed)
Pt advised she is overdue for an office visit.  She is scheduled for 04/03/2022.   Thanks,   -Mickel Baas

## 2022-03-31 NOTE — Telephone Encounter (Signed)
PA submitted to her insurance.   Thanks,   -Mickel Baas

## 2022-04-01 ENCOUNTER — Ambulatory Visit: Payer: Medicare Other | Admitting: Internal Medicine

## 2022-04-03 ENCOUNTER — Encounter: Payer: Self-pay | Admitting: Internal Medicine

## 2022-04-03 ENCOUNTER — Ambulatory Visit (INDEPENDENT_AMBULATORY_CARE_PROVIDER_SITE_OTHER): Payer: Medicare Other | Admitting: Internal Medicine

## 2022-04-03 VITALS — BP 126/80 | HR 75 | Temp 96.8°F | Wt 146.0 lb

## 2022-04-03 DIAGNOSIS — F419 Anxiety disorder, unspecified: Secondary | ICD-10-CM

## 2022-04-03 DIAGNOSIS — B001 Herpesviral vesicular dermatitis: Secondary | ICD-10-CM | POA: Diagnosis not present

## 2022-04-03 DIAGNOSIS — F5104 Psychophysiologic insomnia: Secondary | ICD-10-CM

## 2022-04-03 DIAGNOSIS — C50411 Malignant neoplasm of upper-outer quadrant of right female breast: Secondary | ICD-10-CM

## 2022-04-03 DIAGNOSIS — E7800 Pure hypercholesterolemia, unspecified: Secondary | ICD-10-CM

## 2022-04-03 DIAGNOSIS — M8589 Other specified disorders of bone density and structure, multiple sites: Secondary | ICD-10-CM

## 2022-04-03 DIAGNOSIS — Z853 Personal history of malignant neoplasm of breast: Secondary | ICD-10-CM

## 2022-04-03 MED ORDER — VALACYCLOVIR HCL 1 G PO TABS: ORAL_TABLET | ORAL | 1 refills | Status: DC

## 2022-04-03 NOTE — Assessment & Plan Note (Signed)
C-Met and lipid profile today Encouraged her to consume low-fat diet Continue simvastatin 

## 2022-04-03 NOTE — Progress Notes (Signed)
Subjective:    Patient ID: Tracie Reed, female    DOB: Mar 04, 1945, 77 y.o.   MRN: [id number]  HPI  Patient presents to clinic today for follow-up of chronic conditions.  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  She takes Valacyclovir orally and Acyclovir ointment as needed with good relief of symptoms.  Osteopenia: She is taking Calcium and Vitamin D OTC.  She tries to get weightbearing exercise daily.  Bone density from 08/2021 reviewed.  Anxiety: Chronic, managed on Fluoxetine. She reports her insurance will no longer cover this.  She is not currently seeing a therapist.  She denies depression, SI/HI.  Insomnia: She has difficulty staying asleep.  She takes Trazodone as prescribed good results.  There is no sleep study on file.  HLD: Her last LDL was 76, triglycerides 95, 07/2021.  She denies myalgias on Simvastatin.  She has been trying to consume low-fat diet.  Hx of Breast Cancer: In remission.  She is taking Arimidex as prescribed.  She gets yearly mammograms.  She follows with oncology.  Review of Systems     Past Medical History:  Diagnosis Date   Anxiety    Breast cancer ([place])    DCIS (ductal carcinoma in situ) of breast    History of cataract    Personal history of radiation therapy    Pre-diabetes    last A1C in 2021 was 5.2   Sleep apnea    had uvula removed    Current Outpatient Medications  Medication Sig Dispense Refill   acyclovir ointment (ZOVIRAX) 5 % Apply 1 application topically every 3 (three) hours as needed. (Patient taking differently: Apply 1 application  topically every 3 (three) hours as needed (cold sores).) 15 g 1   anastrozole (ARIMIDEX) 1 MG tablet Take 1 tablet (1 mg total) by mouth daily. 90 tablet 1   CALCIUM-VITAMIN D PO Take 2 tablets by mouth daily.     cetirizine (ZYRTEC) 10 MG tablet Take 1 tablet (10 mg total) by mouth daily. 90 tablet 0   diclofenac Sodium (VOLTAREN) 1 % GEL Apply 1 application topically daily.      FLUoxetine (PROZAC) 10 MG tablet TAKE 1 TABLET(10 MG) BY MOUTH DAILY 90 tablet 0   ibuprofen (ADVIL) 800 MG tablet Take 1 tablet (800 mg total) by mouth every 8 (eight) hours as needed. 30 tablet 0   MILK THISTLE PO Take 1 capsule by mouth daily.     mometasone (ELOCON) 0.1 % ointment Apply topically daily. 45 g 0   simvastatin (ZOCOR) 10 MG tablet TAKE 1 TABLET(10 MG) BY MOUTH AT BEDTIME 90 tablet 2   traZODone (DESYREL) 50 MG tablet TAKE 1 TO 2 TABLETS(50 TO 100 MG) BY MOUTH AT BEDTIME AS NEEDED FOR SLEEP 90 tablet 0   Turmeric (QC TUMERIC COMPLEX PO) Take 1 Capful by mouth daily.     valACYclovir (VALTREX) 1000 MG tablet TAKE 2 TABLETS AT THE START OF YOUR COLD SORE. TAKE 1 TABLET DAILY UNTIL SORES HAVE RESOLVED. 15 tablet 1   No current facility-administered medications for this visit.    No Known Allergies  Family History  Problem Relation Age of Onset   Breast cancer Mother    Diabetes Mother    Depression Mother    Healthy Brother     Social History   Socioeconomic History   Marital status: Significant Other    Spouse name: Not on file   Number of children: 2   Years of education: Not on  file   Highest education level: Bachelor's degree (e.g., BA, AB, BS)  Occupational History   Occupation: self employed    Comment: Holiday representative  Tobacco Use   Smoking status: Former    Packs/day: 0.50    Years: 5.00    Total pack years: 2.50    Types: Cigarettes    Quit date: 11/12/1987    Years since quitting: 34.4    Passive exposure: Past   Smokeless tobacco: Never  Vaping Use   Vaping Use: Never used  Substance and Sexual Activity   Alcohol use: Yes    Comment: 2 pints on weekend beer   Drug use: Not Currently    Comment: past marijuana - very remote use   Sexual activity: Yes  Other Topics Concern   Not on file  Social History Narrative   Not on file   Social Determinants of Health   Financial Resource Strain: Low Risk  (03/28/2022)    Overall Financial Resource Strain (CARDIA)    Difficulty of Paying Living Expenses: Not hard at all  Food Insecurity: No Food Insecurity (03/28/2022)   Hunger Vital Sign    Worried About Running Out of Food in the Last Year: Never true    Ran Out of Food in the Last Year: Never true  Transportation Needs: No Transportation Needs (03/28/2022)   PRAPARE - Hydrologist (Medical): No    Lack of Transportation (Non-Medical): No  Physical Activity: Sufficiently Active (03/28/2022)   Exercise Vital Sign    Days of Exercise per Week: 3 days    Minutes of Exercise per Session: 60 min  Stress: No Stress Concern Present (03/28/2022)   [place]    Feeling of Stress : Not at all  Social Connections: Moderately Isolated (03/28/2022)   Social Connection and Isolation Panel [NHANES]    Frequency of Communication with Friends and Family: More than three times a week    Frequency of Social Gatherings with Friends and Family: More than three times a week    Attends Religious Services: Never    Marine scientist or Organizations: No    Attends Archivist Meetings: Never    Marital Status: Living with partner  Intimate Partner Violence: Not At Risk (03/28/2022)   Humiliation, Afraid, Rape, and Kick questionnaire    Fear of Current or Ex-Partner: No    Emotionally Abused: No    Physically Abused: No    Sexually Abused: No     Constitutional: Denies fever, malaise, fatigue, headache or abrupt weight changes.  HEENT: Denies eye pain, eye redness, ear pain, ringing in the ears, wax buildup, runny nose, nasal congestion, bloody nose, or sore throat. Respiratory: Denies difficulty breathing, shortness of breath, cough or sputum production.   Cardiovascular: Denies chest pain, chest tightness, palpitations or swelling in the hands or feet.  Gastrointestinal: Denies abdominal pain, bloating, constipation,  diarrhea or blood in the stool.  GU: Denies urgency, frequency, pain with urination, burning sensation, blood in urine, odor or discharge. Musculoskeletal: Patient reports muscle pain around the lower ribs.  Denies decrease in range of motion, difficulty with gait, or joint pain and swelling.  Skin: Patient reports intermittent oral ulcerations.  Denies redness, rashes.  Neurological: Patient reports insomnia.  Denies dizziness, difficulty with memory, difficulty with speech or problems with balance and coordination.  Psych: Patient has a history of anxiety.  Denies depression, SI/HI.  No other specific complaints in a complete review of systems (except as listed in HPI above).  Objective:   Physical Exam   BP 126/80 (BP Location: Right Arm, Patient Position: Sitting, Cuff Size: Normal)   Pulse 75   Temp (!) 96.8 F (36 C) (Temporal)   Wt 146 lb (66.2 kg)   SpO2 95%   BMI 22.87 kg/m   Wt Readings from Last 3 Encounters:  03/28/22 147 lb (66.7 kg)  03/20/22 144 lb (65.3 kg)  11/08/21 147 lb 12.8 oz (67 kg)    General: Appears her stated age, well developed, well nourished in NAD. Skin: Warm, dry and intact.  HEENT: Head: normal shape and size; Eyes: sclera white, no icterus, conjunctiva pink, PERRLA and EOMs intact;  Cardiovascular: Normal rate and rhythm. S1,S2 noted.  No murmur, rubs or gallops noted. No JVD or BLE edema. No carotid bruits noted. Pulmonary/Chest: Normal effort and positive vesicular breath sounds. No respiratory distress. No wheezes, rales or ronchi noted.  Musculoskeletal:  No difficulty with gait.  Neurological: Alert and oriented. Coordination normal.  Psychiatric: Mood and affect normal. Behavior is normal. Judgment and thought content normal.    BMET    Component Value Date/Time   NA 139 08/23/2021 1349   K 4.0 08/23/2021 1349   CL 104 08/23/2021 1349   CO2 25 08/23/2021 1349   GLUCOSE 97 08/23/2021 1349   BUN 8 08/23/2021 1349   CREATININE 0.64  08/23/2021 1349   CALCIUM 9.9 08/23/2021 1349   GFRNONAA >60 10/09/2020 1143   GFRNONAA 86 07/19/2020 1026   GFRAA 100 07/19/2020 1026    Lipid Panel     Component Value Date/Time   CHOL 175 08/23/2021 1349   TRIG 95 08/23/2021 1349   HDL 81 08/23/2021 1349   CHOLHDL 2.2 08/23/2021 1349   LDLCALC 76 08/23/2021 1349    CBC    Component Value Date/Time   WBC 7.0 08/23/2021 1349   RBC 4.46 08/23/2021 1349   HGB 13.8 08/23/2021 1349   HCT 40.7 08/23/2021 1349   PLT 227 08/23/2021 1349   MCV 91.3 08/23/2021 1349   MCH 30.9 08/23/2021 1349   MCHC 33.9 08/23/2021 1349   RDW 13.3 08/23/2021 1349   LYMPHSABS 2.0 10/09/2020 1143   MONOABS 0.5 10/09/2020 1143   EOSABS 0.1 10/09/2020 1143   BASOSABS 0.0 10/09/2020 1143    Hgb A1C Lab Results  Component Value Date   HGBA1C 5.2 07/05/2019           Assessment & Plan:     RTC in 6 months for your annual exam Webb Silversmith, NP

## 2022-04-03 NOTE — Assessment & Plan Note (Signed)
Continue valacyclovir and acyclovir ointment as needed

## 2022-04-03 NOTE — Assessment & Plan Note (Signed)
Stable on current dose of fluoxetine Support offered

## 2022-04-03 NOTE — Assessment & Plan Note (Signed)
Continue trazodone as needed. 

## 2022-04-03 NOTE — Assessment & Plan Note (Signed)
Continue Arimidex She will continue to follow with oncology

## 2022-04-03 NOTE — Assessment & Plan Note (Signed)
Continue calcium and vitamin D OTC Continue daily weightbearing exercise

## 2022-04-04 LAB — LIPID PANEL
Cholesterol: 164 mg/dL (ref <200)
HDL: 69 mg/dL (ref >=50)
LDL Cholesterol (Calc): 77 mg/dL (calc)
Non-HDL Cholesterol (Calc): 95 mg/dL (calc) (ref <130)
Total CHOL/HDL Ratio: 2.4 (calc) (ref <5.0)
Triglycerides: 99 mg/dL (ref <150)

## 2022-04-04 LAB — COMPLETE METABOLIC PANEL WITH GFR
AG Ratio: 2.1 (calc) (ref 1.0–2.5)
ALT: 14 U/L (ref 6–29)
AST: 19 U/L (ref 10–35)
Albumin: 4.5 g/dL (ref 3.6–5.1)
Alkaline phosphatase (APISO): 68 U/L (ref 37–153)
BUN: 9 mg/dL (ref 7–25)
CO2: 27 mmol/L (ref 20–32)
Calcium: 9.3 mg/dL (ref 8.6–10.4)
Chloride: 103 mmol/L (ref 98–110)
Creat: 0.64 mg/dL (ref 0.60–1.00)
Globulin: 2.1 g/dL (calc) (ref 1.9–3.7)
Glucose, Bld: 98 mg/dL (ref 65–99)
Potassium: 4.4 mmol/L (ref 3.5–5.3)
Sodium: 138 mmol/L (ref 135–146)
Total Bilirubin: 0.4 mg/dL (ref 0.2–1.2)
Total Protein: 6.6 g/dL (ref 6.1–8.1)
eGFR: 92 mL/min/{1.73_m2} (ref >=60)

## 2022-04-04 LAB — CBC
HCT: 39.4 % (ref 35.0–45.0)
Hemoglobin: 13.5 g/dL (ref 11.7–15.5)
MCH: 30.8 pg (ref 27.0–33.0)
MCHC: 34.3 g/dL (ref 32.0–36.0)
MCV: 90 fL (ref 80.0–100.0)
MPV: 10.4 fL (ref 7.5–12.5)
Platelets: 275 10*3/uL (ref 140–400)
RBC: 4.38 10*6/uL (ref 3.80–5.10)
RDW: 13.8 % (ref 11.0–15.0)
WBC: 6.4 10*3/uL (ref 3.8–10.8)

## 2022-04-21 ENCOUNTER — Other Ambulatory Visit: Payer: Self-pay | Admitting: Internal Medicine

## 2022-04-21 DIAGNOSIS — F5104 Psychophysiologic insomnia: Secondary | ICD-10-CM

## 2022-04-21 NOTE — Telephone Encounter (Signed)
Medication Refill - Medication: traZODone (DESYREL) 50 MG tablet   Has the patient contacted their pharmacy? Yes.     Preferred Pharmacy (with phone number or street name):  [facility], [place]. ([address] Phone: [phone]  Fax: [phone]     Has the patient been seen for an appointment in the last year OR does the patient have an upcoming appointment? Yes.    The patient is requesting a higher quantity so she doesn't have to order every month and a half or at least a couple of refills. Please assist patient further

## 2022-04-22 MED ORDER — TRAZODONE HCL 50 MG PO TABS: ORAL_TABLET | ORAL | 0 refills | Status: DC

## 2022-04-22 NOTE — Telephone Encounter (Signed)
Pt. Had an OV with Webb Silversmith on 04/03/2022.   This medication to be continued per note.  Requested Prescriptions  Pending Prescriptions Disp Refills   traZODone (DESYREL) 50 MG tablet 90 tablet 0    Sig: TAKE 1 TO 2 TABLETS(50 TO 100 MG) BY MOUTH AT BEDTIME AS NEEDED FOR SLEEP     Psychiatry: Antidepressants - Serotonin Modulator Passed - 04/21/2022 12:11 PM      Passed - Valid encounter within last 6 months    Recent Outpatient Visits           2 weeks ago Pure hypercholesterolemia   [facility], Coralie Keens, NP   8 months ago Encounter for general adult medical examination with abnormal findings   [facility], Coralie Keens, NP   1 year ago Encounter for completion of form with patient   [facility] [place], Coralie Keens, NP   1 year ago Encounter for general adult medical examination with abnormal findings   [facility] [place], Coralie Keens, NP   2 years ago Acute viral conjunctivitis of right eye   [place], NP       Future Appointments             In 5 months Baity, Coralie Keens, NP [facility], [facility]

## 2022-04-23 ENCOUNTER — Other Ambulatory Visit: Payer: Self-pay | Admitting: Internal Medicine

## 2022-04-23 DIAGNOSIS — F5104 Psychophysiologic insomnia: Secondary | ICD-10-CM

## 2022-04-23 NOTE — Telephone Encounter (Signed)
Unable to refill per protocol, Rx request is too soon. Last refill 04/22/22 for 90 days.  Requested Prescriptions  Pending Prescriptions Disp Refills   traZODone (DESYREL) 50 MG tablet [Pharmacy Med Name: TRAZODONE 50MG  TABLETS] 90 tablet 0    Sig: TAKE 1 TO 2 TABLETS(50 TO 100 MG) BY MOUTH AT BEDTIME AS NEEDED FOR SLEEP     Psychiatry: Antidepressants - Serotonin Modulator Passed - 04/23/2022  8:04 AM      Passed - Valid encounter within last 6 months    Recent Outpatient Visits           2 weeks ago Pure hypercholesterolemia   [facility], Coralie Keens, NP   8 months ago Encounter for general adult medical examination with abnormal findings   [facility] [place], Coralie Keens, NP   1 year ago Encounter for completion of form with patient   [facility] [place], Coralie Keens, NP   1 year ago Encounter for general adult medical examination with abnormal findings   [facility] [place], Coralie Keens, NP   2 years ago Acute viral conjunctivitis of right eye   [place], NP       Future Appointments             In 5 months Baity, Coralie Keens, NP [facility], [facility]

## 2022-05-12 ENCOUNTER — Encounter: Payer: Self-pay | Admitting: Oncology

## 2022-05-12 ENCOUNTER — Other Ambulatory Visit: Payer: Self-pay | Admitting: *Deleted

## 2022-05-12 ENCOUNTER — Inpatient Hospital Stay: Payer: Medicare Other | Attending: Oncology | Admitting: Oncology

## 2022-05-12 VITALS — BP 107/64 | HR 66 | Temp 98.8°F | Resp 16 | Ht 67.0 in | Wt 142.0 lb

## 2022-05-12 DIAGNOSIS — D0511 Intraductal carcinoma in situ of right breast: Secondary | ICD-10-CM | POA: Insufficient documentation

## 2022-05-12 DIAGNOSIS — Z853 Personal history of malignant neoplasm of breast: Secondary | ICD-10-CM | POA: Diagnosis not present

## 2022-05-12 DIAGNOSIS — Z87891 Personal history of nicotine dependence: Secondary | ICD-10-CM | POA: Insufficient documentation

## 2022-05-12 DIAGNOSIS — F419 Anxiety disorder, unspecified: Secondary | ICD-10-CM | POA: Diagnosis not present

## 2022-05-12 DIAGNOSIS — Z79899 Other long term (current) drug therapy: Secondary | ICD-10-CM | POA: Diagnosis not present

## 2022-05-12 DIAGNOSIS — Z5181 Encounter for therapeutic drug level monitoring: Secondary | ICD-10-CM

## 2022-05-12 DIAGNOSIS — M8580 Other specified disorders of bone density and structure, unspecified site: Secondary | ICD-10-CM | POA: Insufficient documentation

## 2022-05-12 DIAGNOSIS — Z818 Family history of other mental and behavioral disorders: Secondary | ICD-10-CM | POA: Insufficient documentation

## 2022-05-12 DIAGNOSIS — Z833 Family history of diabetes mellitus: Secondary | ICD-10-CM | POA: Insufficient documentation

## 2022-05-12 DIAGNOSIS — Z08 Encounter for follow-up examination after completed treatment for malignant neoplasm: Secondary | ICD-10-CM

## 2022-05-12 DIAGNOSIS — Z79811 Long term (current) use of aromatase inhibitors: Secondary | ICD-10-CM | POA: Diagnosis not present

## 2022-05-12 DIAGNOSIS — Z9049 Acquired absence of other specified parts of digestive tract: Secondary | ICD-10-CM | POA: Diagnosis not present

## 2022-05-12 DIAGNOSIS — Z803 Family history of malignant neoplasm of breast: Secondary | ICD-10-CM | POA: Insufficient documentation

## 2022-05-12 DIAGNOSIS — Z170 Estrogen receptor positive status [ER+]: Secondary | ICD-10-CM | POA: Diagnosis not present

## 2022-05-12 NOTE — Addendum Note (Signed)
Addended by: Corene Cornea on: 05/12/2022 01:58 PM   Modules accepted: Orders

## 2022-05-12 NOTE — Progress Notes (Signed)
Hematology/Oncology Consult note [facility]  [phone] [phone]  Patient Care Team: Lorre Munroe, NP as PCP - General (Internal Medicine) Jim Like, RN as Oncology Nurse Navigator Creig Hines, MD as Consulting Physician (Oncology) Carmina Miller, MD as Consulting Physician (Radiation Oncology) Campbell Lerner, MD as Consulting Physician (General Surgery)   Name of the patient: Tracie Reed  [id number]  1945/03/07   Date of visit: 05/12/22  Diagnosis-ER positive DCIS  Chief complaint/ Reason for visit-routine follow-up of DCIS on Arimidex  Heme/Onc history: Patient is a 77 year old female who was 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  There was an additional medial margin that was excised which showed an isolated focus of intermediate grade DCIS.  On biopsy the size of DCIS was 4 mm.  Margins negative.  ER greater than 90% positive.  Patient underwent postlumpectomy radiation treatment.  She did start on Arimidex in January 2023, stopped it briefly and then restarted in July 2023.   Interval history-patient took a break from Arimidex for about a month to see if it was contributing to her weight gain.  At the same time she stopped her beer intake also and her weight dropped by 2 pounds..  She then restarted her Arimidex and felt like the weight was coming back up.  She has again stopped her beer intake to figure out if it is the beer that is causing the weight gain versus Arimidex.  Patient reports right upper quadrant pain especially over her rib cage occasionally when she bends or stretches.  ECOG PS- 1 Pain scale- 0   Review of systems- Review of Systems  Constitutional:  Negative for chills, fever, malaise/fatigue and weight loss.  HENT:  Negative for congestion, ear discharge and nosebleeds.   Eyes:   Negative for blurred vision.  Respiratory:  Negative for cough, hemoptysis, sputum production, shortness of breath and wheezing.   Cardiovascular:  Negative for chest pain, palpitations, orthopnea and claudication.  Gastrointestinal:  Negative for abdominal pain, blood in stool, constipation, diarrhea, heartburn, melena, nausea and vomiting.  Genitourinary:  Negative for dysuria, flank pain, frequency, hematuria and urgency.  Musculoskeletal:  Negative for back pain, joint pain and myalgias.  Skin:  Negative for rash.  Neurological:  Negative for dizziness, tingling, focal weakness, seizures, weakness and headaches.  Endo/Heme/Allergies:  Does not bruise/bleed easily.  Psychiatric/Behavioral:  Negative for depression and suicidal ideas. The patient does not have insomnia.       No Known Allergies   Past Medical History:  Diagnosis Date   Anxiety    Breast cancer    DCIS (ductal carcinoma in situ) of breast    History of cataract    Personal history of radiation therapy    Pre-diabetes    last A1C in 2021 was 5.2   Sleep apnea    had uvula removed     Past Surgical History:  Procedure Laterality Date   BREAST BIOPSY Right 09/14/2020   Affirm bx-calcs, "Ribbon" marker-path pending   BREAST EXCISIONAL BIOPSY Right 1993   neg   BREAST LUMPECTOMY Right 10/12/2020   BREAST LUMPECTOMY WITH RADIOFREQUENCY TAG IDENTIFICATION Right 10/12/2020   Procedure: BREAST LUMPECTOMY WITH RADIOFREQUENCY TAG IDENTIFICATION;  Surgeon: Campbell Lerner, MD;  Location: [facility] ORS;  Service: General;  Laterality: Right;   BUNIONECTOMY Bilateral    CATARACT EXTRACTION  09/03/2020   CHOLECYSTECTOMY  COLONOSCOPY WITH PROPOFOL N/A 07/31/2020   Procedure: COLONOSCOPY WITH PROPOFOL;  Surgeon: Midge Minium, MD;  [facility] ENDOSCOPY;  Service: Endoscopy;  Laterality: N/A;   IRIDOTOMY / IRIDECTOMY Right    UVULECTOMY      Social History   Socioeconomic History   Marital status: Significant  Other    Spouse name: Not on file   Number of children: 2   Years of education: Not on file   Highest education level: Bachelor's degree (e.g., BA, AB, BS)  Occupational History   Occupation: self employed    Comment: Chief Operating Officer  Tobacco Use   Smoking status: Former    Packs/day: 0.50    Years: 5.00    Additional pack years: 0.00    Total pack years: 2.50    Types: Cigarettes    Quit date: 11/12/1987    Years since quitting: 34.5    Passive exposure: Past   Smokeless tobacco: Never  Vaping Use   Vaping Use: Never used  Substance and Sexual Activity   Alcohol use: Yes    Comment: 2 pints on weekend beer   Drug use: Not Currently    Comment: past marijuana - very remote use   Sexual activity: Yes  Other Topics Concern   Not on file  Social History Narrative   Not on file   Social Determinants of Health   Financial Resource Strain: Low Risk  (03/28/2022)   Overall Financial Resource Strain (CARDIA)    Difficulty of Paying Living Expenses: Not hard at all  Food Insecurity: No Food Insecurity (03/28/2022)   Hunger Vital Sign    Worried About Running Out of Food in the Last Year: Never true    Ran Out of Food in the Last Year: Never true  Transportation Needs: No Transportation Needs (03/28/2022)   PRAPARE - Administrator, Civil Service (Medical): No    Lack of Transportation (Non-Medical): No  Physical Activity: Sufficiently Active (03/28/2022)   Exercise Vital Sign    Days of Exercise per Week: 3 days    Minutes of Exercise per Session: 60 min  Stress: No Stress Concern Present (03/28/2022)   [employer] of Occupational Health - Occupational Stress Questionnaire    Feeling of Stress : Not at all  Social Connections: Moderately Isolated (03/28/2022)   Social Connection and Isolation Panel [NHANES]    Frequency of Communication with Friends and Family: More than three times a week    Frequency of Social Gatherings with Friends and  Family: More than three times a week    Attends Religious Services: Never    Database administrator or Organizations: No    Attends Banker Meetings: Never    Marital Status: Living with partner  Intimate Partner Violence: Not At Risk (03/28/2022)   Humiliation, Afraid, Rape, and Kick questionnaire    Fear of Current or Ex-Partner: No    Emotionally Abused: No    Physically Abused: No    Sexually Abused: No    Family History  Problem Relation Age of Onset   Breast cancer Mother    Diabetes Mother    Depression Mother    Healthy Brother      Current Outpatient Medications:    acyclovir ointment (ZOVIRAX) 5 %, Apply 1 application topically every 3 (three) hours as needed. (Patient taking differently: Apply 1 application  topically every 3 (three) hours as needed (cold sores).), Disp: 15 g, Rfl: 1   anastrozole (  ARIMIDEX) 1 MG tablet, Take 1 tablet (1 mg total) by mouth daily., Disp: 90 tablet, Rfl: 1   CALCIUM-VITAMIN D PO, Take 2 tablets by mouth daily., Disp: , Rfl:    cetirizine (ZYRTEC) 10 MG tablet, Take 1 tablet (10 mg total) by mouth daily., Disp: 90 tablet, Rfl: 0   diclofenac Sodium (VOLTAREN) 1 % GEL, Apply 1 application topically daily., Disp: , Rfl:    FLUoxetine (PROZAC) 10 MG tablet, TAKE 1 TABLET(10 MG) BY MOUTH DAILY, Disp: 90 tablet, Rfl: 0   ibuprofen (ADVIL) 800 MG tablet, Take 1 tablet (800 mg total) by mouth every 8 (eight) hours as needed., Disp: 30 tablet, Rfl: 0   MILK THISTLE PO, Take 1 capsule by mouth daily., Disp: , Rfl:    mometasone (ELOCON) 0.1 % ointment, Apply topically daily., Disp: 45 g, Rfl: 0   simvastatin (ZOCOR) 10 MG tablet, TAKE 1 TABLET(10 MG) BY MOUTH AT BEDTIME, Disp: 90 tablet, Rfl: 2   traZODone (DESYREL) 50 MG tablet, TAKE 1 TO 2 TABLETS(50 TO 100 MG) BY MOUTH AT BEDTIME AS NEEDED FOR SLEEP, Disp: 90 tablet, Rfl: 0   Turmeric (QC TUMERIC COMPLEX PO), Take 1 Capful by mouth daily., Disp: , Rfl:    valACYclovir (VALTREX) 1000 MG  tablet, TAKE 2 TABLETS AT THE START OF YOUR COLD SORE. TAKE 1 TABLET DAILY UNTIL SORES HAVE RESOLVED., Disp: 30 tablet, Rfl: 1  Physical exam:  Vitals:   05/12/22 1259  BP: 107/64  Pulse: 66  Resp: 16  Temp: 98.8 F (37.1 C)  TempSrc: Tympanic  SpO2: 98%  Weight: 142 lb (64.4 kg)  Height: 5\' 7"  (1.702 m)   Physical Exam Cardiovascular:     Rate and Rhythm: Normal rate and regular rhythm.     Heart sounds: Normal heart sounds.  Pulmonary:     Effort: Pulmonary effort is normal.     Breath sounds: Normal breath sounds.  Abdominal:     General: Bowel sounds are normal.     Palpations: Abdomen is soft.  Skin:    General: Skin is warm and dry.  Neurological:     Mental Status: She is alert and oriented to person, place, and time.    Breast exam was performed in seated and lying down position. Patient is status post right lumpectomy with a well-healed surgical scar. No evidence of any palpable masses. No evidence of axillary adenopathy. No evidence of any palpable masses or lumps in the left breast. No evidence of leftt axillary adenopathy        Latest Ref Rng & Units 04/03/2022    1:51 PM  CMP  Glucose 65 - 99 mg/dL 98   BUN 7 - 25 mg/dL 9   Creatinine 6.19 - 5.09 mg/dL 3.26   Sodium 712 - 458 mmol/L 138   Potassium 3.5 - 5.3 mmol/L 4.4   Chloride 98 - 110 mmol/L 103   CO2 20 - 32 mmol/L 27   Calcium 8.6 - 10.4 mg/dL 9.3   Total Protein 6.1 - 8.1 g/dL 6.6   Total Bilirubin 0.2 - 1.2 mg/dL 0.4   AST 10 - 35 U/L 19   ALT 6 - 29 U/L 14       Latest Ref Rng & Units 04/03/2022    1:51 PM  CBC  WBC 3.8 - 10.8 Thousand/uL 6.4   Hemoglobin 11.7 - 15.5 g/dL 09.9   Hematocrit 83.3 - 45.0 % 39.4   Platelets 140 - 400 Thousand/uL 275  Assessment and plan- Patient is a 77 y.o. female with history of ER positive DCIS currently on Arimidex.  She is here for routine follow-up visit  Clinically patient is doing well with no concerning signs and symptoms of recurrence  based on today's exam.  Patient will continue to take Arimidex for 5 years ideally ending in January 2028.  Her baseline bone density scan showed osteopenia but her 10-year probability of major osteoporotic fracture was less than 20% and hip fracture less than 3%.  Arimidex has not been shown to cause significant weight gain as compared to placebo.  I will see her back in 6 months no labs  Patient is due for her mammogram this year which I will schedule   Visit Diagnosis 1. Encounter for follow-up surveillance of breast cancer   2. Visit for monitoring Arimidex therapy      Dr. Owens Shark, MD, MPH [facility] [phone] 05/12/2022 1:34 PM

## 2022-05-28 ENCOUNTER — Other Ambulatory Visit: Payer: Self-pay | Admitting: Internal Medicine

## 2022-05-28 DIAGNOSIS — F419 Anxiety disorder, unspecified: Secondary | ICD-10-CM

## 2022-05-29 NOTE — Telephone Encounter (Signed)
Requested Prescriptions  Pending Prescriptions Disp Refills   FLUoxetine (PROZAC) 10 MG tablet [Pharmacy Med Name: FLUOXETINE 10MG  TABLETS] 90 tablet 0    Sig: TAKE 1 TABLET(10 MG) BY MOUTH DAILY     Psychiatry:  Antidepressants - SSRI Passed - 05/28/2022 10:33 AM      Passed - Valid encounter within last 6 months    Recent Outpatient Visits           1 month ago Pure hypercholesterolemia   [facility] [place], Salvadore [place], NP   9 months ago Encounter for general adult medical examination with abnormal findings   [place] [facility] [place], Salvadore Oxford, NP   1 year ago Encounter for completion of form with patient   [place] [facility] [place], Salvadore Oxford, NP   1 year ago Encounter for general adult medical examination with abnormal findings   [place] [facility] [place], Salvadore Oxford, NP   2 years ago Acute viral conjunctivitis of right eye   [place] [facility] Gabriel Cirri, NP       Future Appointments             In 4 months Baity, Salvadore [place], NP [place] [facility], [place] [facility]

## 2022-06-08 ENCOUNTER — Other Ambulatory Visit: Payer: Self-pay | Admitting: Internal Medicine

## 2022-06-08 DIAGNOSIS — F5104 Psychophysiologic insomnia: Secondary | ICD-10-CM

## 2022-06-09 MED ORDER — TRAZODONE HCL 50 MG PO TABS: ORAL_TABLET | ORAL | 0 refills | Status: DC

## 2022-06-10 ENCOUNTER — Other Ambulatory Visit: Payer: Self-pay | Admitting: Internal Medicine

## 2022-06-10 DIAGNOSIS — F5104 Psychophysiologic insomnia: Secondary | ICD-10-CM

## 2022-06-10 NOTE — Telephone Encounter (Signed)
Refilled 06/09/22 #180. Requested Prescriptions  Refused Prescriptions Disp Refills   traZODone (DESYREL) 50 MG tablet [Pharmacy Med Name: TRAZODONE 50MG  TABLETS] 180 tablet 0    Sig: TAKE 1 TO 2 TABLETS(50 TO 100 MG) BY MOUTH AT BEDTIME AS NEEDED FOR SLEEP     Psychiatry: Antidepressants - Serotonin Modulator Passed - 06/10/2022  8:04 AM      Passed - Valid encounter within last 6 months    Recent Outpatient Visits           2 months ago Pure hypercholesterolemia   [place] [facility] [place], Salvadore Oxford, NP   9 months ago Encounter for general adult medical examination with abnormal findings   [place] [facility] [place], Salvadore Oxford, NP   1 year ago Encounter for completion of form with patient   [place] [facility] [place], Salvadore Oxford, NP   1 year ago Encounter for general adult medical examination with abnormal findings   [facility] [place], Salvadore Oxford, NP   2 years ago Acute viral conjunctivitis of right eye   [place] [facility] Gabriel Cirri, NP       Future Appointments             In 3 months Baity, Salvadore Oxford, NP [place] [facility], [facility]

## 2022-06-15 ENCOUNTER — Other Ambulatory Visit: Payer: Self-pay | Admitting: Internal Medicine

## 2022-06-15 DIAGNOSIS — R059 Cough, unspecified: Secondary | ICD-10-CM

## 2022-06-17 NOTE — Telephone Encounter (Signed)
Requested Prescriptions  Pending Prescriptions Disp Refills   cetirizine (ZYRTEC) 10 MG tablet [Pharmacy Med Name: CETIRIZINE 10MG  TABLETS] 90 tablet 3    Sig: TAKE 1 TABLET(10 MG) BY MOUTH DAILY     Ear, Nose, and Throat:  Antihistamines 2 Passed - 06/15/2022  4:34 PM      Passed - Cr in normal range and within 360 days    Creat  Date Value Ref Range Status  04/03/2022 0.64 0.60 - 1.00 mg/dL Final         Passed - Valid encounter within last 12 months    Recent Outpatient Visits           2 months ago Pure hypercholesterolemia   [place] [facility] [place], Salvadore Oxford, NP   9 months ago Encounter for general adult medical examination with abnormal findings   [place] [facility] [place], Salvadore Oxford, NP   1 year ago Encounter for completion of form with patient   [place] [facility] [place], Salvadore Oxford, NP   1 year ago Encounter for general adult medical examination with abnormal findings   [place] [facility] [place], Salvadore Oxford, NP   2 years ago Acute viral conjunctivitis of right eye   [place] [facility] Gabriel Cirri, NP       Future Appointments             In 3 months Baity, Salvadore Oxford, NP [place] [facility] [place], [facility]

## 2022-06-25 ENCOUNTER — Other Ambulatory Visit: Payer: Self-pay | Admitting: Internal Medicine

## 2022-06-25 NOTE — Telephone Encounter (Signed)
Requested Prescriptions  Pending Prescriptions Disp Refills   simvastatin (ZOCOR) 10 MG tablet [Pharmacy Med Name: SIMVASTATIN 10MG  TABLETS] 90 tablet 1    Sig: TAKE 1 TABLET(10 MG) BY MOUTH AT BEDTIME     Cardiovascular:  Antilipid - Statins Failed - 06/25/2022  8:04 AM      Failed - Lipid Panel in normal range within the last 12 months    Cholesterol  Date Value Ref Range Status  04/03/2022 164 <200 mg/dL Final   LDL Cholesterol (Calc)  Date Value Ref Range Status  04/03/2022 77 mg/dL (calc) Final    Comment:    Reference range: <100 . Desirable range <100 mg/dL for primary prevention;   <70 mg/dL for patients with CHD or diabetic patients  with > or = 2 CHD risk factors. Marland Kitchen LDL-C is now calculated using the Martin-Hopkins  calculation, which is a validated novel method providing  better accuracy than the Friedewald equation in the  estimation of LDL-C.  Horald Pollen et al. Lenox Ahr. 4782;956(21): 2061-2068  (http://education.[employer].com/faq/FAQ164)    HDL  Date Value Ref Range Status  04/03/2022 69 > OR = 50 mg/dL Final   Triglycerides  Date Value Ref Range Status  04/03/2022 99 <150 mg/dL Final         Passed - Patient is not pregnant      Passed - Valid encounter within last 12 months    Recent Outpatient Visits           2 months ago Pure hypercholesterolemia   [place] [facility] [place], Salvadore Oxford, NP   10 months ago Encounter for general adult medical examination with abnormal findings   [place] [facility] [place], Salvadore Oxford, NP   1 year ago Encounter for completion of form with patient   [place] [facility] [place], Salvadore Oxford, NP   1 year ago Encounter for general adult medical examination with abnormal findings   [place] [facility] [place], Salvadore Oxford, NP   2 years ago Acute viral conjunctivitis of right eye   [place] [facility] Gabriel Cirri, NP        Future Appointments             In 3 months Baity, Salvadore Oxford, NP [place] [facility], [facility]

## 2022-08-21 DIAGNOSIS — K080 Exfoliation of teeth due to systemic causes: Secondary | ICD-10-CM | POA: Diagnosis not present

## 2022-08-25 ENCOUNTER — Other Ambulatory Visit: Payer: Self-pay | Admitting: Internal Medicine

## 2022-08-25 DIAGNOSIS — F419 Anxiety disorder, unspecified: Secondary | ICD-10-CM

## 2022-08-26 NOTE — Telephone Encounter (Signed)
Requested Prescriptions  Pending Prescriptions Disp Refills   FLUoxetine (PROZAC) 10 MG tablet [Pharmacy Med Name: FLUOXETINE 10MG  TABLETS] 90 tablet 0    Sig: TAKE 1 TABLET(10 MG) BY MOUTH DAILY     Psychiatry:  Antidepressants - SSRI Passed - 08/25/2022  5:29 PM      Passed - Valid encounter within last 6 months    Recent Outpatient Visits           4 months ago Pure hypercholesterolemia   [place] [facility] [place], Salvadore Oxford, NP   1 year ago Encounter for general adult medical examination with abnormal findings   [place] [facility] [place], Salvadore Oxford, NP   2 years ago Encounter for completion of form with patient   [place] [facility] [place], Salvadore Oxford, NP   2 years ago Encounter for general adult medical examination with abnormal findings   [place] [facility] [place], Salvadore Oxford, NP   2 years ago Acute viral conjunctivitis of right eye   [place] [facility] Gabriel Cirri, NP       Future Appointments             In 1 month [place], Salvadore Oxford, NP [place] [facility], [facility]

## 2022-08-28 ENCOUNTER — Other Ambulatory Visit: Payer: Self-pay | Admitting: Internal Medicine

## 2022-08-28 DIAGNOSIS — F419 Anxiety disorder, unspecified: Secondary | ICD-10-CM

## 2022-08-28 NOTE — Telephone Encounter (Signed)
Requested Prescriptions  Refused Prescriptions Disp Refills   FLUoxetine (PROZAC) 10 MG tablet [Pharmacy Med Name: FLUOXETINE 10MG  TABLETS] 90 tablet 0    Sig: TAKE 1 TABLET(10 MG) BY MOUTH DAILY     Psychiatry:  Antidepressants - SSRI Passed - 08/28/2022  8:04 AM      Passed - Valid encounter within last 6 months    Recent Outpatient Visits           4 months ago Pure hypercholesterolemia   [place] [facility] [place], Salvadore Oxford, NP   1 year ago Encounter for general adult medical examination with abnormal findings   [place] [facility] [place], Salvadore Oxford, NP   2 years ago Encounter for completion of form with patient   [place] [facility] [place], Salvadore Oxford, NP   2 years ago Encounter for general adult medical examination with abnormal findings   [place] [facility] [place], Salvadore Oxford, NP   2 years ago Acute viral conjunctivitis of right eye   [place] [facility] Gabriel Cirri, NP       Future Appointments             In 1 month [place], Salvadore Oxford, NP [place] [facility], [facility]

## 2022-08-29 ENCOUNTER — Ambulatory Visit
Admission: RE | Admit: 2022-08-29 | Discharge: 2022-08-29 | Disposition: A | Discharge status: Home / Self Care | Payer: Medicare Other | Source: Ambulatory Visit | Attending: Oncology | Admitting: Oncology

## 2022-08-29 ENCOUNTER — Ambulatory Visit: Admission: RE | Admit: 2022-08-29 | Payer: Medicare Other | Source: Ambulatory Visit

## 2022-08-29 DIAGNOSIS — Z853 Personal history of malignant neoplasm of breast: Secondary | ICD-10-CM | POA: Diagnosis not present

## 2022-08-29 DIAGNOSIS — Z5181 Encounter for therapeutic drug level monitoring: Secondary | ICD-10-CM | POA: Diagnosis not present

## 2022-08-29 DIAGNOSIS — R92333 Mammographic heterogeneous density, bilateral breasts: Secondary | ICD-10-CM | POA: Diagnosis not present

## 2022-08-29 DIAGNOSIS — Z08 Encounter for follow-up examination after completed treatment for malignant neoplasm: Secondary | ICD-10-CM

## 2022-08-29 DIAGNOSIS — Z79811 Long term (current) use of aromatase inhibitors: Secondary | ICD-10-CM | POA: Diagnosis not present

## 2022-09-08 ENCOUNTER — Other Ambulatory Visit: Payer: Self-pay | Admitting: Internal Medicine

## 2022-09-08 DIAGNOSIS — F5104 Psychophysiologic insomnia: Secondary | ICD-10-CM

## 2022-09-09 ENCOUNTER — Encounter: Payer: Self-pay | Admitting: Surgery

## 2022-09-09 ENCOUNTER — Ambulatory Visit: Payer: Medicare Other | Admitting: Surgery

## 2022-09-09 VITALS — BP 126/80 | HR 102 | Temp 98.0°F | Ht 67.0 in | Wt 132.8 lb

## 2022-09-09 DIAGNOSIS — D0511 Intraductal carcinoma in situ of right breast: Secondary | ICD-10-CM

## 2022-09-09 MED ORDER — TRAZODONE HCL 50 MG PO TABS: ORAL_TABLET | ORAL | 0 refills | Status: DC

## 2022-09-09 NOTE — Progress Notes (Signed)
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  About 1 year follow the last evaluation.   Post XRT, she continues to take her Arimidex without issue.  She reports she is tolerating it well.   Review of Systems:  Constitutional: denies fever/chills  ENT: denies sore throat, hearing problems  Respiratory: denies shortness of breath, wheezing  Cardiovascular: denies chest pain, palpitations  Gastrointestinal: denies abdominal pain, N/V, or diarrhea/and bowel function as per interval history Skin: Denies any other rashes or skin discolorations except post-surgical wounds as per interval history  Vital Signs:  BP 126/80   Pulse (!) 102   Temp 98 F (36.7 C)   Ht 5\' 7"  (1.702 m)   Wt 132 lb 12.8 oz (60.2 kg)   SpO2 97%   BMI 20.80 kg/m    Physical Exam:  Constitutional:  -- Normal body habitus  -- Awake, alert, and oriented x3  Pulmonary:  -- Breathing non-labored at rest Cardiovascular:  -- Well perfused Gastrointestinal:  -- Soft and non-distended, non-tender GU  --Caryl Lyn present as chaperone.  Right breast evaluated, lateral scar appears to be well-healed.  Minimal radiation changes appreciated.  No other suspicious, nor dominant nodularity present.  No appreciable skin changes.  The left breast remains unremarkable, no nodularity appreciable. 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   EXAM: DIGITAL DIAGNOSTIC BILATERAL MAMMOGRAM WITH TOMOSYNTHESIS AND CAD   TECHNIQUE: Bilateral digital diagnostic mammography and breast tomosynthesis was performed. The images were evaluated with computer-aided detection.   COMPARISON:  Previous exam(s).   ACR Breast  Density Category c: The breasts are heterogeneously dense, which may obscure small masses.   FINDINGS: Post operative changes are seen in the RIGHTbreast. No suspicious mass, distortion, or microcalcifications are identified to suggest presence of malignancy.   IMPRESSION: No mammographic evidence for malignancy.   RECOMMENDATION: Diagnostic mammogram is suggested in 1 year. (Code:[id number]-01Y)   I have discussed the findings and recommendations with the patient. If applicable, a reminder letter will be sent to the patient regarding the next appointment.   BI-RADS CATEGORY  2: Benign.     Electronically Signed   By: Norva Pavlov M.D.   On: 08/29/2022 13:08 Assessment:  77 y.o. yo Female with a problem list including...  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  Plan:              - return to clinic in 12 months or as needed, instructed to call office if any questions or concerns.    It appears her next set of imaging will be diagnostic, bilateral in August of 2025.  All of the above recommendations were discussed with the patient, and all of patient'squestions were answered to her expressed satisfaction.  These notes generated with voice recognition software. I apologize for typographical  errors.  Campbell Lerner, MD, FACS [place]: [facility] General [facility] - Partnering for exceptional care. Office: [phone]

## 2022-09-09 NOTE — Patient Instructions (Signed)
The patient has been asked to return to the office in one year with a bilateral diagnostic mammogram. We will send you a letter about these appointments.   Continue self breast exams. Call office for any new breast issues or concerns.   Breast Self-Awareness Breast self-awareness means being familiar with how your breasts look and feel. It involves checking your breasts regularly and telling your health care provider about any changes. Practicing breast self-awareness helps to maintain breast health. Sometimes, changes are not harmful (are benign). Other times, a change in your breasts can be a sign of a serious medical problem. Being familiar with the look and feel of your breasts can help you catch a breast problem while it is still small and can be treated. You should do breast self-exams even if you have breast implants. What you need: A mirror. A well-lit room. A pillow or other soft object. How to do a breast self-exam A breast self-exam is one way to learn what is normal for your breasts and whether your breasts are changing. To do a breast self-exam: Look for changes  Remove all the clothing above your waist. Stand in front of a mirror in a room with good lighting. Put your hands down at your sides. Compare your breasts in the mirror. Look for differences between them (asymmetry), such as: Differences in shape. Differences in size. Puckers, dips, and bumps in one breast and not the other. Look at each breast for changes in the skin, such as: Redness. Scaly areas. Skin thickening. Dimpling. Open sores (ulcers). Look for changes in your nipples, such as: Discharge. Bleeding. Dimpling. Redness. A nipple that looks pushed in (retracted), or that has changed position. Feel for changes Carefully feel your breasts for lumps and changes. It is best to do this self-exam while lying down. Follow these steps to feel each breast: Place a pillow under the shoulder of one side of your  body. Place the arm of that side of your body behind your head. Feel the breast of that side of your body using the hand of the opposite arm. To do this: Start in the nipple area and use the pads of your three middle fingers to make -inch (2 cm) overlapping circles. Use light, medium, and then firm pressure as you feel your breast, gently covering the entire breast area and armpit. Continue the overlapping circles, moving downward over the breast until you feel your ribs below your breast. Then, make circles with your fingers going upward until you reach your collarbone. Next, make circles by moving outward across your breast and into your armpit area. Squeeze the nipple. Check for discharge and lumps. Repeat steps 1-7 to check your other breast. Sit or stand in the tub or shower. With soapy water on your skin, feel each breast the same way you did when you were lying down. Write down what you find Writing down what you find can help you remember what to discuss with your health care provider. Write down: What is normal for each breast. Any changes that you find in each breast. These include: The kind of changes you find. Any pain or tenderness. Size and location of any lumps. Where you are in your menstrual cycle, if you are still getting your menstrual period (menstruating). General tips If you are breastfeeding, the best time to examine your breasts is after a feeding or after using a breast pump. If you menstruate, the best time to examine your breasts is 5-7 days after  your menstrual period. Breasts are generally lumpier during menstrual periods, and it may be more difficult to notice changes. With time and practice, you will become more familiar with the differences in your breasts and more comfortable with the exam. Contact a health care provider if: You see a change in the shape or size of your breasts or nipples. You see a change in the skin of your breast or nipples, such as a  reddened or scaly area. You have unusual discharge from your nipples. You find a new lump or thick area. You have breast pain. You have any concerns about your breast health. Summary Breast self-awareness includes looking for physical changes in your breasts and feeling for any changes within your breasts. Breast self-awareness should be done in front of a mirror in a well-lit room. If you menstruate, the best time to examine your breasts is 5-7 days after your menstrual period. Tell your health care provider about any changes you notice in your breasts. Changes include changes in size, changes on the skin, pain or tenderness, or unusual fluid from your nipples. This information is not intended to replace advice given to you by your health care provider. Make sure you discuss any questions you have with your health care provider. Document Revised: 06/20/2021 Document Reviewed: 11/15/2020 Elsevier Patient Education  2024 [employer].

## 2022-09-16 DIAGNOSIS — H26493 Other secondary cataract, bilateral: Secondary | ICD-10-CM | POA: Diagnosis not present

## 2022-09-16 DIAGNOSIS — H40003 Preglaucoma, unspecified, bilateral: Secondary | ICD-10-CM | POA: Diagnosis not present

## 2022-09-16 DIAGNOSIS — H524 Presbyopia: Secondary | ICD-10-CM | POA: Diagnosis not present

## 2022-09-21 ENCOUNTER — Other Ambulatory Visit: Payer: Self-pay | Admitting: Oncology

## 2022-10-07 ENCOUNTER — Encounter: Payer: Self-pay | Admitting: Internal Medicine

## 2022-10-07 ENCOUNTER — Ambulatory Visit (INDEPENDENT_AMBULATORY_CARE_PROVIDER_SITE_OTHER): Payer: Medicare Other | Admitting: Internal Medicine

## 2022-10-07 VITALS — BP 116/72 | HR 75 | Temp 96.9°F | Wt 134.0 lb

## 2022-10-07 DIAGNOSIS — Z0001 Encounter for general adult medical examination with abnormal findings: Secondary | ICD-10-CM

## 2022-10-07 DIAGNOSIS — E7800 Pure hypercholesterolemia, unspecified: Secondary | ICD-10-CM

## 2022-10-07 DIAGNOSIS — F5104 Psychophysiologic insomnia: Secondary | ICD-10-CM

## 2022-10-07 DIAGNOSIS — R7309 Other abnormal glucose: Secondary | ICD-10-CM

## 2022-10-07 MED ORDER — TRAZODONE HCL 50 MG PO TABS: 100.0000 mg | ORAL_TABLET | Freq: Every day | ORAL | 1 refills | Status: DC

## 2022-10-07 NOTE — Patient Instructions (Signed)
Health Maintenance for Postmenopausal Women Menopause is a normal process in which your ability to get pregnant comes to an end. This process happens slowly over many months or years, usually between the ages of 48 and 55. Menopause is complete when you have missed your menstrual period for 12 months. It is important to talk with your health care provider about some of the most common conditions that affect women after menopause (postmenopausal women). These include heart disease, cancer, and bone loss (osteoporosis). Adopting a healthy lifestyle and getting preventive care can help to promote your health and wellness. The actions you take can also lower your chances of developing some of these common conditions. What are the signs and symptoms of menopause? During menopause, you may have the following symptoms: Hot flashes. These can be moderate or severe. Night sweats. Decrease in sex drive. Mood swings. Headaches. Tiredness (fatigue). Irritability. Memory problems. Problems falling asleep or staying asleep. Talk with your health care provider about treatment options for your symptoms. Do I need hormone replacement therapy? Hormone replacement therapy is effective in treating symptoms that are caused by menopause, such as hot flashes and night sweats. Hormone replacement carries certain risks, especially as you become older. If you are thinking about using estrogen or estrogen with progestin, discuss the benefits and risks with your health care provider. How can I reduce my risk for heart disease and stroke? The risk of heart disease, heart attack, and stroke increases as you age. One of the causes may be a change in the body's hormones during menopause. This can affect how your body uses dietary fats, triglycerides, and cholesterol. Heart attack and stroke are medical emergencies. There are many things that you can do to help prevent heart disease and stroke. Watch your blood pressure High  blood pressure causes heart disease and increases the risk of stroke. This is more likely to develop in people who have high blood pressure readings or are overweight. Have your blood pressure checked: Every 3-5 years if you are 18-39 years of age. Every year if you are 40 years old or older. Eat a healthy diet  Eat a diet that includes plenty of vegetables, fruits, low-fat dairy products, and lean protein. Do not eat a lot of foods that are high in solid fats, added sugars, or sodium. Get regular exercise Get regular exercise. This is one of the most important things you can do for your health. Most adults should: Try to exercise for at least 150 minutes each week. The exercise should increase your heart rate and make you sweat (moderate-intensity exercise). Try to do strengthening exercises at least twice each week. Do these in addition to the moderate-intensity exercise. Spend less time sitting. Even light physical activity can be beneficial. Other tips Work with your health care provider to achieve or maintain a healthy weight. Do not use any products that contain nicotine or tobacco. These products include cigarettes, chewing tobacco, and vaping devices, such as e-cigarettes. If you need help quitting, ask your health care provider. Know your numbers. Ask your health care provider to check your cholesterol and your blood sugar (glucose). Continue to have your blood tested as directed by your health care provider. Do I need screening for cancer? Depending on your health history and family history, you may need to have cancer screenings at different stages of your life. This may include screening for: Breast cancer. Cervical cancer. Lung cancer. Colorectal cancer. What is my risk for osteoporosis? After menopause, you may be   at increased risk for osteoporosis. Osteoporosis is a condition in which bone destruction happens more quickly than new bone creation. To help prevent osteoporosis or  the bone fractures that can happen because of osteoporosis, you may take the following actions: If you are 19-50 years old, get at least 1,000 mg of calcium and at least 600 international units (IU) of vitamin D per day. If you are older than age 50 but younger than age 70, get at least 1,200 mg of calcium and at least 600 international units (IU) of vitamin D per day. If you are older than age 70, get at least 1,200 mg of calcium and at least 800 international units (IU) of vitamin D per day. Smoking and drinking excessive alcohol increase the risk of osteoporosis. Eat foods that are rich in calcium and vitamin D, and do weight-bearing exercises several times each week as directed by your health care provider. How does menopause affect my mental health? Depression may occur at any age, but it is more common as you become older. Common symptoms of depression include: Feeling depressed. Changes in sleep patterns. Changes in appetite or eating patterns. Feeling an overall lack of motivation or enjoyment of activities that you previously enjoyed. Frequent crying spells. Talk with your health care provider if you think that you are experiencing any of these symptoms. General instructions See your health care provider for regular wellness exams and vaccines. This may include: Scheduling regular health, dental, and eye exams. Getting and maintaining your vaccines. These include: Influenza vaccine. Get this vaccine each year before the flu season begins. Pneumonia vaccine. Shingles vaccine. Tetanus, diphtheria, and pertussis (Tdap) booster vaccine. Your health care provider may also recommend other immunizations. Tell your health care provider if you have ever been abused or do not feel safe at home. Summary Menopause is a normal process in which your ability to get pregnant comes to an end. This condition causes hot flashes, night sweats, decreased interest in sex, mood swings, headaches, or lack  of sleep. Treatment for this condition may include hormone replacement therapy. Take actions to keep yourself healthy, including exercising regularly, eating a healthy diet, watching your weight, and checking your blood pressure and blood sugar levels. Get screened for cancer and depression. Make sure that you are up to date with all your vaccines. This information is not intended to replace advice given to you by your health care provider. Make sure you discuss any questions you have with your health care provider. Document Revised: 06/04/2020 Document Reviewed: 06/04/2020 Elsevier Patient Education  2024 Elsevier Inc.  

## 2022-10-07 NOTE — Progress Notes (Signed)
Subjective:    Patient ID: Tracie Reed, female    DOB: [birth year], 77 y.o.   MRN: [id number]  HPI  Patient presents to clinic today for her annual exam.  Flu: 09/2020 Tetanus: unsure COVID: X 6 Pneumovax: 10/2020 Prevnar: 12/2018 Shingrix: 11/2019, 10/2020 Pap smear: No longer screening Mammogram: 08/2022 Bone density: 08/2021 Colon screening: 07/2020 Vision screening: annually Dentist: biannually  Diet: She does not eat meat. She consumes fruits and veggies. She does not eat fried foods. She drinks mostly carbonated water, coffee Exercise: resistance bands and stretching, walking   Review of Systems     Past Medical History:  Diagnosis Date   Anxiety    Breast cancer (HCC)    DCIS (ductal carcinoma in situ) of breast    History of cataract    Personal history of radiation therapy    Pre-diabetes    last A1C in 2021 was 5.2   Sleep apnea    had uvula removed    Current Outpatient Medications  Medication Sig Dispense Refill   acyclovir ointment (ZOVIRAX) 5 % Apply 1 application topically every 3 (three) hours as needed. (Patient taking differently: Apply 1 application  topically every 3 (three) hours as needed (cold sores).) 15 g 1   anastrozole (ARIMIDEX) 1 MG tablet TAKE 1 TABLET BY MOUTH EVERY DAY 90 tablet 3   CALCIUM-VITAMIN D PO Take 2 tablets by mouth daily.     cetirizine (ZYRTEC) 10 MG tablet TAKE 1 TABLET(10 MG) BY MOUTH DAILY 90 tablet 3   diclofenac Sodium (VOLTAREN) 1 % GEL Apply 1 application topically daily.     FLUoxetine (PROZAC) 10 MG tablet TAKE 1 TABLET(10 MG) BY MOUTH DAILY 90 tablet 0   ibuprofen (ADVIL) 800 MG tablet Take 1 tablet (800 mg total) by mouth every 8 (eight) hours as needed. 30 tablet 0   MILK THISTLE PO Take 1 capsule by mouth daily.     mometasone (ELOCON) 0.1 % ointment Apply topically daily. 45 g 0   simvastatin (ZOCOR) 10 MG tablet TAKE 1 TABLET(10 MG) BY MOUTH AT BEDTIME 90 tablet 1   traZODone (DESYREL) 50 MG tablet TAKE  1 TO 2 TABLETS(50 TO 100 MG) BY MOUTH AT BEDTIME AS NEEDED FOR SLEEP 180 tablet 0   Turmeric (QC TUMERIC COMPLEX PO) Take 1 Capful by mouth daily.     valACYclovir (VALTREX) 1000 MG tablet TAKE 2 TABLETS AT THE START OF YOUR COLD SORE. TAKE 1 TABLET DAILY UNTIL SORES HAVE RESOLVED. 30 tablet 1   No current facility-administered medications for this visit.    No Known Allergies  Family History  Problem Relation Age of Onset   Breast cancer Mother    Diabetes Mother    Depression Mother    Healthy Brother     Social History   Socioeconomic History   Marital status: Significant Other    Spouse name: Not on file   Number of children: 2   Years of education: Not on file   Highest education level: Bachelor's degree (e.g., BA, AB, BS)  Occupational History   Occupation: self employed    Comment: Chief Operating Officer  Tobacco Use   Smoking status: Former    Current packs/day: 0.00    Average packs/day: 0.5 packs/day for 5.0 years (2.5 ttl pk-yrs)    Types: Cigarettes    Start date: 11/12/1982    Quit date: 11/12/1987    Years since quitting: 34.9    Passive exposure: Past  Smokeless tobacco: Never  Vaping Use   Vaping status: Never Used  Substance and Sexual Activity   Alcohol use: Yes    Comment: 2 pints on weekend beer   Drug use: Not Currently    Comment: past marijuana - very remote use   Sexual activity: Yes  Other Topics Concern   Not on file  Social History Narrative   Not on file   Social Determinants of Health   Financial Resource Strain: Low Risk  (03/28/2022)   Overall Financial Resource Strain (CARDIA)    Difficulty of Paying Living Expenses: Not hard at all  Food Insecurity: No Food Insecurity (03/28/2022)   Hunger Vital Sign    Worried About Running Out of Food in the Last Year: Never true    Ran Out of Food in the Last Year: Never true  Transportation Needs: No Transportation Needs (03/28/2022)   PRAPARE - Scientist, research (physical sciences) (Medical): No    Lack of Transportation (Non-Medical): No  Physical Activity: Sufficiently Active (03/28/2022)   Exercise Vital Sign    Days of Exercise per Week: 3 days    Minutes of Exercise per Session: 60 min  Stress: No Stress Concern Present (03/28/2022)   [employer] of Occupational Health - Occupational Stress Questionnaire    Feeling of Stress : Not at all  Social Connections: Moderately Isolated (03/28/2022)   Social Connection and Isolation Panel [NHANES]    Frequency of Communication with Friends and Family: More than three times a week    Frequency of Social Gatherings with Friends and Family: More than three times a week    Attends Religious Services: Never    Database administrator or Organizations: No    Attends Banker Meetings: Never    Marital Status: Living with partner  Intimate Partner Violence: Not At Risk (03/28/2022)   Humiliation, Afraid, Rape, and Kick questionnaire    Fear of Current or Ex-Partner: No    Emotionally Abused: No    Physically Abused: No    Sexually Abused: No     Constitutional: Denies fever, malaise, fatigue, headache or abrupt weight changes.  HEENT: Denies eye pain, eye redness, ear pain, ringing in the ears, wax buildup, runny nose, nasal congestion, bloody nose, or sore throat. Respiratory: Denies difficulty breathing, shortness of breath, cough or sputum production.   Cardiovascular: Denies chest pain, chest tightness, palpitations or swelling in the hands or feet.  Gastrointestinal: Pt reports intermittent constipation. Denies abdominal pain, bloating, diarrhea or blood in the stool.  GU: Denies urgency, frequency, pain with urination, burning sensation, blood in urine, odor or discharge. Musculoskeletal: Denies decrease in range of motion, difficulty with gait, muscle pain or joint pain and swelling.  Skin: Denies redness, rashes, lesions or ulcercations.  Neurological: Patient reports insomnia.  Denies  dizziness, difficulty with memory, difficulty with speech or problems with balance and coordination.  Psych: Patient has a history of anxiety.  Denies depression, SI/HI.  No other specific complaints in a complete review of systems (except as listed in HPI above).  Objective:   Physical Exam  BP 116/72 (BP Location: Right Arm, Patient Position: Sitting, Cuff Size: Normal)   Pulse 75   Temp (!) 96.9 F (36.1 C) (Temporal)   Wt 134 lb (60.8 kg)   SpO2 100%   BMI 20.99 kg/m   Wt Readings from Last 3 Encounters:  09/09/22 132 lb 12.8 oz (60.2 kg)  05/12/22 142 lb (64.4 kg)  04/03/22  146 lb (66.2 kg)    General: Appears her stated age, well developed, well nourished in NAD. Skin: Warm, dry and intact. HEENT: Head: normal shape and size; Eyes: sclera white, no icterus, conjunctiva pink, PERRLA and EOMs intact;  Neck:  Neck supple, trachea midline. No masses, lumps or thyromegaly present.  Cardiovascular: Normal rate and rhythm. S1,S2 noted.  No murmur, rubs or gallops noted. No JVD or BLE edema. No carotid bruits noted. Pulmonary/Chest: Normal effort and positive vesicular breath sounds. No respiratory distress. No wheezes, rales or ronchi noted.  Abdomen: Soft and nontender. Normal bowel sounds.  Musculoskeletal:Strength 5/5 BUE/BLE.No difficulty with gait.  Neurological: Alert and oriented. Cranial nerves II-XII grossly intact. Coordination normal.  Psychiatric: Mood and affect normal. Behavior is normal. Judgment and thought content normal.    BMET    Component Value Date/Time   NA 138 04/03/2022 1351   K 4.4 04/03/2022 1351   CL 103 04/03/2022 1351   CO2 27 04/03/2022 1351   GLUCOSE 98 04/03/2022 1351   BUN 9 04/03/2022 1351   CREATININE 0.64 04/03/2022 1351   CALCIUM 9.3 04/03/2022 1351   GFRNONAA >60 10/09/2020 1143   GFRNONAA 86 07/19/2020 1026   GFRAA 100 07/19/2020 1026    Lipid Panel     Component Value Date/Time   CHOL 164 04/03/2022 1351   TRIG 99  04/03/2022 1351   HDL 69 04/03/2022 1351   CHOLHDL 2.4 04/03/2022 1351   LDLCALC 77 04/03/2022 1351    CBC    Component Value Date/Time   WBC 6.4 04/03/2022 1351   RBC 4.38 04/03/2022 1351   HGB 13.5 04/03/2022 1351   HCT 39.4 04/03/2022 1351   PLT 275 04/03/2022 1351   MCV 90.0 04/03/2022 1351   MCH 30.8 04/03/2022 1351   MCHC 34.3 04/03/2022 1351   RDW 13.8 04/03/2022 1351   LYMPHSABS 2.0 10/09/2020 1143   MONOABS 0.5 10/09/2020 1143   EOSABS 0.1 10/09/2020 1143   BASOSABS 0.0 10/09/2020 1143    Hgb A1C Lab Results  Component Value Date   HGBA1C 5.2 07/05/2019            Assessment & Plan:   Preventative health maintenance:  Flu declined, will get at pharmacy She declines tetanus for financial reasons, advised if she gets better To go get this done Encouraged her to get her COVID-vaccine Pneumovax and Prevnar UTD Shingrix UTD She no longer needs to screen for cervical cancer Mammogram UTD Bone density UTD She no longer needs to screen for colon cancer Encouraged her to consume a balanced diet and exercise regimen We will check CBC, c-Met, lipid, A1c today  RTC in 6 months, follow-up chronic conditions Nicki Reaper, NP

## 2022-10-08 LAB — LIPID PANEL
Cholesterol: 152 mg/dL (ref <200)
HDL: 51 mg/dL (ref >=50)
LDL Cholesterol (Calc): 75 mg/dL
Non-HDL Cholesterol (Calc): 101 mg/dL (ref <130)
Total CHOL/HDL Ratio: 3 (calc) (ref <5.0)
Triglycerides: 166 mg/dL — ABNORMAL HIGH (ref <150)

## 2022-10-08 LAB — CBC
HCT: 41.9 % (ref 35.0–45.0)
Hemoglobin: 14 g/dL (ref 11.7–15.5)
MCH: 30.3 pg (ref 27.0–33.0)
MCHC: 33.4 g/dL (ref 32.0–36.0)
MCV: 90.7 fL (ref 80.0–100.0)
MPV: 11.3 fL (ref 7.5–12.5)
Platelets: 250 10*3/uL (ref 140–400)
RBC: 4.62 10*6/uL (ref 3.80–5.10)
RDW: 13 % (ref 11.0–15.0)
WBC: 7.2 10*3/uL (ref 3.8–10.8)

## 2022-10-08 LAB — COMPLETE METABOLIC PANEL WITH GFR
AG Ratio: 2.4 (calc) (ref 1.0–2.5)
ALT: 18 U/L (ref 6–29)
AST: 20 U/L (ref 10–35)
Albumin: 4.7 g/dL (ref 3.6–5.1)
Alkaline phosphatase (APISO): 65 U/L (ref 37–153)
BUN: 7 mg/dL (ref 7–25)
CO2: 28 mmol/L (ref 20–32)
Calcium: 9.6 mg/dL (ref 8.6–10.4)
Chloride: 104 mmol/L (ref 98–110)
Creat: 0.72 mg/dL (ref 0.60–1.00)
Globulin: 2 g/dL (ref 1.9–3.7)
Glucose, Bld: 89 mg/dL (ref 65–139)
Potassium: 4.5 mmol/L (ref 3.5–5.3)
Sodium: 139 mmol/L (ref 135–146)
Total Bilirubin: 0.5 mg/dL (ref 0.2–1.2)
Total Protein: 6.7 g/dL (ref 6.1–8.1)
eGFR: 86 mL/min/{1.73_m2} (ref >=60)

## 2022-10-08 LAB — HEMOGLOBIN A1C
Hgb A1c MFr Bld: 5.8 %{Hb} — ABNORMAL HIGH (ref <5.7)
Mean Plasma Glucose: 120 mg/dL
eAG (mmol/L): 6.6 mmol/L

## 2022-11-11 ENCOUNTER — Inpatient Hospital Stay: Payer: Medicare Other | Attending: Oncology | Admitting: Oncology

## 2022-11-11 ENCOUNTER — Encounter: Payer: Self-pay | Admitting: Oncology

## 2022-11-11 VITALS — BP 122/84 | HR 66 | Temp 96.6°F | Resp 18 | Ht 67.0 in | Wt 134.9 lb

## 2022-11-11 DIAGNOSIS — Z79811 Long term (current) use of aromatase inhibitors: Secondary | ICD-10-CM | POA: Diagnosis not present

## 2022-11-11 DIAGNOSIS — Z79899 Other long term (current) drug therapy: Secondary | ICD-10-CM | POA: Insufficient documentation

## 2022-11-11 DIAGNOSIS — Z5181 Encounter for therapeutic drug level monitoring: Secondary | ICD-10-CM

## 2022-11-11 DIAGNOSIS — Z9049 Acquired absence of other specified parts of digestive tract: Secondary | ICD-10-CM | POA: Diagnosis not present

## 2022-11-11 DIAGNOSIS — G4730 Sleep apnea, unspecified: Secondary | ICD-10-CM | POA: Insufficient documentation

## 2022-11-11 DIAGNOSIS — Z818 Family history of other mental and behavioral disorders: Secondary | ICD-10-CM | POA: Diagnosis not present

## 2022-11-11 DIAGNOSIS — F419 Anxiety disorder, unspecified: Secondary | ICD-10-CM | POA: Insufficient documentation

## 2022-11-11 DIAGNOSIS — Z803 Family history of malignant neoplasm of breast: Secondary | ICD-10-CM | POA: Insufficient documentation

## 2022-11-11 DIAGNOSIS — Z170 Estrogen receptor positive status [ER+]: Secondary | ICD-10-CM | POA: Insufficient documentation

## 2022-11-11 DIAGNOSIS — Z87891 Personal history of nicotine dependence: Secondary | ICD-10-CM | POA: Insufficient documentation

## 2022-11-11 DIAGNOSIS — D0511 Intraductal carcinoma in situ of right breast: Secondary | ICD-10-CM | POA: Diagnosis not present

## 2022-11-11 DIAGNOSIS — Z833 Family history of diabetes mellitus: Secondary | ICD-10-CM | POA: Diagnosis not present

## 2022-11-11 NOTE — Progress Notes (Signed)
Hematology/Oncology Consult note [facility]  [phone] [phone]  Patient Care Team: Lorre Munroe, NP as PCP - General (Internal Medicine) Jim Like, RN as Oncology Nurse Navigator Creig Hines, MD as Consulting Physician (Oncology) Carmina Miller, MD as Consulting Physician (Radiation Oncology) Campbell Lerner, MD as Consulting Physician (General Surgery)   Name of the patient: Tracie Reed  [id number]  07/22/1945   Date of visit: 11/11/22  Diagnosis-history of ER positive DCIS  Chief complaint/ Reason for visit-routine follow-up of DCIS on Arimidex  Heme/Onc history: Patient is a 77 year old female who was 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  There was an additional medial margin that was excised which showed an isolated focus of intermediate grade DCIS.  On biopsy the size of DCIS was 4 mm.  Margins negative.  ER greater than 90% positive.  Patient underwent postlumpectomy radiation treatment.  She did start on Arimidex in January 2023, stopped it briefly and then restarted in July 2023.   Interval history-tolerating Arimidex well without any significant side effects.  She is also taking her calcium and vitamin D.  She remains active for her age.  Denies any breast concerns at this time  ECOG PS- 1 Pain scale- 0  Review of systems- Review of Systems  Constitutional:  Negative for chills, fever, malaise/fatigue and weight loss.  HENT:  Negative for congestion, ear discharge and nosebleeds.   Eyes:  Negative for blurred vision.  Respiratory:  Negative for cough, hemoptysis, sputum production, shortness of breath and wheezing.   Cardiovascular:  Negative for chest pain, palpitations, orthopnea and claudication.  Gastrointestinal:  Negative for abdominal pain, blood in stool, constipation, diarrhea, heartburn,  melena, nausea and vomiting.  Genitourinary:  Negative for dysuria, flank pain, frequency, hematuria and urgency.  Musculoskeletal:  Negative for back pain, joint pain and myalgias.  Skin:  Negative for rash.  Neurological:  Negative for dizziness, tingling, focal weakness, seizures, weakness and headaches.  Endo/Heme/Allergies:  Does not bruise/bleed easily.  Psychiatric/Behavioral:  Negative for depression and suicidal ideas. The patient does not have insomnia.       No Known Allergies   Past Medical History:  Diagnosis Date   Anxiety    Breast cancer (HCC)    DCIS (ductal carcinoma in situ) of breast    History of cataract    Personal history of radiation therapy    Pre-diabetes    last A1C in 2021 was 5.2   Sleep apnea    had uvula removed     Past Surgical History:  Procedure Laterality Date   BREAST BIOPSY Right 09/14/2020   Affirm bx-calcs, "Ribbon" marker-path pending   BREAST EXCISIONAL BIOPSY Right 1993   neg   BREAST LUMPECTOMY Right 10/12/2020   BREAST LUMPECTOMY WITH RADIOFREQUENCY TAG IDENTIFICATION Right 10/12/2020   Procedure: BREAST LUMPECTOMY WITH RADIOFREQUENCY TAG IDENTIFICATION;  Surgeon: Campbell Lerner, MD;  Location: [facility] ORS;  Service: General;  Laterality: Right;   BUNIONECTOMY Bilateral    CATARACT EXTRACTION  09/03/2020   CHOLECYSTECTOMY     COLONOSCOPY WITH PROPOFOL N/A 07/31/2020   Procedure: COLONOSCOPY WITH PROPOFOL;  Surgeon: Midge Minium, MD;  Location: [facility] ENDOSCOPY;  Service: Endoscopy;  Laterality: N/A;   IRIDOTOMY / IRIDECTOMY Right    UVULECTOMY      Social History   Socioeconomic History   Marital status: Significant Other    Spouse name:  Not on file   Number of children: 2   Years of education: Not on file   Highest education level: Bachelor's degree (e.g., BA, AB, BS)  Occupational History   Occupation: self employed    Comment: Chief Operating Officer  Tobacco Use   Smoking status: Former    Current  packs/day: 0.00    Average packs/day: 0.5 packs/day for 5.0 years (2.5 ttl pk-yrs)    Types: Cigarettes    Start date: 11/12/1982    Quit date: 11/12/1987    Years since quitting: 35.0    Passive exposure: Past   Smokeless tobacco: Never  Vaping Use   Vaping status: Never Used  Substance and Sexual Activity   Alcohol use: Yes    Comment: 2 pints on weekend beer   Drug use: Not Currently    Comment: past marijuana - very remote use   Sexual activity: Yes  Other Topics Concern   Not on file  Social History Narrative   Not on file   Social Determinants of Health   Financial Resource Strain: Low Risk  (03/28/2022)   Overall Financial Resource Strain (CARDIA)    Difficulty of Paying Living Expenses: Not hard at all  Food Insecurity: No Food Insecurity (03/28/2022)   Hunger Vital Sign    Worried About Running Out of Food in the Last Year: Never true    Ran Out of Food in the Last Year: Never true  Transportation Needs: No Transportation Needs (03/28/2022)   PRAPARE - Administrator, Civil Service (Medical): No    Lack of Transportation (Non-Medical): No  Physical Activity: Sufficiently Active (03/28/2022)   Exercise Vital Sign    Days of Exercise per Week: 3 days    Minutes of Exercise per Session: 60 min  Stress: No Stress Concern Present (03/28/2022)   [employer] of Occupational Health - Occupational Stress Questionnaire    Feeling of Stress : Not at all  Social Connections: Moderately Isolated (03/28/2022)   Social Connection and Isolation Panel [NHANES]    Frequency of Communication with Friends and Family: More than three times a week    Frequency of Social Gatherings with Friends and Family: More than three times a week    Attends Religious Services: Never    Database administrator or Organizations: No    Attends Banker Meetings: Never    Marital Status: Living with partner  Intimate Partner Violence: Not At Risk (03/28/2022)   Humiliation, Afraid,  Rape, and Kick questionnaire    Fear of Current or Ex-Partner: No    Emotionally Abused: No    Physically Abused: No    Sexually Abused: No    Family History  Problem Relation Age of Onset   Breast cancer Mother    Diabetes Mother    Depression Mother    Healthy Brother      Current Outpatient Medications:    acyclovir ointment (ZOVIRAX) 5 %, Apply 1 application topically every 3 (three) hours as needed. (Patient taking differently: Apply 1 application  topically every 3 (three) hours as needed (cold sores).), Disp: 15 g, Rfl: 1   anastrozole (ARIMIDEX) 1 MG tablet, TAKE 1 TABLET BY MOUTH EVERY DAY, Disp: 90 tablet, Rfl: 3   CALCIUM-VITAMIN D PO, Take 2 tablets by mouth daily., Disp: , Rfl:    cetirizine (ZYRTEC) 10 MG tablet, TAKE 1 TABLET(10 MG) BY MOUTH DAILY, Disp: 90 tablet, Rfl: 3   diclofenac Sodium (VOLTAREN) 1 %  GEL, Apply 1 application topically daily., Disp: , Rfl:    FLUoxetine (PROZAC) 10 MG tablet, TAKE 1 TABLET(10 MG) BY MOUTH DAILY, Disp: 90 tablet, Rfl: 0   ibuprofen (ADVIL) 800 MG tablet, Take 1 tablet (800 mg total) by mouth every 8 (eight) hours as needed., Disp: 30 tablet, Rfl: 0   MILK THISTLE PO, Take 1 capsule by mouth daily., Disp: , Rfl:    mometasone (ELOCON) 0.1 % ointment, Apply topically daily., Disp: 45 g, Rfl: 0   simvastatin (ZOCOR) 10 MG tablet, TAKE 1 TABLET(10 MG) BY MOUTH AT BEDTIME, Disp: 90 tablet, Rfl: 1   traZODone (DESYREL) 50 MG tablet, Take 2 tablets (100 mg total) by mouth at bedtime., Disp: 180 tablet, Rfl: 1   Turmeric (QC TUMERIC COMPLEX PO), Take 1 Capful by mouth daily., Disp: , Rfl:    valACYclovir (VALTREX) 1000 MG tablet, TAKE 2 TABLETS AT THE START OF YOUR COLD SORE. TAKE 1 TABLET DAILY UNTIL SORES HAVE RESOLVED., Disp: 30 tablet, Rfl: 1  Physical exam:  Vitals:   11/11/22 1305  BP: 122/84  Pulse: 66  Resp: 18  Temp: (!) 96.6 F (35.9 C)  TempSrc: Tympanic  SpO2: 99%  Weight: 134 lb 14.4 oz (61.2 kg)  Height: 5\' 7"  (1.702  m)   Physical Exam Cardiovascular:     Rate and Rhythm: Normal rate and regular rhythm.     Heart sounds: Normal heart sounds.  Pulmonary:     Effort: Pulmonary effort is normal.     Breath sounds: Normal breath sounds.  Abdominal:     General: Bowel sounds are normal.     Palpations: Abdomen is soft.  Skin:    General: Skin is warm and dry.  Neurological:     Mental Status: She is alert and oriented to person, place, and time.    Breast exam was performed in seated and lying down position. Patient is status post right lumpectomy with a well-healed surgical scar. No evidence of any palpable masses. No evidence of axillary adenopathy. No evidence of any palpable masses or lumps in the left breast. No evidence of leftt axillary adenopathy      Latest Ref Rng & Units 10/07/2022   10:58 AM  CMP  Glucose 65 - 139 mg/dL 89   BUN 7 - 25 mg/dL 7   Creatinine 4.40 - 1.02 mg/dL 7.25   Sodium 366 - 440 mmol/L 139   Potassium 3.5 - 5.3 mmol/L 4.5   Chloride 98 - 110 mmol/L 104   CO2 20 - 32 mmol/L 28   Calcium 8.6 - 10.4 mg/dL 9.6   Total Protein 6.1 - 8.1 g/dL 6.7   Total Bilirubin 0.2 - 1.2 mg/dL 0.5   AST 10 - 35 U/L 20   ALT 6 - 29 U/L 18       Latest Ref Rng & Units 10/07/2022   10:58 AM  CBC  WBC 3.8 - 10.8 Thousand/uL 7.2   Hemoglobin 11.7 - 15.5 g/dL 34.7   Hematocrit 42.5 - 45.0 % 41.9   Platelets 140 - 400 Thousand/uL 250      Assessment and plan- Patient is a 77 y.o. female with history of right breast DCIS ER positive currently on Arimidex here for routine follow-up  Patient is tolerating Arimidex along with calcium and vitamin DWell without any significant side effects.  Her mammogram from August 2024 was unremarkable.  Clinically she is doing well with no concerning signs and symptoms of recurrence based on today's  exam.  I will see her back in 6 months no labs   Visit Diagnosis 1. Visit for monitoring Arimidex therapy   2. Ductal carcinoma in situ (DCIS) of  right breast      Dr. Owens Shark, MD, MPH [facility] [phone] 11/11/2022 2:01 PM

## 2022-12-05 ENCOUNTER — Other Ambulatory Visit: Payer: Self-pay | Admitting: Internal Medicine

## 2022-12-05 DIAGNOSIS — F419 Anxiety disorder, unspecified: Secondary | ICD-10-CM

## 2022-12-07 ENCOUNTER — Other Ambulatory Visit: Payer: Self-pay | Admitting: Internal Medicine

## 2022-12-07 DIAGNOSIS — F5104 Psychophysiologic insomnia: Secondary | ICD-10-CM

## 2022-12-08 ENCOUNTER — Other Ambulatory Visit: Payer: Self-pay | Admitting: Internal Medicine

## 2022-12-08 DIAGNOSIS — F419 Anxiety disorder, unspecified: Secondary | ICD-10-CM

## 2022-12-08 MED ORDER — FLUOXETINE HCL 10 MG PO TABS: ORAL_TABLET | ORAL | 0 refills | Status: DC

## 2022-12-08 NOTE — Telephone Encounter (Signed)
Requested Prescriptions  Refused Prescriptions Disp Refills   FLUoxetine (PROZAC) 10 MG tablet [Pharmacy Med Name: FLUOXETINE 10MG  TABLETS] 90 tablet 0    Sig: TAKE 1 TABLET(10 MG) BY MOUTH DAILY     Psychiatry:  Antidepressants - SSRI Passed - 12/05/2022 11:18 AM      Passed - Valid encounter within last 6 months    Recent Outpatient Visits           2 months ago Encounter for general adult medical examination with abnormal findings   [place] [facility] [place], Salvadore Oxford, NP   8 months ago Pure hypercholesterolemia   [place] [facility] [place], Salvadore Oxford, NP   1 year ago Encounter for general adult medical examination with abnormal findings   [place] [facility] [place], Salvadore Oxford, NP   2 years ago Encounter for completion of form with patient   [place] [facility] [place], Salvadore Oxford, NP   2 years ago Encounter for general adult medical examination with abnormal findings   [place] [facility] [place], Salvadore Oxford, NP       Future Appointments             In 4 months Baity, Salvadore Oxford, NP  [facility], [facility]

## 2022-12-09 NOTE — Telephone Encounter (Signed)
Refused Trazodone 50 mg because it's being requested too soon.

## 2022-12-10 ENCOUNTER — Other Ambulatory Visit: Payer: Self-pay | Admitting: Internal Medicine

## 2022-12-10 DIAGNOSIS — F419 Anxiety disorder, unspecified: Secondary | ICD-10-CM

## 2022-12-11 NOTE — Telephone Encounter (Signed)
Requested Prescriptions  Pending Prescriptions Disp Refills   FLUoxetine (PROZAC) 10 MG tablet [Pharmacy Med Name: FLUOXETINE 10MG  TABLETS] 90 tablet 0    Sig: TAKE 1 TABLET(10 MG) BY MOUTH DAILY     Psychiatry:  Antidepressants - SSRI Passed - 12/10/2022  8:04 AM      Passed - Valid encounter within last 6 months    Recent Outpatient Visits           2 months ago Encounter for general adult medical examination with abnormal findings   [place] [facility] [place], Salvadore Oxford, NP   8 months ago Pure hypercholesterolemia   [place] [facility] [place], Salvadore Oxford, NP   1 year ago Encounter for general adult medical examination with abnormal findings   [place] [facility] [place], Salvadore Oxford, NP   2 years ago Encounter for completion of form with patient   [place] [facility] [place], Salvadore Oxford, NP   2 years ago Encounter for general adult medical examination with abnormal findings   [place] [facility] [place], Salvadore Oxford, NP       Future Appointments             In 3 months Baity, Salvadore Oxford, NP [facility], [facility]

## 2023-02-26 DIAGNOSIS — K080 Exfoliation of teeth due to systemic causes: Secondary | ICD-10-CM | POA: Diagnosis not present

## 2023-03-06 ENCOUNTER — Other Ambulatory Visit: Payer: Self-pay | Admitting: Internal Medicine

## 2023-03-06 DIAGNOSIS — F5104 Psychophysiologic insomnia: Secondary | ICD-10-CM

## 2023-03-06 NOTE — Telephone Encounter (Signed)
 Rx 10/07/22 #180 1RF- too soon Requested Prescriptions  Pending Prescriptions Disp Refills   traZODone  (DESYREL ) 50 MG tablet [Pharmacy Med Name: TRAZODONE  50MG  TABLETS] 180 tablet 1    Sig: TAKE 2 TABLETS(100 MG) BY MOUTH AT BEDTIME     Psychiatry: Antidepressants - Serotonin Modulator Passed - 03/06/2023  3:45 PM      Passed - Valid encounter within last 6 months    Recent Outpatient Visits           5 months ago Encounter for general adult medical examination with abnormal findings   [place] [facility] [place], Angeline ORN, NP   11 months ago Pure hypercholesterolemia   [place] [facility] [place], Angeline ORN, NP   1 year ago Encounter for general adult medical examination with abnormal findings   [place] [facility] [place], Angeline ORN, NP   2 years ago Encounter for completion of form with patient   [place] [facility] [place], Angeline ORN, NP   2 years ago Encounter for general adult medical examination with abnormal findings   [facility] [place], Angeline ORN, NP       Future Appointments             In 1 month Baity, Angeline ORN, NP [place] [facility], [facility]

## 2023-03-08 ENCOUNTER — Other Ambulatory Visit: Payer: Self-pay | Admitting: Internal Medicine

## 2023-03-08 DIAGNOSIS — F419 Anxiety disorder, unspecified: Secondary | ICD-10-CM

## 2023-03-09 MED ORDER — FLUOXETINE HCL 10 MG PO TABS: ORAL_TABLET | ORAL | 0 refills | Status: DC

## 2023-03-19 ENCOUNTER — Ambulatory Visit: Payer: Medicare Other | Admitting: Radiation Oncology

## 2023-03-24 ENCOUNTER — Other Ambulatory Visit: Payer: Self-pay | Admitting: Internal Medicine

## 2023-03-25 NOTE — Telephone Encounter (Signed)
 Requested Prescriptions  Pending Prescriptions Disp Refills   simvastatin (ZOCOR) 10 MG tablet [Pharmacy Med Name: SIMVASTATIN 10MG  TABLETS] 90 tablet 1    Sig: TAKE 1 TABLET(10 MG) BY MOUTH AT BEDTIME     Cardiovascular:  Antilipid - Statins Failed - 03/25/2023 12:10 PM      Failed - Lipid Panel in normal range within the last 12 months    Cholesterol  Date Value Ref Range Status  10/07/2022 152 <200 mg/dL Final   LDL Cholesterol (Calc)  Date Value Ref Range Status  10/07/2022 75 mg/dL (calc) Final    Comment:    Reference range: <100 . Desirable range <100 mg/dL for primary prevention;   <70 mg/dL for patients with CHD or diabetic patients  with > or = 2 CHD risk factors. Marland Kitchen LDL-C is now calculated using the Martin-Hopkins  calculation, which is a validated novel method providing  better accuracy than the Friedewald equation in the  estimation of LDL-C.  Horald Pollen et al. Lenox Ahr. 0981;191(47): 2061-2068  (http://education.[employer].com/faq/FAQ164)    HDL  Date Value Ref Range Status  10/07/2022 51 > OR = 50 mg/dL Final   Triglycerides  Date Value Ref Range Status  10/07/2022 166 (H) <150 mg/dL Final         Passed - Patient is not pregnant      Passed - Valid encounter within last 12 months    Recent Outpatient Visits           5 months ago Encounter for general adult medical examination with abnormal findings   [place] [facility] [place], Salvadore Oxford, NP   11 months ago Pure hypercholesterolemia   [place] [facility] [place], Salvadore Oxford, NP   1 year ago Encounter for general adult medical examination with abnormal findings   [place] [place], Salvadore Oxford, NP   2 years ago Encounter for completion of form with patient   [place] [facility] [place], Salvadore Oxford, NP   2 years ago Encounter for general adult medical examination with abnormal findings   [place] [facility] [place], Salvadore Oxford, NP       Future Appointments             In 1 week Sam[place] Si, Salvadore Oxford, NP [facility], [facility]

## 2023-04-03 ENCOUNTER — Ambulatory Visit: Payer: Self-pay

## 2023-04-03 DIAGNOSIS — F419 Anxiety disorder, unspecified: Secondary | ICD-10-CM | POA: Diagnosis not present

## 2023-04-03 DIAGNOSIS — Z0000 Encounter for general adult medical examination without abnormal findings: Secondary | ICD-10-CM

## 2023-04-03 DIAGNOSIS — Z1231 Encounter for screening mammogram for malignant neoplasm of breast: Secondary | ICD-10-CM

## 2023-04-03 MED ORDER — FLUOXETINE HCL 10 MG PO CAPS: 10.0000 mg | ORAL_CAPSULE | Freq: Every day | ORAL | Status: DC

## 2023-04-03 NOTE — Addendum Note (Signed)
 Addended by: Lorre Munroe on: 04/03/2023 10:43 AM   Modules accepted: Orders

## 2023-04-03 NOTE — Patient Instructions (Addendum)
 Tracie Reed , Thank you for taking time to come for your Medicare Wellness Visit. I appreciate your ongoing commitment to your health goals. Please review the following plan we discussed and let me know if I can assist you in the future.   Referrals/Orders/Follow-Ups/Clinician Recommendations: REFERRAL FOR MAMMOGRAM SENT  You have an order for:  []   2D Mammogram  [x]   3D Mammogram  []   Bone Density     Please call for appointment:  [facility]  [address] [place] [address] [phone] [facility] [address] [place], [place] [address] [phone] [employer] [facility] [address] Geanie [place], [place] [address] [phone]   Make sure to wear two-piece clothing.  No lotions, powders, or deodorants the day of the appointment. Make sure to bring picture ID and insurance card.  Bring list of medications you are currently taking including any supplements.   Schedule your [place] screening mammogram through MyChart!   Log into your MyChart account.  Go to 'Visit' (or 'Appointments' if on mobile App) --> Schedule an Appointment  Under 'Select a Reason for Visit' choose the Mammogram Screening option.  Complete the pre-visit questions and select the time and place that best fits your schedule.   This is a list of the screening recommended for you and due dates:  Health Maintenance  Topic Date Due   DTaP/Tdap/Td vaccine (1 - Tdap) Never done   COVID-19 Vaccine (7 - 2024-25 season) 09/28/2022   Medicare Annual Wellness Visit  04/02/2024   Pneumonia Vaccine  Completed   Flu Shot  Completed   DEXA scan (bone density measurement)  Completed   Hepatitis C Screening  Completed   Zoster (Shingles) Vaccine  Completed   HPV Vaccine  Aged Out   Colon Cancer Screening  Discontinued    Advanced directives: (ACP Link)Information on Advanced Care  Planning can be found at [facility] Advance Health Care Directives Advance Health Care Directives. [email or web address]   Next Medicare Annual Wellness Visit scheduled for next year: Yes   04/08/24 @ 9:30 AM BY PHONE

## 2023-04-03 NOTE — Progress Notes (Signed)
 Subjective:   Tracie Reed is a 78 y.o. who presents for a Medicare Wellness preventive visit.  Visit Complete: Virtual I connected with  Tracie Reed on 04/03/23 by a audio enabled telemedicine application and verified that I am speaking with the correct person using two identifiers.  Patient Location: Home  Provider Location: Office/Clinic  I discussed the limitations of evaluation and management by telemedicine. The patient expressed understanding and agreed to proceed.  Vital Signs: Because this visit was a virtual/telehealth visit, some criteria may be missing or patient reported. Any vitals not documented were not able to be obtained and vitals that have been documented are patient reported.  VideoDeclined- This patient declined Librarian, academic. Therefore the visit was completed with audio only.  AWV Questionnaire: No: Patient Medicare AWV questionnaire was not completed prior to this visit.  Cardiac Risk Factors include: advanced age (>13men, >66 women);dyslipidemia     Objective:    There were no vitals filed for this visit. There is no height or weight on file to calculate BMI.     04/03/2023   10:15 AM 11/11/2022   12:57 PM 05/12/2022   12:59 PM 03/28/2022   11:10 AM 03/20/2022   10:45 AM 11/08/2021    1:13 PM 11/08/2021    1:11 PM  Advanced Directives  Does Patient Have a Medical Advance Directive? No Yes Yes No Yes Yes No  Type of Best boy of [place];Living will  Healthcare Power of [place];Living will Healthcare Power of [place];Living will   Does patient want to make changes to medical advance directive?  Yes (ED - Information included in AVS)   No - Patient declined No - Patient declined   Copy of Healthcare Power of Attorney in Chart?     No - copy requested No - copy requested   Would patient like information on creating a medical advance directive? No - Patient declined   No - Patient declined   No - Patient declined No - Patient declined    Current Medications (verified) Outpatient Encounter Medications as of 04/03/2023  Medication Sig   acyclovir ointment (ZOVIRAX) 5 % Apply 1 application topically every 3 (three) hours as needed. (Patient taking differently: Apply 1 application  topically every 3 (three) hours as needed (cold sores).)   anastrozole (ARIMIDEX) 1 MG tablet TAKE 1 TABLET BY MOUTH EVERY DAY   CALCIUM-VITAMIN D PO Take 2 tablets by mouth daily.   cetirizine (ZYRTEC) 10 MG tablet TAKE 1 TABLET(10 MG) BY MOUTH DAILY   diclofenac Sodium (VOLTAREN) 1 % GEL Apply 1 application topically daily.   FLUoxetine (PROZAC) 10 MG tablet TAKE 1 TABLET(10 MG) BY MOUTH DAILY   ibuprofen (ADVIL) 800 MG tablet Take 1 tablet (800 mg total) by mouth every 8 (eight) hours as needed.   MILK THISTLE PO Take 1 capsule by mouth daily.   mometasone (ELOCON) 0.1 % ointment Apply topically daily.   simvastatin (ZOCOR) 10 MG tablet TAKE 1 TABLET(10 MG) BY MOUTH AT BEDTIME   traZODone (DESYREL) 50 MG tablet Take 2 tablets (100 mg total) by mouth at bedtime.   Turmeric (QC TUMERIC COMPLEX PO) Take 1 Capful by mouth daily.   valACYclovir (VALTREX) 1000 MG tablet TAKE 2 TABLETS AT THE START OF YOUR COLD SORE. TAKE 1 TABLET DAILY UNTIL SORES HAVE RESOLVED.   [DISCONTINUED] simvastatin (ZOCOR) 10 MG tablet TAKE 1 TABLET(10 MG) BY MOUTH AT BEDTIME   No facility-administered encounter medications on file as  of 04/03/2023.    Allergies (verified) Patient has no known allergies.   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    History of cataract    Personal history of radiation therapy    Pre-diabetes    last A1C in 2021 was 5.2   Sleep apnea    had uvula removed   Past Surgical History:  Procedure Laterality Date   BREAST BIOPSY Right 09/14/2020   Affirm bx-calcs, "Ribbon" marker-path pending   BREAST  EXCISIONAL BIOPSY Right 1993   neg   BREAST LUMPECTOMY Right 10/12/2020   BREAST LUMPECTOMY WITH RADIOFREQUENCY TAG IDENTIFICATION Right 10/12/2020   Procedure: BREAST LUMPECTOMY WITH RADIOFREQUENCY TAG IDENTIFICATION;  Surgeon: Campbell Lerner, MD;  Location: [facility] ORS;  Service: General;  Laterality: Right;   BUNIONECTOMY Bilateral    CATARACT EXTRACTION  09/03/2020   CHOLECYSTECTOMY     COLONOSCOPY WITH PROPOFOL N/A 07/31/2020   Procedure: COLONOSCOPY WITH PROPOFOL;  Surgeon: Midge Minium, MD;  Location: [facility] ENDOSCOPY;  Service: Endoscopy;  Laterality: N/A;   IRIDOTOMY / IRIDECTOMY Right    UVULECTOMY     Family History  Problem Relation Age of Onset   Breast cancer Mother    Diabetes Mother    Depression Mother    Healthy Brother    Social History   Socioeconomic History   Marital status: Significant Other    Spouse name: Not on file   Number of children: 2   Years of education: Not on file   Highest education level: Bachelor's degree (e.g., BA, AB, BS)  Occupational History   Occupation: self employed    Comment: Chief Operating Officer  Tobacco Use   Smoking status: Former    Current packs/day: 0.00    Average packs/day: 0.5 packs/day for 5.0 years (2.5 ttl pk-yrs)    Types: Cigarettes    Start date: 11/12/1982    Quit date: 11/12/1987    Years since quitting: 35.4    Passive exposure: Past   Smokeless tobacco: Never  Vaping Use   Vaping status: Never Used  Substance and Sexual Activity   Alcohol use: Yes    Comment: 2 pints on weekend beer   Drug use: Not Currently    Comment: past marijuana - very remote use   Sexual activity: Yes  Other Topics Concern   Not on file  Social History Narrative   Not on file   Social Drivers of Health   Financial Resource Strain: Low Risk  (04/03/2023)   Overall Financial Resource Strain (CARDIA)    Difficulty of Paying Living Expenses: Not hard at all  Food Insecurity: No Food Insecurity (04/03/2023)    Hunger Vital Sign    Worried About Running Out of Food in the Last Year: Never true    Ran Out of Food in the Last Year: Never true  Transportation Needs: No Transportation Needs (04/03/2023)   PRAPARE - Administrator, Civil Service (Medical): No    Lack of Transportation (Non-Medical): No  Physical Activity: Insufficiently Active (04/03/2023)   Exercise Vital Sign    Days of Exercise per Week: 7 days    Minutes of Exercise per Session: 20 min  Stress: No Stress Concern Present (04/03/2023)   [employer] of Occupational Health - Occupational Stress Questionnaire    Feeling of Stress : Only a little  Social Connections: Moderately Isolated (04/03/2023)  Social Advertising account executive [NHANES]    Frequency of Communication with Friends and Family: Twice a week    Frequency of Social Gatherings with Friends and Family: Once a week    Attends Religious Services: Never    Database administrator or Organizations: No    Attends Engineer, structural: Never    Marital Status: Living with partner    Tobacco Counseling Counseling given: Not Answered    Clinical Intake:  Pre-visit preparation completed: Yes  Pain : No/denies pain     Nutritional Status: BMI of 19-24  Normal Nutritional Risks: None Diabetes: No  How often do you need to have someone help you when you read instructions, pamphlets, or other written materials from your doctor or pharmacy?: 1 - Never  Interpreter Needed?: No  Information entered by :: Kennedy Bucker, LPN   Activities of Daily Living     04/03/2023   10:16 AM 03/30/2023    8:40 AM  In your present state of health, do you have any difficulty performing the following activities:  Hearing? 0 0  Vision? 0 0  Difficulty concentrating or making decisions? 0 0  Walking or climbing stairs? 0 0  Dressing or bathing? 0 0  Doing errands, shopping? 0 0  Preparing Food and eating ? N N  Using the Toilet? N N  In the past six  months, have you accidently leaked urine? N N  Do you have problems with loss of bowel control? N N  Managing your Medications? N N  Managing your Finances? N N  Housekeeping or managing your Housekeeping? N N    Patient Care Team: Lorre Munroe, NP as PCP - General (Internal Medicine) Jim Like, RN as Oncology Nurse Navigator Creig Hines, MD as Consulting Physician (Oncology) Carmina Miller, MD as Consulting Physician (Radiation Oncology) Campbell Lerner, MD as Consulting Physician (General Surgery)  Indicate any recent Medical Services you may have received from other than [facility] providers in the past year (date may be approximate).     Assessment:   This is a routine wellness examination for [facility].  Hearing/Vision screen Hearing Screening - Comments:: NO AIDS Vision Screening - Comments:: READ[facility]- [facility]HAGAR [facility] [place]   Goals Addressed             This Visit's Progress    Cut out extra servings         Depression Screen     04/03/2023   10:13 AM 10/07/2022   10:58 AM 04/03/2022    1:57 PM 03/28/2022   11:08 AM 08/23/2021    1:32 PM 03/14/2021   11:09 AM 02/07/2020    8:28 AM  PHQ 2/9 Scores  PHQ - 2 Score 0 0 0 0 0 0 0  PHQ- 9 Score 1  1 0 0 1     Fall Risk     04/03/2023   10:16 AM 03/30/2023    8:40 AM 10/07/2022   10:58 AM 09/09/2022    9:43 AM 04/03/2022    1:58 PM  Fall Risk   Falls in the past year? 0  0 0 0  Number falls in past yr: 0 0  0   Injury with Fall? 0 0 0 0 0  Risk for fall due to : No Fall Risks  No Fall Risks  No Fall Risks  Follow up Falls prevention discussed;Falls evaluation completed        MEDICARE RISK AT HOME:  Medicare Risk  at Home Any stairs in or around the home?: Yes If so, are there any without handrails?: No Home free of loose throw rugs in walkways, pet beds, electrical cords, etc?: Yes Adequate lighting in your home to reduce risk of falls?: Yes Life alert?: No Use of a cane, walker or w/c?: No Grab bars in the  bathroom?: Yes Shower chair or bench in shower?: Yes Elevated toilet seat or a handicapped toilet?: No  TIMED UP AND GO:  Was the test performed?  No  Cognitive Function: 6CIT completed        04/03/2023   10:21 AM 03/28/2022   11:18 AM 03/14/2021   11:14 AM 02/07/2020    8:32 AM  6CIT Screen  What Year? 0 points 0 points 0 points 0 points  What month? 0 points 0 points 0 points 0 points  What time? 0 points 0 points 0 points 0 points  Count back from 20 0 points 0 points 0 points 0 points  Months in reverse 0 points 0 points 0 points 0 points  Repeat phrase 0 points 0 points 0 points 0 points  Total Score 0 points 0 points 0 points 0 points    Immunizations Immunization History  Administered Date(s) Administered   Fluad Quad(high Dose 65+) 01/11/2019, 12/14/2019   Influenza, High Dose Seasonal PF 11/11/2017, 10/23/2020   Influenza, Quadrivalent, Recombinant, Inj, Pf 11/20/2016   Influenza-Unspecified 10/12/2022   Moderna Covid-19 Vaccine Bivalent Booster 16yrs & up 10/23/2020   Moderna Sars-Covid-2 Vaccination 04/28/2019, 05/26/2019, 12/19/2019, 05/09/2020   Pneumococcal Conjugate-13 01/11/2019   Pneumococcal Polysaccharide-23 02/21/2020, 11/02/2020   Unspecified SARS-COV-2 Vaccination 11/08/2021   Zoster Recombinant(Shingrix) 12/15/2019, 11/02/2020    Screening Tests Health Maintenance  Topic Date Due   DTaP/Tdap/Td (1 - Tdap) Never done   COVID-19 Vaccine (7 - 2024-25 season) 09/28/2022   Medicare Annual Wellness (AWV)  04/02/2024   Pneumonia Vaccine 43+ Years old  Completed   INFLUENZA VACCINE  Completed   DEXA SCAN  Completed   Hepatitis C Screening  Completed   Zoster Vaccines- Shingrix  Completed   HPV VACCINES  Aged Out   Colonoscopy  Discontinued    Health Maintenance  Health Maintenance Due  Topic Date Due   DTaP/Tdap/Td (1 - Tdap) Never done   COVID-19 Vaccine (7 - 2024-25 season) 09/28/2022   Health Maintenance Items Addressed: REFERRAL FOR  MAMMOGRAM SENT  Additional Screening:  Vision Screening: Recommended annual ophthalmology exams for early detection of glaucoma and other disorders of the eye.  Dental Screening: Recommended annual dental exams for proper oral hygiene  Community Resource Referral / Chronic Care Management: CRR required this visit?  No   CCM required this visit?  No     Plan:     I have personally reviewed and noted the following in the patient's chart:   Medical and social history Use of alcohol, tobacco or illicit drugs  Current medications and supplements including opioid prescriptions. Patient is not currently taking opioid prescriptions. Functional ability and status Nutritional status Physical activity Advanced directives List of other physicians Hospitalizations, surgeries, and ER visits in previous 12 months Vitals Screenings to include cognitive, depression, and falls Referrals and appointments  In addition, I have reviewed and discussed with patient certain preventive protocols, quality metrics, and best practice recommendations. A written personalized care plan for preventive services as well as general preventive health recommendations were provided to patient.     Hal Hope, LPN   08/04/2954   After Visit  Summary: (MyChart) Due to this being a telephonic visit, the after visit summary with patients personalized plan was offered to patient via MyChart   Notes:  REFERRAL FOR MAMMOGRAM SENT

## 2023-04-06 ENCOUNTER — Ambulatory Visit: Payer: Medicare Other | Admitting: Radiation Oncology

## 2023-04-07 ENCOUNTER — Encounter: Payer: Self-pay | Admitting: Internal Medicine

## 2023-04-07 ENCOUNTER — Ambulatory Visit (INDEPENDENT_AMBULATORY_CARE_PROVIDER_SITE_OTHER): Payer: Medicare Other | Admitting: Internal Medicine

## 2023-04-07 VITALS — BP 138/76 | HR 110 | Ht 67.0 in | Wt 132.8 lb

## 2023-04-07 DIAGNOSIS — R002 Palpitations: Secondary | ICD-10-CM

## 2023-04-07 DIAGNOSIS — E7800 Pure hypercholesterolemia, unspecified: Secondary | ICD-10-CM | POA: Diagnosis not present

## 2023-04-07 DIAGNOSIS — F419 Anxiety disorder, unspecified: Secondary | ICD-10-CM

## 2023-04-07 DIAGNOSIS — R7303 Prediabetes: Secondary | ICD-10-CM | POA: Insufficient documentation

## 2023-04-07 DIAGNOSIS — M8589 Other specified disorders of bone density and structure, multiple sites: Secondary | ICD-10-CM

## 2023-04-07 DIAGNOSIS — B001 Herpesviral vesicular dermatitis: Secondary | ICD-10-CM

## 2023-04-07 DIAGNOSIS — F5104 Psychophysiologic insomnia: Secondary | ICD-10-CM

## 2023-04-07 DIAGNOSIS — Z853 Personal history of malignant neoplasm of breast: Secondary | ICD-10-CM

## 2023-04-07 DIAGNOSIS — I10 Essential (primary) hypertension: Secondary | ICD-10-CM

## 2023-04-07 DIAGNOSIS — L308 Other specified dermatitis: Secondary | ICD-10-CM

## 2023-04-07 MED ORDER — FLUOXETINE HCL 20 MG PO CAPS: 20.0000 mg | ORAL_CAPSULE | Freq: Every day | ORAL | 1 refills | Status: DC

## 2023-04-07 MED ORDER — TRAZODONE HCL 50 MG PO TABS: 150.0000 mg | ORAL_TABLET | Freq: Every day | ORAL | Status: DC

## 2023-04-07 NOTE — Assessment & Plan Note (Signed)
Continue valacyclovir and acyclovir ointment as needed

## 2023-04-07 NOTE — Patient Instructions (Signed)
 Managing Stress, Adult Feeling a certain amount of stress is normal. Stress helps our body and mind get ready to deal with the demands of life. Stress hormones can motivate you to do well at work and meet your responsibilities. But severe or long-term (chronic) stress can affect your mental and physical health. Chronic stress puts you at higher risk for: Anxiety and depression. Other health problems such as digestive problems, muscle aches, heart disease, high blood pressure, and stroke. What are the causes? Common causes of stress include: Demands from work, such as deadlines, feeling overworked, or having long hours. Pressures at home, such as money issues, disagreements with a spouse, or parenting issues. Pressures from major life changes, such as divorce, moving, loss of a loved one, or chronic illness. You may be at higher risk for stress-related problems if you: Do not get enough sleep. Are in poor health. Do not have emotional support. Have a mental health disorder such as anxiety or depression. How to recognize stress Stress can make you: Have trouble sleeping. Feel sad, anxious, irritable, or overwhelmed. Lose your appetite. Overeat or want to eat unhealthy foods. Want to use drugs or alcohol. Stress can also cause physical symptoms, such as: Sore, tense muscles, especially in the shoulders and neck. Headaches. Trouble breathing. A faster heart rate. Stomach pain, nausea, or vomiting. Diarrhea or constipation. Trouble concentrating. Follow these instructions at home: Eating and drinking Eat a healthy diet. This includes: Eating foods that are high in fiber, such as beans, whole grains, and fresh fruits and vegetables. Limiting foods that are high in fat and processed sugars, such as fried or sweet foods. Do not skip meals or overeat. Drink enough fluid to keep your urine pale yellow. Alcohol use Do not drink alcohol if: Your health care provider tells you not to  drink. You are pregnant, may be pregnant, or are planning to become pregnant. Drinking alcohol is a way some people try to ease their stress. This can be dangerous, so if you drink alcohol: Limit how much you have to: 0-1 drink a day for women. 0-2 drinks a day for men. Know how much alcohol is in your drink. In the [place] one drink equals one 12 oz bottle of beer (355 mL), one 5 oz glass of wine (148 mL), or one 1 oz glass of hard liquor (44 mL). Activity  Include 30 minutes of exercise in your daily schedule. Exercise is a good stress reducer. Include time in your day for an activity that you find relaxing. Try taking a walk, going on a bike ride, reading a book, or listening to music. Schedule your time in a way that lowers stress, and keep a regular schedule. Focus on doing what is most important to get done. Lifestyle Identify the source of your stress and your reaction to it. See a therapist who can help you change unhelpful reactions. When there are stressful events: Talk about them with family, friends, or coworkers. Try to think realistically about stressful events and not ignore them or overreact. Try to find the positives in a stressful situation and not focus on the negatives. Cut back on responsibilities at work and home, if possible. Ask for help from friends or family members if you need it. Find ways to manage stress, such as: Mindfulness, meditation, or deep breathing. Yoga or tai chi. Progressive muscle relaxation. Spending time in nature. Doing art, playing music, or reading. Making time for fun activities. Spending time with family and friends. Get support  from family, friends, or spiritual resources. General instructions Get enough sleep. Try to go to sleep and get up at about the same time every day. Take over-the-counter and prescription medicines only as told by your health care provider. Do not use any products that contain nicotine or tobacco. These products  include cigarettes, chewing tobacco, and vaping devices, such as e-cigarettes. If you need help quitting, ask your health care provider. Do not use drugs or smoke to deal with stress. Keep all follow-up visits. This is important. Where to find support Talk with your health care provider about stress management or finding a support group. Find a therapist to work with you on your stress management techniques. Where to find more information [employer] on Mental Illness: www.nami.org American Psychological Association: [employer] Contact a health care provider if: Your stress symptoms get worse. You are unable to manage your stress at home. You are struggling to stop using drugs or alcohol. Get help right away if: You may be a danger to yourself or others. You have any thoughts of death or suicide. Get help right awayif you feel like you may hurt yourself or others, or have thoughts about taking your own life. Go to your nearest emergency room or: Call 911. Call the National Suicide Prevention Lifeline at (304)678-3299 or 988 in the [place] This is open 24 hours a day. If you're a Veteran: Call 988 and press 1. This is open 24 hours a day. Text the [employer] at 226-132-3352. Summary Feeling a certain amount of stress is normal, but severe or long-term (chronic) stress can affect your mental and physical health. Chronic stress can put you at higher risk for anxiety, depression, and other health problems such as digestive problems, muscle aches, heart disease, high blood pressure, and stroke. You may be at higher risk for stress-related problems if you do not get enough sleep, are in poor health, lack emotional support, or have a mental health disorder such as anxiety or depression. Identify the source of your stress and your reaction to it. Try talking about stressful events with family, friends, or coworkers, finding a coping method, or getting support from spiritual resources. If  you need more help, talk with your health care provider about finding a support group or a mental health therapist. This information is not intended to replace advice given to you by your health care provider. Make sure you discuss any questions you have with your health care provider. Document Revised: 08/28/2022 Document Reviewed: 08/07/2020 Elsevier Patient Education  2024 [employer].

## 2023-04-07 NOTE — Assessment & Plan Note (Signed)
A1c today Encourage low-carb diet 

## 2023-04-07 NOTE — Assessment & Plan Note (Signed)
 She feels like this is anxiety related We will increase her dose of fluoxetine and trazodone and will have her return in 2 weeks for BP check If BP remains elevated at that time, will start antihypertensive therapy

## 2023-04-07 NOTE — Assessment & Plan Note (Signed)
Continue calcium and vitamin D OTC Continue daily weightbearing exercise

## 2023-04-07 NOTE — Progress Notes (Signed)
 Subjective:    Patient ID: Tracie Reed, female    DOB: [birth year], 78 y.o.   MRN: [id number]  HPI  Patient presents to clinic today for follow-up of chronic conditions.  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  She takes valacyclovir orally and acyclovir ointment as needed with good relief of symptoms.  Osteopenia: She is taking calcium and vitamin d OTC.  She tries to get weightbearing exercise daily.  Bone density from 08/2021 reviewed.  Eczema: Managed with mometasone ointment.  She does not follow with dermatology.  Anxiety: Chronic, managed on fluoxetine. She does feel like her anxiety has been worse lately due to a poor financial decision. She is not currently seeing a therapist.  She denies depression, SI/HI.  Insomnia: She has difficulty staying asleep.  She takes trazodone as prescribed good results.  There is no sleep study on file.  HLD: Her last LDL was 75, triglycerides 782, 09/2022.  She denies myalgias on simvastatin.  She has been trying to consume low-fat diet.  Hx of breast cancer: In remission.  She is taking arimidex as prescribed.  She gets yearly mammograms.  She follows with oncology.  Prediabetes: Her last A1c was 5.8%, 09/2022.  She is not taking any oral diabetic medications time.  She does not check her sugars.  HTN: 142/78. She is not taking any antihypertensive medication at this time. ECG from 09/2020 reviewed.  Review of Systems     Past Medical History:  Diagnosis Date   Anxiety    Breast cancer (HCC)    DCIS (ductal carcinoma in situ) of breast    History of cataract    Personal history of radiation therapy    Pre-diabetes    last A1C in 2021 was 5.2   Sleep apnea    had uvula removed    Current Outpatient Medications  Medication Sig Dispense Refill   acyclovir ointment (ZOVIRAX) 5 % Apply 1 application topically every 3 (three) hours as needed. (Patient taking differently: Apply 1 application  topically every 3 (three) hours as  needed (cold sores).) 15 g 1   anastrozole (ARIMIDEX) 1 MG tablet TAKE 1 TABLET BY MOUTH EVERY DAY 90 tablet 3   CALCIUM-VITAMIN D PO Take 2 tablets by mouth daily.     cetirizine (ZYRTEC) 10 MG tablet TAKE 1 TABLET(10 MG) BY MOUTH DAILY 90 tablet 3   diclofenac Sodium (VOLTAREN) 1 % GEL Apply 1 application topically daily.     FLUoxetine (PROZAC) 10 MG capsule Take 1 capsule (10 mg total) by mouth daily.     ibuprofen (ADVIL) 800 MG tablet Take 1 tablet (800 mg total) by mouth every 8 (eight) hours as needed. 30 tablet 0   MILK THISTLE PO Take 1 capsule by mouth daily.     mometasone (ELOCON) 0.1 % ointment Apply topically daily. 45 g 0   simvastatin (ZOCOR) 10 MG tablet TAKE 1 TABLET(10 MG) BY MOUTH AT BEDTIME 90 tablet 1   traZODone (DESYREL) 50 MG tablet Take 2 tablets (100 mg total) by mouth at bedtime. 180 tablet 1   Turmeric (QC TUMERIC COMPLEX PO) Take 1 Capful by mouth daily.     valACYclovir (VALTREX) 1000 MG tablet TAKE 2 TABLETS AT THE START OF YOUR COLD SORE. TAKE 1 TABLET DAILY UNTIL SORES HAVE RESOLVED. 30 tablet 1   No current facility-administered medications for this visit.    No Known Allergies  Family History  Problem Relation Age of Onset   Breast cancer Mother  Diabetes Mother    Depression Mother    Healthy Brother     Social History   Socioeconomic History   Marital status: Significant Other    Spouse name: Not on file   Number of children: 2   Years of education: Not on file   Highest education level: Bachelor's degree (e.g., BA, AB, BS)  Occupational History   Occupation: self employed    Comment: Chief Operating Officer  Tobacco Use   Smoking status: Former    Current packs/day: 0.00    Average packs/day: 0.5 packs/day for 5.0 years (2.5 ttl pk-yrs)    Types: Cigarettes    Start date: 11/12/1982    Quit date: 11/12/1987    Years since quitting: 35.4    Passive exposure: Past   Smokeless tobacco: Never  Vaping Use   Vaping  status: Never Used  Substance and Sexual Activity   Alcohol use: Yes    Comment: 2 pints on weekend beer   Drug use: Not Currently    Comment: past marijuana - very remote use   Sexual activity: Yes  Other Topics Concern   Not on file  Social History Narrative   Not on file   Social Drivers of Health   Financial Resource Strain: Low Risk  (04/03/2023)   Overall Financial Resource Strain (CARDIA)    Difficulty of Paying Living Expenses: Not hard at all  Food Insecurity: No Food Insecurity (04/03/2023)   Hunger Vital Sign    Worried About Running Out of Food in the Last Year: Never true    Ran Out of Food in the Last Year: Never true  Transportation Needs: No Transportation Needs (04/03/2023)   PRAPARE - Administrator, Civil Service (Medical): No    Lack of Transportation (Non-Medical): No  Physical Activity: Insufficiently Active (04/03/2023)   Exercise Vital Sign    Days of Exercise per Week: 7 days    Minutes of Exercise per Session: 20 min  Stress: No Stress Concern Present (04/03/2023)   [employer] of Occupational Health - Occupational Stress Questionnaire    Feeling of Stress : Only a little  Social Connections: Moderately Isolated (04/03/2023)   Social Connection and Isolation Panel [NHANES]    Frequency of Communication with Friends and Family: Twice a week    Frequency of Social Gatherings with Friends and Family: Once a week    Attends Religious Services: Never    Database administrator or Organizations: No    Attends Banker Meetings: Never    Marital Status: Living with partner  Intimate Partner Violence: Not At Risk (04/03/2023)   Humiliation, Afraid, Rape, and Kick questionnaire    Fear of Current or Ex-Partner: No    Emotionally Abused: No    Physically Abused: No    Sexually Abused: No     Constitutional: Pt reports headache. Denies fever, malaise, fatigue, or abrupt weight changes.  HEENT: Denies eye pain, eye redness, ear pain,  ringing in the ears, wax buildup, runny nose, nasal congestion, bloody nose, or sore throat. Respiratory: Denies difficulty breathing, shortness of breath, cough or sputum production.   Cardiovascular: Pt reports palpitations. Denies chest pain, chest tightness, or swelling in the hands or feet.  Gastrointestinal: Pt reports poor appetite, constipation. Denies abdominal pain, bloating, constipation, diarrhea or blood in the stool.  GU: Denies urgency, frequency, pain with urination, burning sensation, blood in urine, odor or discharge. Musculoskeletal: Patient reports muscle and joint pain.  Denies decrease in range of motion, difficulty with gait, or joint pain and swelling.  Skin: Patient reports intermittent cold sores, dry skin.  Denies redness, rashes.  Neurological: Patient reports insomnia.  Denies dizziness, difficulty with memory, difficulty with speech or problems with balance and coordination.  Psych: Patient has a history of anxiety.  Denies depression, SI/HI.  No other specific complaints in a complete review of systems (except as listed in HPI above).  Objective:   Physical Exam  BP (!) 142/78 (BP Location: Left Arm, Patient Position: Sitting, Cuff Size: Normal)   Pulse (!) 110   Ht 5\' 7"  (1.702 m)   Wt 132 lb 12.8 oz (60.2 kg)   SpO2 99%   BMI 20.80 kg/m    Wt Readings from Last 3 Encounters:  11/11/22 134 lb 14.4 oz (61.2 kg)  10/07/22 134 lb (60.8 kg)  09/09/22 132 lb 12.8 oz (60.2 kg)    General: Appears her stated age, well developed, well nourished in NAD. Skin: Warm, dry and intact.  HEENT: Head: normal shape and size; Eyes: sclera white, no icterus, conjunctiva pink, PERRLA and EOMs intact;  Cardiovascular: Tachycardic with normal rhythm. S1,S2 noted.  No murmur, rubs or gallops noted. No JVD or BLE edema. No carotid bruits noted. Pulmonary/Chest: Normal effort and positive vesicular breath sounds. No respiratory distress. No wheezes, rales or ronchi noted.   Musculoskeletal:  No difficulty with gait.  Neurological: Alert and oriented. Coordination normal.  Psychiatric: Mood and affect normal.  Anxious appearing. Judgment and thought content normal.    BMET    Component Value Date/Time   NA 139 10/07/2022 1058   K 4.5 10/07/2022 1058   CL 104 10/07/2022 1058   CO2 28 10/07/2022 1058   GLUCOSE 89 10/07/2022 1058   BUN 7 10/07/2022 1058   CREATININE 0.72 10/07/2022 1058   CALCIUM 9.6 10/07/2022 1058   GFRNONAA >60 10/09/2020 1143   GFRNONAA 86 07/19/2020 1026   GFRAA 100 07/19/2020 1026    Lipid Panel     Component Value Date/Time   CHOL 152 10/07/2022 1058   TRIG 166 (H) 10/07/2022 1058   HDL 51 10/07/2022 1058   CHOLHDL 3.0 10/07/2022 1058   LDLCALC 75 10/07/2022 1058    CBC    Component Value Date/Time   WBC 7.2 10/07/2022 1058   RBC 4.62 10/07/2022 1058   HGB 14.0 10/07/2022 1058   HCT 41.9 10/07/2022 1058   PLT 250 10/07/2022 1058   MCV 90.7 10/07/2022 1058   MCH 30.3 10/07/2022 1058   MCHC 33.4 10/07/2022 1058   RDW 13.0 10/07/2022 1058   LYMPHSABS 2.0 10/09/2020 1143   MONOABS 0.5 10/09/2020 1143   EOSABS 0.1 10/09/2020 1143   BASOSABS 0.0 10/09/2020 1143    Hgb A1C Lab Results  Component Value Date   HGBA1C 5.8 (H) 10/07/2022           Assessment & Plan:   Palpitations:  Will check TSH today Could be anxiety related  RTC in 2 weeks, follow-up HTN/anxiety, 6 months for your annual exam Nicki Reaper, NP

## 2023-04-07 NOTE — Assessment & Plan Note (Signed)
 C-Met and lipid profile today Encouraged her to consume low-fat diet Continue simvastatin

## 2023-04-07 NOTE — Assessment & Plan Note (Signed)
 Will increase trazodone 250 mg at bedtime We will monitor

## 2023-04-07 NOTE — Assessment & Plan Note (Signed)
Continue Arimidex She will continue to follow with oncology 

## 2023-04-07 NOTE — Assessment & Plan Note (Signed)
Rx for mometasone cream twice daily.

## 2023-04-07 NOTE — Assessment & Plan Note (Signed)
 Deteriorated Will increase fluoxetine to 20 mg daily Support offered

## 2023-04-08 ENCOUNTER — Encounter: Payer: Self-pay | Admitting: Internal Medicine

## 2023-04-08 LAB — LIPID PANEL
Cholesterol: 168 mg/dL (ref <200)
HDL: 81 mg/dL (ref >=50)
LDL Cholesterol (Calc): 73 mg/dL
Non-HDL Cholesterol (Calc): 87 mg/dL (ref <130)
Total CHOL/HDL Ratio: 2.1 (calc) (ref <5.0)
Triglycerides: 62 mg/dL (ref <150)

## 2023-04-08 LAB — COMPLETE METABOLIC PANEL WITH GFR
AG Ratio: 2.1 (calc) (ref 1.0–2.5)
ALT: 16 U/L (ref 6–29)
AST: 20 U/L (ref 10–35)
Albumin: 4.8 g/dL (ref 3.6–5.1)
Alkaline phosphatase (APISO): 62 U/L (ref 37–153)
BUN: 7 mg/dL (ref 7–25)
CO2: 29 mmol/L (ref 20–32)
Calcium: 9.8 mg/dL (ref 8.6–10.4)
Chloride: 100 mmol/L (ref 98–110)
Creat: 0.6 mg/dL (ref 0.60–1.00)
Globulin: 2.3 g/dL (ref 1.9–3.7)
Glucose, Bld: 101 mg/dL — ABNORMAL HIGH (ref 65–99)
Potassium: 4 mmol/L (ref 3.5–5.3)
Sodium: 138 mmol/L (ref 135–146)
Total Bilirubin: 0.6 mg/dL (ref 0.2–1.2)
Total Protein: 7.1 g/dL (ref 6.1–8.1)
eGFR: 92 mL/min/{1.73_m2} (ref >=60)

## 2023-04-08 LAB — CBC
HCT: 40.3 % (ref 35.0–45.0)
Hemoglobin: 13.6 g/dL (ref 11.7–15.5)
MCH: 30.8 pg (ref 27.0–33.0)
MCHC: 33.7 g/dL (ref 32.0–36.0)
MCV: 91.2 fL (ref 80.0–100.0)
MPV: 10.6 fL (ref 7.5–12.5)
Platelets: 242 10*3/uL (ref 140–400)
RBC: 4.42 10*6/uL (ref 3.80–5.10)
RDW: 12.8 % (ref 11.0–15.0)
WBC: 9.4 10*3/uL (ref 3.8–10.8)

## 2023-04-08 LAB — HEMOGLOBIN A1C
Hgb A1c MFr Bld: 5.7 %{Hb} — ABNORMAL HIGH (ref <5.7)
Mean Plasma Glucose: 117 mg/dL
eAG (mmol/L): 6.5 mmol/L

## 2023-04-08 LAB — TSH: TSH: 1.49 m[IU]/L (ref 0.40–4.50)

## 2023-04-20 IMAGING — MG MM BREAST BX W/ LOC DEV 1ST LESION IMAGE BX SPEC STEREO GUIDE*R*
8 of 12 series · 8 of 20 positions shown · non-contrast
Comparison: Previous exams.
COMPARISON: Previous exams.

Addendum:
CLINICAL DATA: Grouped calcifications in the upper outer right
breast.

EXAM:
RIGHT BREAST STEREOTACTIC CORE NEEDLE BIOPSY

[R (1 of 5)]
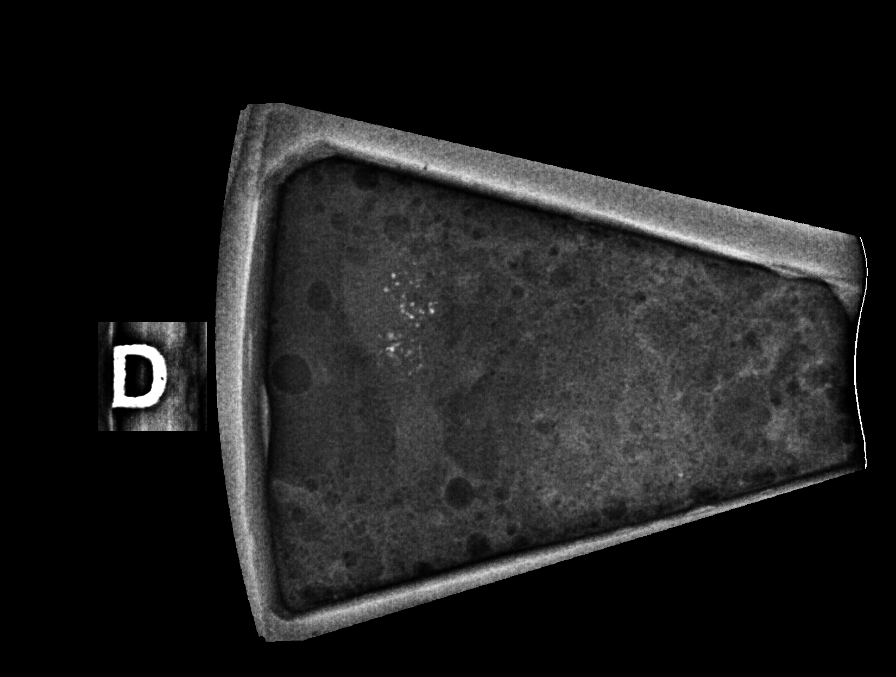

[R (2 of 5)]
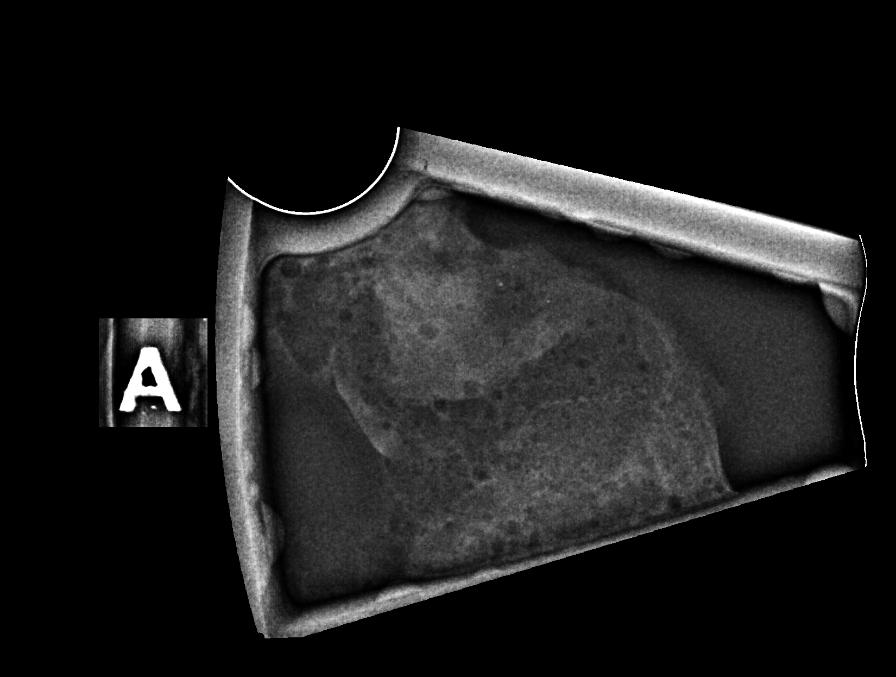

[R (3 of 5)]
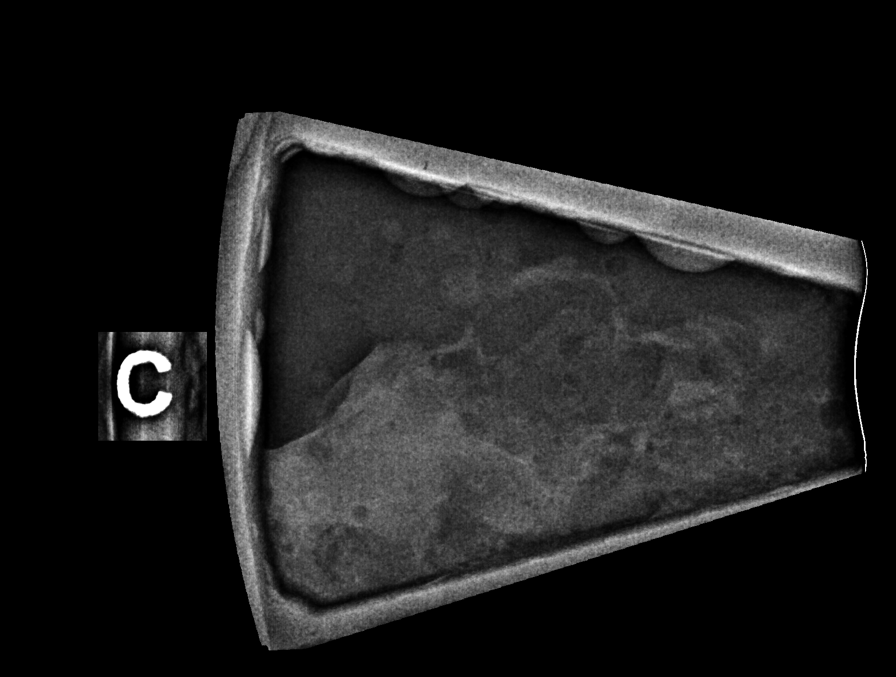

[R (4 of 5)]
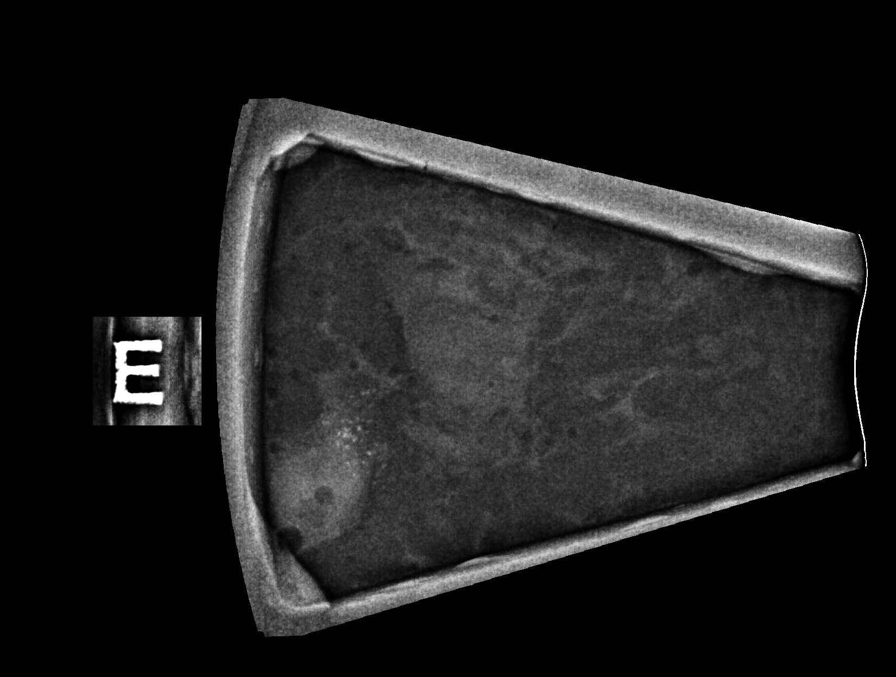

[R (5 of 5)]
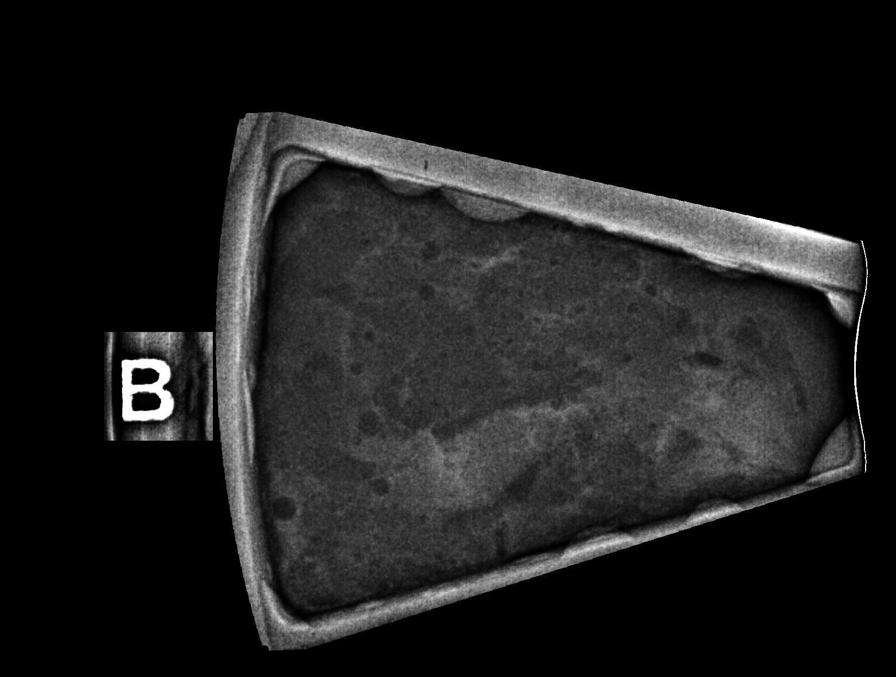

[R LM (1 of 3)]
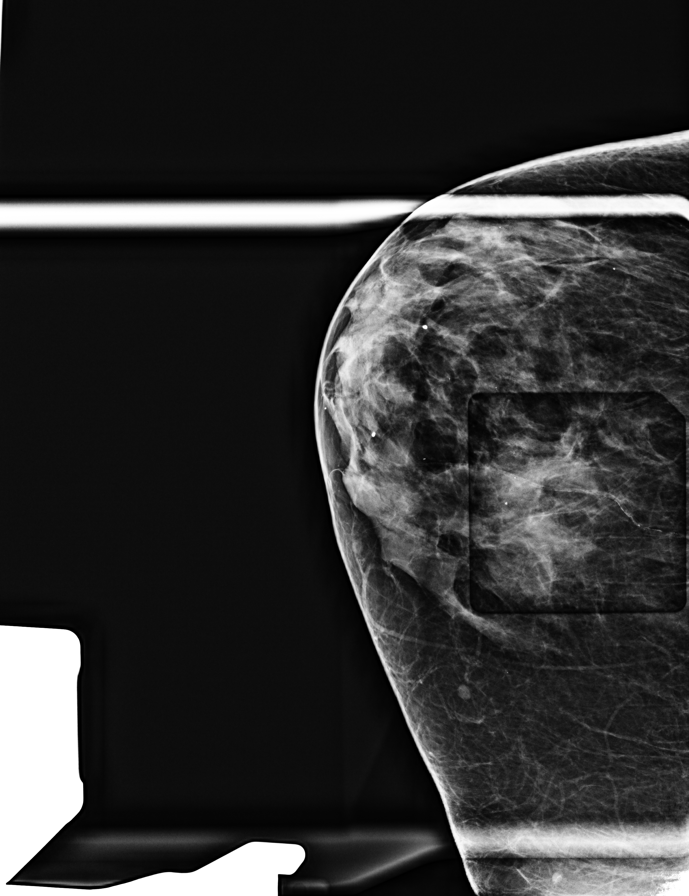

[R LM (2 of 3)]
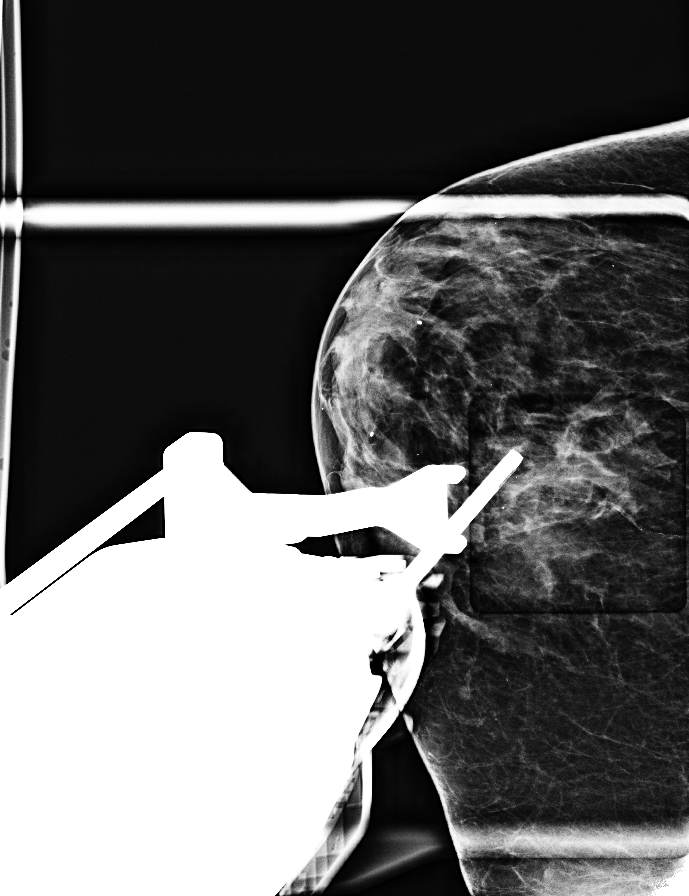

[R LM (3 of 3)]
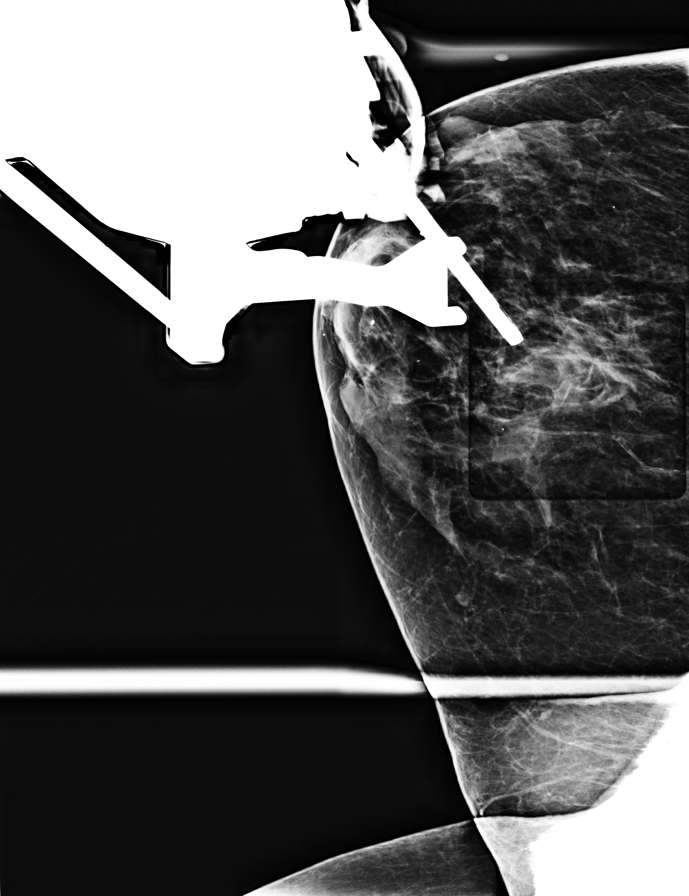

[8 of 20 positions shown; findings below may reference images not displayed]



Using sterile technique and 1% Lidocaine as local anesthetic, under
stereotactic guidance, a 9 gauge vacuum assisted device was used to
perform core needle biopsy of calcifications in the upper-outer
quadrant of the right breast using a lateral approach. Specimen
radiograph was performed showing representative calcifications.
Specimens with calcifications are identified for pathology.

Lesion quadrant: Upper outer quadrant

At the conclusion of the procedure, a ribbon shaped tissue marker
clip was deployed into the biopsy cavity. Follow-up 2-view mammogram
was performed and dictated separately.
IMPRESSION: Stereotactic-guided biopsy of grouped calcifications in the
upper-outer right breast. No apparent complications.

ADDENDUM:
PATHOLOGY revealed: A. BREAST, RIGHT UPPER OUTER QUADRANT;
STEREOTACTIC CORE NEEDLE BIOPSY: - DUCTAL CARCINOMA IN SITU,
LOW-GRADE, WITH ASSOCIATED CALCIFICATIONS. - NO EVIDENCE OF INVASIVE
MAMMARY CARCINOMA. Comment: DCIS is present in 2 of multiple tissue
cores, spanning up to 4 mm in greatest linear extent.

Pathology results are CONCORDANT with imaging findings, per Dr.
Polin Billiot.

Pathology results and recommendations were discussed with patient
via telephone on 09/17/2020. Patient reported doing well after the
biopsy with no adverse symptoms, and only slight tenderness at the
site. Post biopsy care instructions were reviewed, questions were
answered and my direct phone number was provided. Patient was
instructed to call [HOSPITAL] for any additional
questions or concerns related to biopsy site.

Recommend surgical consultation: Request for surgical consultation
relayed to Goodbody Tekova RN and Audrone Marija Burdulis RN at [HOSPITAL]
[HOSPITAL] by Gulton Sivelsaint RN on 09/17/2020.

Pathology results reported by Gulton Sivelsaint RN on 09/17/2020.



Using sterile technique and 1% Lidocaine as local anesthetic, under
stereotactic guidance, a 9 gauge vacuum assisted device was used to
perform core needle biopsy of calcifications in the upper-outer
quadrant of the right breast using a lateral approach. Specimen
radiograph was performed showing representative calcifications.
Specimens with calcifications are identified for pathology.

Lesion quadrant: Upper outer quadrant

At the conclusion of the procedure, a ribbon shaped tissue marker
clip was deployed into the biopsy cavity. Follow-up 2-view mammogram
was performed and dictated separately.
IMPRESSION: Stereotactic-guided biopsy of grouped calcifications in the
upper-outer right breast. No apparent complications.

## 2023-04-22 ENCOUNTER — Ambulatory Visit: Payer: Medicare Other | Admitting: Radiation Oncology

## 2023-04-23 ENCOUNTER — Encounter: Payer: Self-pay | Admitting: Internal Medicine

## 2023-04-23 ENCOUNTER — Ambulatory Visit (INDEPENDENT_AMBULATORY_CARE_PROVIDER_SITE_OTHER): Admitting: Internal Medicine

## 2023-04-23 VITALS — BP 138/76 | HR 86 | Ht 67.0 in | Wt 130.8 lb

## 2023-04-23 DIAGNOSIS — F419 Anxiety disorder, unspecified: Secondary | ICD-10-CM | POA: Diagnosis not present

## 2023-04-23 DIAGNOSIS — I10 Essential (primary) hypertension: Secondary | ICD-10-CM

## 2023-04-23 DIAGNOSIS — F5104 Psychophysiologic insomnia: Secondary | ICD-10-CM

## 2023-04-23 MED ORDER — BUSPIRONE HCL 10 MG PO TABS
10.0000 mg | ORAL_TABLET | Freq: Two times a day (BID) | ORAL | 1 refills | Status: DC
Start: 2023-04-23 — End: 2023-12-17

## 2023-04-23 NOTE — Progress Notes (Signed)
 Subjective:    Patient ID: Tracie Reed, female    DOB: [birth year], 78 y.o.   MRN: [id number]  HPI  Discussed the use of AI scribe software for clinical note transcription with the patient, who gave verbal consent to proceed.  Tracie Reed is a 78 year old female with anxiety and hypertension who presents for follow-up on blood pressure and anxiety management.  She experiences persistent anxiety exacerbated by a poor financial decision and concerns about retiring amidst her partner's declining health. She describes feeling overwhelmed by simple tasks, such as eating, and experiences an increased heart rate without palpitations. Despite increasing fluoxetine and trazodone, her anxiety remains poorly controlled. She has attempted taking an additional dose of fluoxetine in the afternoon when her anxiety peaks. No thoughts of self-harm or harm to others.  Her blood pressure was elevated at her last visit, and she has been monitoring it at home, noting both her heart rate and blood pressure are higher than usual. It is suspected that her anxiety may be contributing to these elevated readings.  Her sleep has improved with trazodone, as she takes an additional dose when waking at night, which helps her return to sleep. Despite this improvement in sleep, her situational anxiety persists.  She struggles with motivation for physical activity, finding it difficult to get out of bed and perform stretches, which she previously did routinely. She plans to start walking but finds it challenging to initiate. She also lacks motivation for routine activities like getting a haircut.  She has a support system including her partner of over thirty years and two daughters who live nearby, although she does not see them often. She is an introvert and finds social interactions anxiety-inducing.      Review of Systems     Past Medical History:  Diagnosis Date   Anxiety    Breast cancer (HCC)    DCIS  (ductal carcinoma in situ) of breast    History of cataract    Personal history of radiation therapy    Pre-diabetes    last A1C in 2021 was 5.2   Sleep apnea    had uvula removed    Current Outpatient Medications  Medication Sig Dispense Refill   acyclovir ointment (ZOVIRAX) 5 % Apply 1 application topically every 3 (three) hours as needed. (Patient taking differently: Apply 1 application  topically every 3 (three) hours as needed (cold sores).) 15 g 1   anastrozole (ARIMIDEX) 1 MG tablet TAKE 1 TABLET BY MOUTH EVERY DAY 90 tablet 3   CALCIUM-VITAMIN D PO Take 2 tablets by mouth daily.     cetirizine (ZYRTEC) 10 MG tablet TAKE 1 TABLET(10 MG) BY MOUTH DAILY 90 tablet 3   diclofenac Sodium (VOLTAREN) 1 % GEL Apply 1 application topically daily.     FLUoxetine (PROZAC) 20 MG capsule Take 1 capsule (20 mg total) by mouth daily. 90 capsule 1   ibuprofen (ADVIL) 800 MG tablet Take 1 tablet (800 mg total) by mouth every 8 (eight) hours as needed. 30 tablet 0   MILK THISTLE PO Take 1 capsule by mouth daily.     mometasone (ELOCON) 0.1 % ointment Apply topically daily. 45 g 0   simvastatin (ZOCOR) 10 MG tablet TAKE 1 TABLET(10 MG) BY MOUTH AT BEDTIME 90 tablet 1   traZODone (DESYREL) 50 MG tablet Take 3 tablets (150 mg total) by mouth at bedtime.     valACYclovir (VALTREX) 1000 MG tablet TAKE 2 TABLETS AT THE START OF YOUR  COLD SORE. TAKE 1 TABLET DAILY UNTIL SORES HAVE RESOLVED. 30 tablet 1   No current facility-administered medications for this visit.    No Known Allergies  Family History  Problem Relation Age of Onset   Breast cancer Mother    Diabetes Mother    Depression Mother    Healthy Brother     Social History   Socioeconomic History   Marital status: Significant Other    Spouse name: Not on file   Number of children: 2   Years of education: Not on file   Highest education level: Bachelor's degree (e.g., BA, AB, BS)  Occupational History   Occupation: self employed     Comment: Chief Operating Officer  Tobacco Use   Smoking status: Former    Current packs/day: 0.00    Average packs/day: 0.5 packs/day for 5.0 years (2.5 ttl pk-yrs)    Types: Cigarettes    Start date: 11/12/1982    Quit date: 11/12/1987    Years since quitting: 35.4    Passive exposure: Past   Smokeless tobacco: Never  Vaping Use   Vaping status: Never Used  Substance and Sexual Activity   Alcohol use: Yes    Comment: 2 pints on weekend beer   Drug use: Not Currently    Comment: past marijuana - very remote use   Sexual activity: Yes  Other Topics Concern   Not on file  Social History Narrative   Not on file   Social Drivers of Health   Financial Resource Strain: Low Risk  (04/03/2023)   Overall Financial Resource Strain (CARDIA)    Difficulty of Paying Living Expenses: Not hard at all  Food Insecurity: No Food Insecurity (04/03/2023)   Hunger Vital Sign    Worried About Running Out of Food in the Last Year: Never true    Ran Out of Food in the Last Year: Never true  Transportation Needs: No Transportation Needs (04/03/2023)   PRAPARE - Administrator, Civil Service (Medical): No    Lack of Transportation (Non-Medical): No  Physical Activity: Insufficiently Active (04/03/2023)   Exercise Vital Sign    Days of Exercise per Week: 7 days    Minutes of Exercise per Session: 20 min  Stress: No Stress Concern Present (04/03/2023)   [employer] of Occupational Health - Occupational Stress Questionnaire    Feeling of Stress : Only a little  Social Connections: Moderately Isolated (04/03/2023)   Social Connection and Isolation Panel [NHANES]    Frequency of Communication with Friends and Family: Twice a week    Frequency of Social Gatherings with Friends and Family: Once a week    Attends Religious Services: Never    Database administrator or Organizations: No    Attends Banker Meetings: Never    Marital Status: Living with partner   Intimate Partner Violence: Not At Risk (04/03/2023)   Humiliation, Afraid, Rape, and Kick questionnaire    Fear of Current or Ex-Partner: No    Emotionally Abused: No    Physically Abused: No    Sexually Abused: No     Constitutional:  Denies fever, malaise, fatigue, headache or abrupt weight changes.  HEENT: Denies eye pain, eye redness, ear pain, ringing in the ears, wax buildup, runny nose, nasal congestion, bloody nose, or sore throat. Respiratory: Denies difficulty breathing, shortness of breath, cough or sputum production.   Cardiovascular: Patient reports M.Ed. and palpitations.  Denies chest pain, chest tightness, or swelling  in the hands or feet.  Gastrointestinal: Pt reports constipation. Denies abdominal pain, bloating, constipation, diarrhea or blood in the stool.  GU: Denies urgency, frequency, pain with urination, burning sensation, blood in urine, odor or discharge. Musculoskeletal: Patient reports muscle and joint pain.  Denies decrease in range of motion, difficulty with gait, or joint pain and swelling.  Skin: Patient reports intermittent cold sores, dry skin.  Denies redness, rashes.  Neurological: Patient reports insomnia.  Denies dizziness, difficulty with memory, difficulty with speech or problems with balance and coordination.  Psych: Patient has a history of anxiety.  Denies depression, SI/HI.  No other specific complaints in a complete review of systems (except as listed in HPI above).  Objective:   Physical Exam  BP 138/76   Pulse 86   Ht 5\' 7"  (1.702 m)   Wt 130 lb 12.8 oz (59.3 kg)   SpO2 98%   BMI 20.49 kg/m    Wt Readings from Last 3 Encounters:  04/07/23 132 lb 12.8 oz (60.2 kg)  11/11/22 134 lb 14.4 oz (61.2 kg)  10/07/22 134 lb (60.8 kg)    General: Appears her stated age, well developed, well nourished in NAD. Cardiovascular: Tachycardic with normal rhythm.  Pulmonary/Chest: Normal effort and positive vesicular breath sounds. No respiratory  distress. No wheezes, rales or ronchi noted.  Musculoskeletal:  No difficulty with gait.  Neurological: Alert and oriented. Coordination normal.  Psychiatric: Mood and affect normal.  Anxious appearing. Judgment and thought content normal.    BMET    Component Value Date/Time   NA 138 04/07/2023 1121   K 4.0 04/07/2023 1121   CL 100 04/07/2023 1121   CO2 29 04/07/2023 1121   GLUCOSE 101 (H) 04/07/2023 1121   BUN 7 04/07/2023 1121   CREATININE 0.60 04/07/2023 1121   CALCIUM 9.8 04/07/2023 1121   GFRNONAA >60 10/09/2020 1143   GFRNONAA 86 07/19/2020 1026   GFRAA 100 07/19/2020 1026    Lipid Panel     Component Value Date/Time   CHOL 168 04/07/2023 1121   TRIG 62 04/07/2023 1121   HDL 81 04/07/2023 1121   CHOLHDL 2.1 04/07/2023 1121   LDLCALC 73 04/07/2023 1121    CBC    Component Value Date/Time   WBC 9.4 04/07/2023 1121   RBC 4.42 04/07/2023 1121   HGB 13.6 04/07/2023 1121   HCT 40.3 04/07/2023 1121   PLT 242 04/07/2023 1121   MCV 91.2 04/07/2023 1121   MCH 30.8 04/07/2023 1121   MCHC 33.7 04/07/2023 1121   RDW 12.8 04/07/2023 1121   LYMPHSABS 2.0 10/09/2020 1143   MONOABS 0.5 10/09/2020 1143   EOSABS 0.1 10/09/2020 1143   BASOSABS 0.0 10/09/2020 1143    Hgb A1C Lab Results  Component Value Date   HGBA1C 5.7 (H) 04/07/2023           Assessment & Plan:   Assessment and Plan    Anxiety Chronic anxiety exacerbated by stressors. Current treatment with fluoxetine and trazodone inadequate. Discussed buspirone as a non-addictive, non-sedating option. - Initiate buspirone 10 mg three times a day as needed. - Continue fluoxetine 20 mg daily. - Monitor response to buspirone and adjust dosage if necessary. - Consider Xanax if buspirone is ineffective, use sparingly.  Depression Chronic depression with poor appetite, weight loss, and lack of motivation. Current treatment with fluoxetine 20 mg daily. Discussed potential dosage increase if necessary. -  Continue fluoxetine 20 mg daily. - Monitor depressive symptoms and consider increasing fluoxetine to 40  mg if needed.  Hypertension Blood pressure slightly improved. No current need for antihypertensive medication. - Monitor blood pressure. - Reevaluate need for antihypertensive medication in six months.   RTC in 6 months for your annual exam Nicki Reaper, NP

## 2023-04-23 NOTE — Patient Instructions (Signed)
 Managing Anxiety, Adult  After being diagnosed with anxiety, you may be relieved to know why you have felt or behaved a certain way. You may also feel overwhelmed about the treatment ahead and what it will mean for your life. With care and support, you can manage your anxiety.  How to manage lifestyle changes  Understanding the difference between stress and anxiety  Although stress can play a role in anxiety, it is not the same as anxiety. Stress is your body's reaction to life changes and events, both good and bad. Stress is often caused by something external, such as a deadline, test, or competition. It normally goes away after the event has ended and will last just a few hours. But, stress can be ongoing and can lead to more than just stress.  Anxiety is caused by something internal, such as imagining a terrible outcome or worrying that something will go wrong that will greatly upset you. Anxiety often does not go away even after the event is over, and it can become a long-term (chronic) worry.  Lowering stress and anxiety    Talk with your health care provider or a counselor to learn more about lowering anxiety and stress. They may suggest tension-reduction techniques, such as:  Music. Spend time creating or listening to music that you enjoy and that inspires you.  Mindfulness-based meditation. Practice being aware of your normal breaths while not trying to control your breathing. It can be done while sitting or walking.  Centering prayer. Focus on a word, phrase, or sacred image that means something to you and brings you peace.  Deep breathing. Expand your stomach and inhale slowly through your nose. Hold your breath for 3-5 seconds. Then breathe out slowly, letting your stomach muscles relax.  Self-talk. Learn to notice and spot thought patterns that lead to anxiety reactions. Change those patterns to thoughts that feel peaceful.  Muscle relaxation. Take time to tense muscles and then relax them.  Choose a  tension-reduction technique that fits your lifestyle and personality. These techniques take time and practice. Set aside 5-15 minutes a day to do them. Specialized therapists can offer counseling and training in these techniques. The training to help with anxiety may be covered by some insurance plans.  Other things you can do to manage stress and anxiety include:  Keeping a stress diary. This can help you learn what triggers your reaction and then learn ways to manage your response.  Thinking about how you react to certain situations. You may not be able to control everything, but you can control your response.  Making time for activities that help you relax and not feeling guilty about spending your time in this way.  Doing visual imagery. This involves imagining or creating mental pictures to help you relax.  Practicing yoga. Through yoga poses, you can lower tension and relax.     Medicines  Medicines for anxiety include:  Antidepressant medicines. These are usually prescribed for long-term daily control.  Anti-anxiety medicines. These may be added in severe cases, especially when panic attacks occur.  When used together, medicines, psychotherapy, and tension-reduction techniques may be the most effective treatment.  Relationships  Relationships can play a big part in helping you recover. Spend more time connecting with trusted friends and family members. Think about going to couples counseling if you have a partner, taking family education classes, or going to family therapy. Therapy can help you and others better understand your anxiety.  How to recognize changes in  your anxiety  Everyone responds differently to treatment for anxiety. Recovery from anxiety happens when symptoms lessen and stop interfering with your daily life at home or work. This may mean that you will start to:  Have better concentration and focus. Worry will interfere less in your daily thinking.  Sleep better.  Be less irritable.  Have  more energy.  Have improved memory.  Try to recognize when your condition is getting worse. Contact your provider if your symptoms interfere with home or work and you feel like your condition is not improving.  Follow these instructions at home:  Activity  Exercise. Adults should:  Exercise for at least 150 minutes each week. The exercise should increase your heart rate and make you sweat (moderate-intensity exercise).  Do strengthening exercises at least twice a week.  Get the right amount and quality of sleep. Most adults need 7-9 hours of sleep each night.  Lifestyle    Eat a healthy diet that includes plenty of vegetables, fruits, whole grains, low-fat dairy products, and lean protein.  Do not eat a lot of foods that are high in fats, added sugars, or salt (sodium).  Make choices that simplify your life.  Do not use any products that contain nicotine or tobacco. These products include cigarettes, chewing tobacco, and vaping devices, such as e-cigarettes. If you need help quitting, ask your provider.  Avoid caffeine, alcohol, and certain over-the-counter cold medicines. These may make you feel worse. Ask your pharmacist which medicines to avoid.  General instructions  Take over-the-counter and prescription medicines only as told by your provider.  Keep all follow-up visits. This is to make sure you are managing your anxiety well or if you need more support.  Where to find support  You can get help and support from:  Self-help groups.  Online and [employer].  A trusted spiritual leader.  Couples counseling.  Family education classes.  Family therapy.  Where to find more information  You may find that joining a support group helps you deal with your anxiety. The following sources can help you find counselors or support groups near you:  Mental Health America: mentalhealthamerica.net  Anxiety and Depression Association of [place] (ADAA): adaa.org  The [employer] on Mental Illness (NAMI):  nami.org  Contact a health care provider if:  You have a hard time staying focused or finishing tasks.  You spend many hours a day feeling worried about everyday life.  You are very tired because you cannot stop worrying.  You start to have headaches or often feel tense.  You have chronic nausea or diarrhea.  Get help right away if:  Your heart feels like it is racing.  You have shortness of breath.  You have thoughts of hurting yourself or others.  Get help right away if you feel like you may hurt yourself or others, or have thoughts about taking your own life. Go to your nearest emergency room or:  Call 911.  Call the National Suicide Prevention Lifeline at 2043122231 or 988. This is open 24 hours a day.  Text the Crisis Text Line at (832)170-7952.  This information is not intended to replace advice given to you by your health care provider. Make sure you discuss any questions you have with your health care provider.  Document Revised: 10/22/2021 Document Reviewed: 05/06/2020  Elsevier Patient Education  2024 [employer].

## 2023-04-30 ENCOUNTER — Ambulatory Visit
Admission: RE | Admit: 2023-04-30 | Discharge: 2023-04-30 | Disposition: A | Discharge status: Home / Self Care | Source: Ambulatory Visit | Attending: Radiation Oncology | Admitting: Radiation Oncology

## 2023-04-30 VITALS — BP 123/81 | Temp 99.5°F | Resp 16 | Wt 138.0 lb

## 2023-04-30 DIAGNOSIS — D0511 Intraductal carcinoma in situ of right breast: Secondary | ICD-10-CM | POA: Diagnosis not present

## 2023-04-30 DIAGNOSIS — C50919 Malignant neoplasm of unspecified site of unspecified female breast: Secondary | ICD-10-CM

## 2023-04-30 DIAGNOSIS — Z170 Estrogen receptor positive status [ER+]: Secondary | ICD-10-CM | POA: Diagnosis not present

## 2023-04-30 NOTE — Progress Notes (Signed)
 Radiation Oncology Follow up Note  Name: Tracie Reed   Date:   04/30/2023 MRN:  [id number] DOB: [birth year]    This 78 y.o. 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  REFERRING PROVIDER: Lorre Munroe, NP  HPI: Patient is a 78 year old female now out close to 4 years having a pleated whole breast radiation to her right breast for ER positive DCIS.  Seen today in routine follow-up she is doing well.  She specifically denies breast tenderness cough or bone pain.  She is currently on Arimidex tolerating it well without side effect..  She had mammograms back in August which I have reviewed were BI-RADS 2 benign.  COMPLICATIONS OF TREATMENT: none  FOLLOW UP COMPLIANCE: keeps appointments   PHYSICAL EXAM:  BP 123/81 (BP Location: Left Arm, Patient Position: Sitting, Cuff Size: Normal)   Temp 99.5 F (37.5 C) (Tympanic)   Resp 16   Wt 138 lb (62.6 kg)   BMI 21.61 kg/m  Lungs are clear to A&P cardiac examination essentially unremarkable with regular rate and rhythm. No dominant mass or nodularity is noted in either breast in 2 positions examined. Incision is well-healed. No axillary or supraclavicular adenopathy is appreciated. Cosmetic result is excellent.  Well-developed well-nourished patient in NAD. HEENT reveals PERLA, EOMI, discs not visualized.  Oral cavity is clear. No oral mucosal lesions are identified. Neck is clear without evidence of cervical or supraclavicular adenopathy. Lungs are clear to A&P. Cardiac examination is essentially unremarkable with regular rate and rhythm without murmur rub or thrill. Abdomen is benign with no organomegaly or masses noted. Motor sensory and DTR levels are equal and symmetric in the upper and lower extremities. Cranial nerves II through XII are grossly intact. Proprioception is intact. No peripheral adenopathy or edema is  identified. No motor or sensory levels are noted. Crude visual fields are within normal range.  RADIOLOGY RESULTS: Mammograms reviewed compatible with above-stated findings  PLAN: The present time patient is close to 4 years with no evidence of disease for ER positive ductal carcinoma in situ.  At this time I am going to discontinue follow-up care.  She continues close follow-up care through medical oncology.  Mammograms will be ordered again around this August.  She continues on Arimidex without side effect.    Carmina Miller, MD

## 2023-05-12 ENCOUNTER — Inpatient Hospital Stay: Payer: Medicare Other | Attending: Oncology | Admitting: Oncology

## 2023-05-12 ENCOUNTER — Encounter: Payer: Self-pay | Admitting: Oncology

## 2023-05-12 VITALS — BP 125/72 | HR 80 | Temp 98.4°F | Resp 18 | Ht 67.0 in | Wt 132.1 lb

## 2023-05-12 DIAGNOSIS — Z86000 Personal history of in-situ neoplasm of breast: Secondary | ICD-10-CM | POA: Diagnosis not present

## 2023-05-12 DIAGNOSIS — M85852 Other specified disorders of bone density and structure, left thigh: Secondary | ICD-10-CM | POA: Insufficient documentation

## 2023-05-12 DIAGNOSIS — Z853 Personal history of malignant neoplasm of breast: Secondary | ICD-10-CM

## 2023-05-12 DIAGNOSIS — Z170 Estrogen receptor positive status [ER+]: Secondary | ICD-10-CM | POA: Diagnosis not present

## 2023-05-12 DIAGNOSIS — Z08 Encounter for follow-up examination after completed treatment for malignant neoplasm: Secondary | ICD-10-CM | POA: Diagnosis not present

## 2023-05-12 DIAGNOSIS — D0511 Intraductal carcinoma in situ of right breast: Secondary | ICD-10-CM | POA: Diagnosis not present

## 2023-05-12 DIAGNOSIS — Z79811 Long term (current) use of aromatase inhibitors: Secondary | ICD-10-CM | POA: Insufficient documentation

## 2023-05-12 DIAGNOSIS — Z79899 Other long term (current) drug therapy: Secondary | ICD-10-CM | POA: Diagnosis not present

## 2023-05-12 DIAGNOSIS — Z5181 Encounter for therapeutic drug level monitoring: Secondary | ICD-10-CM

## 2023-05-12 NOTE — Addendum Note (Signed)
 Addended by: Marilyn Shropshire on: 05/12/2023 01:25 PM   Modules accepted: Orders

## 2023-05-12 NOTE — Progress Notes (Signed)
 Hematology/Oncology Consult note [facility]  Telephone:([phone] [phone]  Patient Care Team: Lorre Munroe, NP as PCP - General (Internal Medicine) Jim Like, RN as Oncology Nurse Navigator Creig Hines, MD as Consulting Physician (Oncology) Carmina Miller, MD as Consulting Physician (Radiation Oncology) Campbell Lerner, MD as Consulting Physician (General Surgery)   Name of the patient: Tracie Reed  [id number]  Jul 23, 1945   Date of visit: 05/12/23  Diagnosis-right breast DCIS ER positive  Chief complaint/ Reason for visit-routine follow-up of DCIS  Heme/Onc history: Patient is a 78 year old female who was 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  There was an additional medial margin that was excised which showed an isolated focus of intermediate grade DCIS.  On biopsy the size of DCIS was 4 mm.  Margins negative.  ER greater than 90% positive.  Patient underwent postlumpectomy radiation treatment.  She did start on Arimidex in January 2023, stopped it briefly and then restarted in July 2023.   Interval history-tolerating Arimidex well along with calcium and vitamin D without any significant side effects.  She has not missed any doses.  Denies any breast concerns.  Denies any changes in her appetite and weight or new aches and pains anywhere.  She was started on BuSpar for anxiety recently.  ECOG PS- 1 Pain scale- 0   Review of systems- Review of Systems  Constitutional:  Negative for chills, fever, malaise/fatigue and weight loss.  HENT:  Negative for congestion, ear discharge and nosebleeds.   Eyes:  Negative for blurred vision.  Respiratory:  Negative for cough, hemoptysis, sputum production, shortness of breath and wheezing.   Cardiovascular:  Negative for chest pain, palpitations, orthopnea and claudication.   Gastrointestinal:  Negative for abdominal pain, blood in stool, constipation, diarrhea, heartburn, melena, nausea and vomiting.  Genitourinary:  Negative for dysuria, flank pain, frequency, hematuria and urgency.  Musculoskeletal:  Negative for back pain, joint pain and myalgias.  Skin:  Negative for rash.  Neurological:  Negative for dizziness, tingling, focal weakness, seizures, weakness and headaches.  Endo/Heme/Allergies:  Does not bruise/bleed easily.  Psychiatric/Behavioral:  Negative for depression and suicidal ideas. The patient does not have insomnia.       No Known Allergies   Past Medical History:  Diagnosis Date   Anxiety    Breast cancer (HCC)    DCIS (ductal carcinoma in situ) of breast    History of cataract    Personal history of radiation therapy    Pre-diabetes    last A1C in 2021 was 5.2   Sleep apnea    had uvula removed     Past Surgical History:  Procedure Laterality Date   BREAST BIOPSY Right 09/14/2020   Affirm bx-calcs, "Ribbon" marker-path pending   BREAST EXCISIONAL BIOPSY Right 1993   neg   BREAST LUMPECTOMY Right 10/12/2020   BREAST LUMPECTOMY WITH RADIOFREQUENCY TAG IDENTIFICATION Right 10/12/2020   Procedure: BREAST LUMPECTOMY WITH RADIOFREQUENCY TAG IDENTIFICATION;  Surgeon: Campbell Lerner, MD;  Location: [facility] ORS;  Service: General;  Laterality: Right;   BUNIONECTOMY Bilateral    CATARACT EXTRACTION  09/03/2020   CHOLECYSTECTOMY     COLONOSCOPY WITH PROPOFOL N/A 07/31/2020   Procedure: COLONOSCOPY WITH PROPOFOL;  Surgeon: Midge Minium, MD;  Location: [facility] ENDOSCOPY;  Service: Endoscopy;  Laterality: N/A;   IRIDOTOMY / IRIDECTOMY Right    UVULECTOMY      Social  History   Socioeconomic History   Marital status: Significant Other    Spouse name: Not on file   Number of children: 2   Years of education: Not on file   Highest education level: Bachelor's degree (e.g., BA, AB, BS)  Occupational History   Occupation: self employed     Comment: Chief Operating Officer  Tobacco Use   Smoking status: Former    Current packs/day: 0.00    Average packs/day: 0.5 packs/day for 5.0 years (2.5 ttl pk-yrs)    Types: Cigarettes    Start date: 11/12/1982    Quit date: 11/12/1987    Years since quitting: 35.5    Passive exposure: Past   Smokeless tobacco: Never  Vaping Use   Vaping status: Never Used  Substance and Sexual Activity   Alcohol use: Yes    Comment: 2 pints on weekend beer   Drug use: Not Currently    Comment: past marijuana - very remote use   Sexual activity: Yes  Other Topics Concern   Not on file  Social History Narrative   Not on file   Social Drivers of Health   Financial Resource Strain: Low Risk  (04/03/2023)   Overall Financial Resource Strain (CARDIA)    Difficulty of Paying Living Expenses: Not hard at all  Food Insecurity: No Food Insecurity (04/03/2023)   Hunger Vital Sign    Worried About Running Out of Food in the Last Year: Never true    Ran Out of Food in the Last Year: Never true  Transportation Needs: No Transportation Needs (04/03/2023)   PRAPARE - Administrator, Civil Service (Medical): No    Lack of Transportation (Non-Medical): No  Physical Activity: Insufficiently Active (04/03/2023)   Exercise Vital Sign    Days of Exercise per Week: 7 days    Minutes of Exercise per Session: 20 min  Stress: No Stress Concern Present (04/03/2023)   [employer] of Occupational Health - Occupational Stress Questionnaire    Feeling of Stress : Only a little  Social Connections: Moderately Isolated (04/03/2023)   Social Connection and Isolation Panel [NHANES]    Frequency of Communication with Friends and Family: Twice a week    Frequency of Social Gatherings with Friends and Family: Once a week    Attends Religious Services: Never    Database administrator or Organizations: No    Attends Banker Meetings: Never    Marital Status: Living with partner   Intimate Partner Violence: Not At Risk (04/03/2023)   Humiliation, Afraid, Rape, and Kick questionnaire    Fear of Current or Ex-Partner: No    Emotionally Abused: No    Physically Abused: No    Sexually Abused: No    Family History  Problem Relation Age of Onset   Breast cancer Mother    Diabetes Mother    Depression Mother    Healthy Brother      Current Outpatient Medications:    acyclovir ointment (ZOVIRAX) 5 %, Apply 1 application topically every 3 (three) hours as needed. (Patient taking differently: Apply 1 application  topically every 3 (three) hours as needed (cold sores).), Disp: 15 g, Rfl: 1   anastrozole (ARIMIDEX) 1 MG tablet, TAKE 1 TABLET BY MOUTH EVERY DAY, Disp: 90 tablet, Rfl: 3   busPIRone (BUSPAR) 10 MG tablet, Take 1 tablet (10 mg total) by mouth 2 (two) times daily., Disp: 270 tablet, Rfl: 1   CALCIUM-VITAMIN D PO, Take 2  tablets by mouth daily., Disp: , Rfl:    cetirizine (ZYRTEC) 10 MG tablet, TAKE 1 TABLET(10 MG) BY MOUTH DAILY, Disp: 90 tablet, Rfl: 3   diclofenac Sodium (VOLTAREN) 1 % GEL, Apply 1 application topically daily., Disp: , Rfl:    FLUoxetine (PROZAC) 20 MG capsule, Take 1 capsule (20 mg total) by mouth daily., Disp: 90 capsule, Rfl: 1   ibuprofen (ADVIL) 800 MG tablet, Take 1 tablet (800 mg total) by mouth every 8 (eight) hours as needed., Disp: 30 tablet, Rfl: 0   MILK THISTLE PO, Take 1 capsule by mouth daily., Disp: , Rfl:    mometasone (ELOCON) 0.1 % ointment, Apply topically daily., Disp: 45 g, Rfl: 0   simvastatin (ZOCOR) 10 MG tablet, TAKE 1 TABLET(10 MG) BY MOUTH AT BEDTIME, Disp: 90 tablet, Rfl: 1   traZODone (DESYREL) 50 MG tablet, Take 3 tablets (150 mg total) by mouth at bedtime., Disp: , Rfl:    valACYclovir (VALTREX) 1000 MG tablet, TAKE 2 TABLETS AT THE START OF YOUR COLD SORE. TAKE 1 TABLET DAILY UNTIL SORES HAVE RESOLVED., Disp: 30 tablet, Rfl: 1  Physical exam: There were no vitals filed for this visit. Physical  Exam Cardiovascular:     Rate and Rhythm: Normal rate and regular rhythm.     Heart sounds: Normal heart sounds.  Pulmonary:     Effort: Pulmonary effort is normal.     Breath sounds: Normal breath sounds.  Skin:    General: Skin is warm and dry.  Neurological:     Mental Status: She is alert and oriented to person, place, and time.    Breast exam was performed in seated and lying down position. Patient is status post right lumpectomy with a well-healed surgical scar. No evidence of any palpable masses. No evidence of axillary adenopathy. No evidence of any palpable masses or lumps in the left breast. No evidence of leftt axillary adenopathy   I have personally reviewed labs listed below:    Latest Ref Rng & Units 04/07/2023   11:21 AM  CMP  Glucose 65 - 99 mg/dL 829   BUN 7 - 25 mg/dL 7   Creatinine 5.62 - 1.30 mg/dL 8.65   Sodium 784 - 696 mmol/L 138   Potassium 3.5 - 5.3 mmol/L 4.0   Chloride 98 - 110 mmol/L 100   CO2 20 - 32 mmol/L 29   Calcium 8.6 - 10.4 mg/dL 9.8   Total Protein 6.1 - 8.1 g/dL 7.1   Total Bilirubin 0.2 - 1.2 mg/dL 0.6   AST 10 - 35 U/L 20   ALT 6 - 29 U/L 16       Latest Ref Rng & Units 04/07/2023   11:21 AM  CBC  WBC 3.8 - 10.8 Thousand/uL 9.4   Hemoglobin 11.7 - 15.5 g/dL 29.5   Hematocrit 28.4 - 45.0 % 40.3   Platelets 140 - 400 Thousand/uL 242      Assessment and plan- Patient is a 78 y.o. female here for routine follow-up of right breast DCIS ER positive  Clinically patient is doing well with respect to her DCIS.  No signs and symptoms of recurrence based on today's exam.  She has been on Arimidex since January 2023 and will take it for 5 years along with calcium and vitamin D.  Her last bone density scan was in August 2023 which showed osteopenia of the left femur neck with a T-score of -1.4.  Her 10-year probability of a major osteoporotic fracture  was less than 20% and hip fracture less than 3% and therefore she is not on any  bisphosphonates.  We will repeat her bone density scan again in 6 months along with a mammogram and I will see her thereafter.   Visit Diagnosis 1. Encounter for follow-up surveillance of ductal carcinoma in situ (DCIS) of breast   2. Visit for monitoring Arimidex therapy   3. High risk medication use   4. Osteopenia of neck of left femur      Dr. Seretha Dance, MD, MPH [facility] at [facility] [phone] 05/12/2023 12:53 PM

## 2023-05-21 ENCOUNTER — Other Ambulatory Visit: Payer: Self-pay | Admitting: Internal Medicine

## 2023-05-21 DIAGNOSIS — F5104 Psychophysiologic insomnia: Secondary | ICD-10-CM

## 2023-05-21 NOTE — Telephone Encounter (Unsigned)
 Copied from CRM [id number]. Topic: Clinical - Medication Refill >> May 21, 2023  7:55 AM Baldemar Lev wrote: Most Recent Primary Care Visit:  Provider: Carollynn Cirri  Department: [facility]  Visit Type: OFFICE VISIT  Date: 04/23/2023  Medication: traZODone  (DESYREL ) 50 MG tablet  Has the patient contacted their pharmacy? Yes (Agent: If no, request that the patient contact the pharmacy for the refill. If patient does not wish to contact the pharmacy document the reason why and proceed with request.) (Agent: If yes, when and what did the pharmacy advise?)  Is this the correct pharmacy for this prescription? Yes If no, delete pharmacy and type the correct one.  This is the patient's preferred pharmacy:  [id number] - [place], [place] - [address] [place]  [address] Phone: [phone] Fax: [phone]  Has the prescription been filled recently? Yes  Is the patient out of the medication? Yes  Has the patient been seen for an appointment in the last year OR does the patient have an upcoming appointment? Yes  Can we respond through MyChart? Yes  Agent: Please be advised that Rx refills may take up to 3 business days. We ask that you follow-up with your pharmacy.

## 2023-05-22 MED ORDER — TRAZODONE HCL 50 MG PO TABS: 150.0000 mg | ORAL_TABLET | Freq: Every day | ORAL | 0 refills | Status: DC

## 2023-05-22 NOTE — Telephone Encounter (Signed)
 Requested medications are due for refill today.  unsure  Requested medications are on the active medications list.  yes  Last refill. 04/07/2023 -  unknown quantity  Future visit scheduled.   yes  Notes to clinic.  Please review for refill.    Requested Prescriptions  Pending Prescriptions Disp Refills   traZODone  (DESYREL ) 50 MG tablet      Sig: Take 3 tablets (150 mg total) by mouth at bedtime.     Psychiatry: Antidepressants - Serotonin Modulator Passed - 05/22/2023  9:09 AM      Passed - Valid encounter within last 6 months    Recent Outpatient Visits           4 weeks ago Anxiety   [place] [facility] [place], [place] Buzzard, NP   1 month ago Palpitations    [facility] [place], Rankin Buzzard, [place]

## 2023-05-26 ENCOUNTER — Other Ambulatory Visit: Payer: Self-pay | Admitting: Internal Medicine

## 2023-05-26 DIAGNOSIS — F5104 Psychophysiologic insomnia: Secondary | ICD-10-CM

## 2023-05-28 NOTE — Telephone Encounter (Signed)
 Rx 05/22/23 #270- too soon Requested Prescriptions  Pending Prescriptions Disp Refills   traZODone  (DESYREL ) 50 MG tablet [Pharmacy Med Name: TRAZODONE  50MG  TABLETS] 270 tablet 0    Sig: TAKE 3 TABLETS(150 MG) BY MOUTH AT BEDTIME     Psychiatry: Antidepressants - Serotonin Modulator Passed - 05/28/2023 12:13 PM      Passed - Valid encounter within last 6 months    Recent Outpatient Visits           1 month ago Anxiety   [place] [facility] [place], Rankin Buzzard, NP   1 month ago Palpitations   [place] [facility] [place], Rankin Buzzard, [place]

## 2023-06-02 ENCOUNTER — Other Ambulatory Visit: Payer: Self-pay | Admitting: Internal Medicine

## 2023-06-02 DIAGNOSIS — F419 Anxiety disorder, unspecified: Secondary | ICD-10-CM

## 2023-06-04 NOTE — Telephone Encounter (Signed)
 Dc'd 04/03/23 R. Baity NP  Requested Prescriptions  Refused Prescriptions Disp Refills   FLUoxetine  (PROZAC ) 10 MG tablet [Pharmacy Med Name: FLUOXETINE  10MG  TABLETS] 90 tablet 0    Sig: TAKE 1 TABLET(10 MG) BY MOUTH DAILY     There is no refill protocol information for this order

## 2023-06-27 ENCOUNTER — Other Ambulatory Visit: Payer: Self-pay | Admitting: Internal Medicine

## 2023-06-29 NOTE — Telephone Encounter (Signed)
 Requested Prescriptions  Pending Prescriptions Disp Refills   simvastatin  (ZOCOR ) 10 MG tablet [Pharmacy Med Name: SIMVASTATIN  10MG  TABLETS] 90 tablet 1    Sig: TAKE 1 TABLET(10 MG) BY MOUTH AT BEDTIME     Cardiovascular:  Antilipid - Statins Failed - 06/29/2023 12:45 PM      Failed - Lipid Panel in normal range within the last 12 months    Cholesterol  Date Value Ref Range Status  04/07/2023 168 <200 mg/dL Final   LDL Cholesterol (Calc)  Date Value Ref Range Status  04/07/2023 73 mg/dL (calc) Final    Comment:    Reference range: <100 . Desirable range <100 mg/dL for primary prevention;   <70 mg/dL for patients with CHD or diabetic patients  with > or = 2 CHD risk factors. Aaron Aas LDL-C is now calculated using the Martin-Hopkins  calculation, which is a validated novel method providing  better accuracy than the Friedewald equation in the  estimation of LDL-C.  Melinda Sprawls et al. Erroll Heard. 8295;621(30): 2061-2068  (http://education.[facility].com/faq/FAQ164)    HDL  Date Value Ref Range Status  04/07/2023 81 > OR = 50 mg/dL Final   Triglycerides  Date Value Ref Range Status  04/07/2023 62 <150 mg/dL Final         Passed - Patient is not pregnant      Passed - Valid encounter within last 12 months    Recent Outpatient Visits           2 months ago Anxiety   [place] [facility] [place], Rankin Buzzard, NP   2 months ago Palpitations   [place] [facility] [place], Rankin Buzzard, [place]

## 2023-07-11 ENCOUNTER — Other Ambulatory Visit: Payer: Self-pay | Admitting: Internal Medicine

## 2023-07-13 NOTE — Telephone Encounter (Signed)
 Too soon for refill, refilled 04/07/23 for 90  and 1 RF.  Requested Prescriptions  Pending Prescriptions Disp Refills   FLUoxetine  (PROZAC ) 20 MG capsule [Pharmacy Med Name: FLUOXETINE  20MG  CAPSULES] 90 capsule 1    Sig: TAKE 1 CAPSULE(20 MG) BY MOUTH DAILY     Psychiatry:  Antidepressants - SSRI Passed - 07/13/2023  9:15 AM      Passed - Valid encounter within last 6 months    Recent Outpatient Visits           2 months ago Anxiety   [place] [facility] [place], Rankin Buzzard, NP   3 months ago Palpitations   [place] [facility] [place], Rankin Buzzard, [place]

## 2023-07-29 ENCOUNTER — Other Ambulatory Visit: Payer: Self-pay | Admitting: Internal Medicine

## 2023-07-29 DIAGNOSIS — R059 Cough, unspecified: Secondary | ICD-10-CM

## 2023-07-31 NOTE — Telephone Encounter (Signed)
 Requested Prescriptions  Pending Prescriptions Disp Refills   cetirizine  (ZYRTEC ) 10 MG tablet [Pharmacy Med Name: CETIRIZINE  10MG  TABLETS] 90 tablet 1    Sig: TAKE 1 TABLET(10 MG) BY MOUTH DAILY     Ear, Nose, and Throat:  Antihistamines 2 Passed - 07/31/2023  6:02 PM      Passed - Cr in normal range and within 360 days    Creat  Date Value Ref Range Status  04/07/2023 0.60 0.60 - 1.00 mg/dL Final         Passed - Valid encounter within last 12 months    Recent Outpatient Visits           3 months ago Anxiety   [place] [facility] [place], Angeline ORN, NP   3 months ago Palpitations   [place] [facility] [place], Angeline ORN, [place]

## 2023-08-11 ENCOUNTER — Other Ambulatory Visit: Payer: Self-pay | Admitting: Internal Medicine

## 2023-08-11 DIAGNOSIS — B001 Herpesviral vesicular dermatitis: Secondary | ICD-10-CM

## 2023-08-12 NOTE — Telephone Encounter (Signed)
 Requested Prescriptions  Pending Prescriptions Disp Refills   valACYclovir  (VALTREX ) 1000 MG tablet [Pharmacy Med Name: VALACYCLOVIR  1GM TABLETS] 30 tablet 1    Sig: TAKE 2 TABLETS BY MOUTH AT START OF COLD SORE, THEN TAKE 1 TABLET DAILY UNTIL SORES HAVE RESOLVED     Antimicrobials:  Antiviral Agents - Anti-Herpetic Passed - 08/12/2023  2:28 PM      Passed - Valid encounter within last 12 months    Recent Outpatient Visits           3 months ago Anxiety   [place] [facility] [place], Angeline ORN, NP   4 months ago Palpitations   [place] [facility] [place], Angeline ORN, [place]

## 2023-08-19 NOTE — Telephone Encounter (Unsigned)
 Copied from CRM [id number]. Topic: Clinical - Medication Refill >> Aug 19, 2023  2:54 PM Harlene ORN wrote: Medication: traZODone  (DESYREL ) 50 MG tablet  Has the patient contacted their pharmacy? Yes (Agent: If no, request that the patient contact the pharmacy for the refill. If patient does not wish to contact the pharmacy document the reason why and proceed with request.) (Agent: If yes, when and what did the pharmacy advise?)  This is the patient's preferred pharmacy:  [id number] - [place], [place] - [address] [place] [address] Phone: [phone] Fax: [phone]  Is this the correct pharmacy for this prescription? Yes If no, delete pharmacy and type the correct one.   Has the prescription been filled recently? No  Is the patient out of the medication? No  Has the patient been seen for an appointment in the last year OR does the patient have an upcoming appointment? Yes  Can we respond through MyChart? Yes  Agent: Please be advised that Rx refills may take up to 3 business days. We ask that you follow-up with your pharmacy.

## 2023-08-25 ENCOUNTER — Other Ambulatory Visit: Payer: Self-pay | Admitting: Internal Medicine

## 2023-08-25 DIAGNOSIS — F5104 Psychophysiologic insomnia: Secondary | ICD-10-CM

## 2023-08-26 NOTE — Telephone Encounter (Signed)
 Requested Prescriptions  Pending Prescriptions Disp Refills   traZODone  (DESYREL ) 50 MG tablet [Pharmacy Med Name: TRAZODONE  50MG  TABLETS] 270 tablet 0    Sig: TAKE 3 TABLETS(150 MG) BY MOUTH AT BEDTIME     Psychiatry: Antidepressants - Serotonin Modulator Passed - 08/26/2023  1:58 PM      Passed - Valid encounter within last 6 months    Recent Outpatient Visits           4 months ago Anxiety   [place] [facility] [place], Angeline ORN, NP   4 months ago Palpitations   [place] [facility] [place], Angeline ORN, [place]

## 2023-08-28 DIAGNOSIS — K080 Exfoliation of teeth due to systemic causes: Secondary | ICD-10-CM | POA: Diagnosis not present

## 2023-08-29 ENCOUNTER — Other Ambulatory Visit: Payer: Self-pay | Admitting: Internal Medicine

## 2023-08-29 DIAGNOSIS — F5104 Psychophysiologic insomnia: Secondary | ICD-10-CM

## 2023-08-31 NOTE — Telephone Encounter (Signed)
 Duplicate request, refilled 08/26/23.  Requested Prescriptions  Pending Prescriptions Disp Refills   traZODone  (DESYREL ) 50 MG tablet [Pharmacy Med Name: TRAZODONE  50MG  TABLETS] 270 tablet 0    Sig: TAKE 3 TABLETS(150 MG) BY MOUTH AT BEDTIME     Psychiatry: Antidepressants - Serotonin Modulator Passed - 08/31/2023  3:09 PM      Passed - Valid encounter within last 6 months    Recent Outpatient Visits           4 months ago Anxiety   [place] [facility] [place], Angeline ORN, NP   4 months ago Palpitations   [place] [facility] [place], Angeline ORN, [place]

## 2023-09-01 ENCOUNTER — Ambulatory Visit
Admission: RE | Admit: 2023-09-01 | Discharge: 2023-09-01 | Disposition: A | Discharge status: Home / Self Care | Source: Ambulatory Visit | Attending: Oncology

## 2023-09-01 ENCOUNTER — Ambulatory Visit
Admission: RE | Admit: 2023-09-01 | Discharge: 2023-09-01 | Disposition: A | Discharge status: Home / Self Care | Source: Ambulatory Visit | Attending: Oncology | Admitting: Oncology

## 2023-09-01 DIAGNOSIS — Z08 Encounter for follow-up examination after completed treatment for malignant neoplasm: Secondary | ICD-10-CM | POA: Diagnosis not present

## 2023-09-01 DIAGNOSIS — Z853 Personal history of malignant neoplasm of breast: Secondary | ICD-10-CM | POA: Insufficient documentation

## 2023-09-01 DIAGNOSIS — Z5181 Encounter for therapeutic drug level monitoring: Secondary | ICD-10-CM

## 2023-09-01 DIAGNOSIS — Z86000 Personal history of in-situ neoplasm of breast: Secondary | ICD-10-CM | POA: Insufficient documentation

## 2023-09-01 DIAGNOSIS — Z79811 Long term (current) use of aromatase inhibitors: Secondary | ICD-10-CM | POA: Diagnosis not present

## 2023-09-01 DIAGNOSIS — R92333 Mammographic heterogeneous density, bilateral breasts: Secondary | ICD-10-CM | POA: Diagnosis not present

## 2023-09-01 DIAGNOSIS — Z780 Asymptomatic menopausal state: Secondary | ICD-10-CM | POA: Diagnosis not present

## 2023-09-01 DIAGNOSIS — M8580 Other specified disorders of bone density and structure, unspecified site: Secondary | ICD-10-CM | POA: Diagnosis not present

## 2023-09-08 NOTE — Progress Notes (Signed)
 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  About 1 year follow the last evaluation.   Post XRT, she continues to take her Arimidex  without issue.  She reports she is tolerating it well.   Review of Systems:  Constitutional: denies fever/chills  ENT: denies sore throat, hearing problems  Respiratory: denies shortness of breath, wheezing  Cardiovascular: denies chest pain, palpitations  Gastrointestinal: denies abdominal pain, N/V, or diarrhea/and bowel function as per interval history Skin: Denies any other rashes or skin discolorations except post-surgical wounds as per interval history  Vital Signs:  BP 123/82   Pulse 85   Ht 5' 7 (1.702 m)   Wt 131 lb (59.4 kg)   SpO2 98%   BMI 20.52 kg/m    Physical Exam:  Constitutional:  -- Normal body habitus  -- Awake, alert, and oriented x3  Pulmonary:  -- Breathing non-labored at rest Cardiovascular:  -- Well perfused Gastrointestinal:  -- Soft and non-distended, non-tender GU  --Caryl Lyn present as chaperone.  Right breast evaluated, lateral scar appears to be well-healed.  Minimal radiation changes appreciated.  No other suspicious, nor dominant nodularity present.  No appreciable skin changes.  The left breast remains unremarkable, no nodularity appreciable. 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   CLINICAL DATA:  Status post RIGHT breast lumpectomy in 2022 for DCIS. Negative margins. Status post radiation. On anastrozole . For annual.   EXAM: DIGITAL DIAGNOSTIC BILATERAL MAMMOGRAM WITH TOMOSYNTHESIS AND CAD   TECHNIQUE: Bilateral digital diagnostic mammography and breast tomosynthesis was performed. The  images were evaluated with computer-aided detection.   COMPARISON:  Previous exam(s).   ACR Breast Density Category c: The breasts are heterogeneously dense, which may obscure small masses.   FINDINGS: There is density and architectural distortion within the RIGHT breast, corresponding to the site of prior lumpectomy. Spot compression magnification view(s) demonstrate no evidence of recurrent malignancy. Findings are stable compared to prior. There is no mammographic evidence of malignancy in EITHER breast.   IMPRESSION: No mammographic evidence of malignancy in EITHER breast.   RECOMMENDATION: As the patient is now over 2 years out from her lumpectomy, she may return to annual screening mammography in 1 year. Given her history of breast cancer, she remains eligible for annual diagnostic mammography, if preferred.   I have discussed the findings and recommendations with the patient. If applicable, a reminder letter will be sent to the patient regarding the next appointment.   BI-RADS CATEGORY  2: Benign.     Electronically Signed   By: Norleen Croak M.D.   On: 09/01/2023 11:05  Assessment:  78 y.o. yo Female with a problem list including...  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  Plan:              - return to clinic in 12 months or as needed,  instructed to call office if any questions or concerns.    As the patient is now over 2 years out from her lumpectomy, she may return to annual screening mammography in 1 year. Given her history of breast cancer, she remains eligible for annual  diagnostic mammography, if preferred.  All of the above recommendations were discussed with the patient, and all of patient'squestions were answered to her expressed satisfaction.  I personally spent a total of 20 minutes in the care of the patient today including preparing to see the patient, getting/reviewing separately obtained history, documenting clinical information in the EHR, independently interpreting results, and discussing future imaging options.  These notes generated with voice recognition software. I apologize for typographical errors.  Honor Leghorn, MD, FACS [place]: [facility] General Surgery - Partnering for exceptional care. Office: [phone]

## 2023-09-10 ENCOUNTER — Ambulatory Visit: Admitting: Surgery

## 2023-09-10 ENCOUNTER — Encounter: Payer: Self-pay | Admitting: Surgery

## 2023-09-10 VITALS — BP 123/82 | HR 85 | Ht 67.0 in | Wt 131.0 lb

## 2023-09-10 DIAGNOSIS — Z853 Personal history of malignant neoplasm of breast: Secondary | ICD-10-CM

## 2023-09-10 DIAGNOSIS — D0511 Intraductal carcinoma in situ of right breast: Secondary | ICD-10-CM

## 2023-09-10 NOTE — Patient Instructions (Addendum)
 Patient will be asked to return to the office in one year with a bilateral screening mammogram. We will send you a letter about these appointments.   Speak with your Oncologist about weather to continue with Diagnostic verses Screening mammogram for next year.   Continue self breast exams. Call office for any new breast issues or concerns.

## 2023-09-17 DIAGNOSIS — H524 Presbyopia: Secondary | ICD-10-CM | POA: Diagnosis not present

## 2023-09-18 ENCOUNTER — Other Ambulatory Visit: Payer: Self-pay | Admitting: Oncology

## 2023-09-19 ENCOUNTER — Other Ambulatory Visit: Payer: Self-pay | Admitting: Internal Medicine

## 2023-09-21 NOTE — Telephone Encounter (Signed)
 Requested Prescriptions  Pending Prescriptions Disp Refills   simvastatin  (ZOCOR ) 10 MG tablet [Pharmacy Med Name: SIMVASTATIN  10MG  TABLETS] 90 tablet 1    Sig: TAKE 1 TABLET(10 MG) BY MOUTH AT BEDTIME     Cardiovascular:  Antilipid - Statins Failed - 09/21/2023  3:06 PM      Failed - Lipid Panel in normal range within the last 12 months    Cholesterol  Date Value Ref Range Status  04/07/2023 168 <200 mg/dL Final   LDL Cholesterol (Calc)  Date Value Ref Range Status  04/07/2023 73 mg/dL (calc) Final    Comment:    Reference range: <100 . Desirable range <100 mg/dL for primary prevention;   <70 mg/dL for patients with CHD or diabetic patients  with > or = 2 CHD risk factors. SABRA LDL-C is now calculated using the Martin-Hopkins  calculation, which is a validated novel method providing  better accuracy than the Friedewald equation in the  estimation of LDL-C.  Gladis APPLETHWAITE et al. SANDREA. 7986;689(80): 2061-2068  (http://education.[facility].com/faq/FAQ164)    HDL  Date Value Ref Range Status  04/07/2023 81 > OR = 50 mg/dL Final   Triglycerides  Date Value Ref Range Status  04/07/2023 62 <150 mg/dL Final         Passed - Patient is not pregnant      Passed - Valid encounter within last 12 months    Recent Outpatient Visits           5 months ago Anxiety   [place] [facility] [place], Angeline ORN, NP   5 months ago Palpitations   [place] [facility] [place], Angeline ORN, [place]

## 2023-10-14 ENCOUNTER — Encounter: Admitting: Internal Medicine

## 2023-10-29 ENCOUNTER — Encounter: Payer: Self-pay | Admitting: Internal Medicine

## 2023-10-29 ENCOUNTER — Ambulatory Visit (INDEPENDENT_AMBULATORY_CARE_PROVIDER_SITE_OTHER): Admitting: Internal Medicine

## 2023-10-29 VITALS — BP 128/82 | Ht 67.0 in | Wt 133.4 lb

## 2023-10-29 DIAGNOSIS — Z0000 Encounter for general adult medical examination without abnormal findings: Secondary | ICD-10-CM | POA: Diagnosis not present

## 2023-10-29 DIAGNOSIS — R7303 Prediabetes: Secondary | ICD-10-CM | POA: Diagnosis not present

## 2023-10-29 DIAGNOSIS — Z0001 Encounter for general adult medical examination with abnormal findings: Secondary | ICD-10-CM | POA: Diagnosis not present

## 2023-10-29 DIAGNOSIS — E7800 Pure hypercholesterolemia, unspecified: Secondary | ICD-10-CM | POA: Diagnosis not present

## 2023-10-29 DIAGNOSIS — Z23 Encounter for immunization: Secondary | ICD-10-CM

## 2023-10-29 MED ORDER — FLUOXETINE HCL 20 MG PO CAPS: 20.0000 mg | ORAL_CAPSULE | Freq: Every day | ORAL | 1 refills | Status: AC

## 2023-10-29 NOTE — Progress Notes (Signed)
 Subjective:    Patient ID: Tracie Reed, female    DOB: Dec 21, 1945, 78 y.o.   MRN: [id number]  HPI  Patient presents to clinic today for her annual exam.  Flu: 09/2022 Tetanus: unsure COVID: X 6 Pneumovax: 10/2020 Prevnar: 12/2018 Shingrix: 11/2019, 10/2020 Pap smear: No longer screening Mammogram: 08/2023 Bone density: 08/2023 Colon screening: 07/2020 Vision screening: annually Dentist: biannually  Diet: She does not eat meat. She consumes fruits and veggies. She does not eat fried foods. She drinks mostly carbonated water , coffee Exercise: resistance bands and stretching, walking   Review of Systems     Past Medical History:  Diagnosis Date   Anxiety    Breast cancer (HCC)    DCIS (ductal carcinoma in situ) of breast    History of cataract    Personal history of radiation therapy    Pre-diabetes    last A1C in 2021 was 5.2   Sleep apnea    had uvula removed    Current Outpatient Medications  Medication Sig Dispense Refill   acyclovir  ointment (ZOVIRAX ) 5 % Apply 1 application topically every 3 (three) hours as needed. (Patient taking differently: Apply 1 application  topically every 3 (three) hours as needed (cold sores).) 15 g 1   anastrozole  (ARIMIDEX ) 1 MG tablet TAKE 1 TABLET BY MOUTH EVERY DAY 90 tablet 3   busPIRone  (BUSPAR ) 10 MG tablet Take 1 tablet (10 mg total) by mouth 2 (two) times daily. 270 tablet 1   CALCIUM-VITAMIN D PO Take 2 tablets by mouth daily.     cetirizine  (ZYRTEC ) 10 MG tablet TAKE 1 TABLET(10 MG) BY MOUTH DAILY 90 tablet 1   diclofenac  Sodium (VOLTAREN ) 1 % GEL Apply 1 application topically daily.     FLUoxetine  (PROZAC ) 20 MG capsule Take 1 capsule (20 mg total) by mouth daily. 90 capsule 1   ibuprofen  (ADVIL ) 800 MG tablet Take 1 tablet (800 mg total) by mouth every 8 (eight) hours as needed. 30 tablet 0   MILK THISTLE PO Take 1 capsule by mouth daily.     mometasone  (ELOCON ) 0.1 % ointment Apply topically daily. 45 g 0    simvastatin  (ZOCOR ) 10 MG tablet TAKE 1 TABLET(10 MG) BY MOUTH AT BEDTIME 90 tablet 1   traZODone  (DESYREL ) 50 MG tablet TAKE 3 TABLETS(150 MG) BY MOUTH AT BEDTIME 270 tablet 0   valACYclovir  (VALTREX ) 1000 MG tablet TAKE 2 TABLETS BY MOUTH AT START OF COLD SORE, THEN TAKE 1 TABLET DAILY UNTIL SORES HAVE RESOLVED 30 tablet 1   No current facility-administered medications for this visit.    No Known Allergies  Family History  Problem Relation Age of Onset   Breast cancer Mother    Diabetes Mother    Depression Mother    Healthy Brother     Social History   Socioeconomic History   Marital status: Significant Other    Spouse name: Not on file   Number of children: 2   Years of education: Not on file   Highest education level: Bachelor's degree (e.g., BA, AB, BS)  Occupational History   Occupation: self employed    Comment: Chief Operating Officer  Tobacco Use   Smoking status: Former    Current packs/day: 0.00    Average packs/day: 0.5 packs/day for 5.0 years (2.5 ttl pk-yrs)    Types: Cigarettes    Start date: 11/12/1982    Quit date: 11/12/1987    Years since quitting: 35.9    Passive exposure:  Past   Smokeless tobacco: Never  Vaping Use   Vaping status: Never Used  Substance and Sexual Activity   Alcohol use: Yes    Comment: 2 pints on weekend beer   Drug use: Not Currently    Comment: past marijuana - very remote use   Sexual activity: Yes  Other Topics Concern   Not on file  Social History Narrative   Not on file   Social Drivers of Health   Financial Resource Strain: Low Risk  (10/25/2023)   Overall Financial Resource Strain (CARDIA)    Difficulty of Paying Living Expenses: Not very hard  Food Insecurity: No Food Insecurity (10/25/2023)   Hunger Vital Sign    Worried About Running Out of Food in the Last Year: Never true    Ran Out of Food in the Last Year: Never true  Transportation Needs: No Transportation Needs (10/25/2023)   PRAPARE -  Administrator, Civil Service (Medical): No    Lack of Transportation (Non-Medical): No  Physical Activity: Insufficiently Active (10/25/2023)   Exercise Vital Sign    Days of Exercise per Week: 5 days    Minutes of Exercise per Session: 20 min  Stress: No Stress Concern Present (10/25/2023)   [employer] of Occupational Health - Occupational Stress Questionnaire    Feeling of Stress: Only a little  Social Connections: Moderately Isolated (10/25/2023)   Social Connection and Isolation Panel    Frequency of Communication with Friends and Family: Three times a week    Frequency of Social Gatherings with Friends and Family: Twice a week    Attends Religious Services: Never    Database administrator or Organizations: No    Attends Engineer, structural: Not on file    Marital Status: Living with partner  Intimate Partner Violence: Not At Risk (04/03/2023)   Humiliation, Afraid, Rape, and Kick questionnaire    Fear of Current or Ex-Partner: No    Emotionally Abused: No    Physically Abused: No    Sexually Abused: No     Constitutional: Denies fever, malaise, fatigue, headache or abrupt weight changes.  HEENT: Denies eye pain, eye redness, ear pain, ringing in the ears, wax buildup, runny nose, nasal congestion, bloody nose, or sore throat. Respiratory: Denies difficulty breathing, shortness of breath, cough or sputum production.   Cardiovascular: Denies chest pain, chest tightness, palpitations or swelling in the hands or feet.  Gastrointestinal:  Denies abdominal pain, bloating, constipation, diarrhea or blood in the stool.  GU: Denies urgency, frequency, pain with urination, burning sensation, blood in urine, odor or discharge. Musculoskeletal: Pt reports intermittent pain in the left subscapular region. Denies decrease in range of motion, difficulty with gait, or joint pain and swelling.  Skin: Denies redness, rashes, lesions or ulcercations.  Neurological:  Patient reports insomnia.  Denies dizziness, difficulty with memory, difficulty with speech or problems with balance and coordination.  Psych: Patient has a history of anxiety.  Denies depression, SI/HI.  No other specific complaints in a complete review of systems (except as listed in HPI above).  Objective:   Physical Exam  BP 128/82 (BP Location: Right Arm, Patient Position: Sitting, Cuff Size: Normal)   Ht 5' 7 (1.702 m)   Wt 133 lb 6.4 oz (60.5 kg)   BMI 20.89 kg/m    Wt Readings from Last 3 Encounters:  09/10/23 131 lb (59.4 kg)  05/12/23 132 lb 1.6 oz (59.9 kg)  04/30/23 138 lb (62.6 kg)  General: Appears her stated age, well developed, well nourished in NAD. Skin: Warm, dry and intact. HEENT: Head: normal shape and size; Eyes: sclera white, no icterus, conjunctiva pink, PERRLA and EOMs intact;  Neck:  Neck supple, trachea midline. No masses, lumps or thyromegaly present.  Cardiovascular: Normal rate and rhythm. S1,S2 noted.  No murmur, rubs or gallops noted. No JVD or BLE edema. No carotid bruits noted. Pulmonary/Chest: Normal effort and positive vesicular breath sounds. No respiratory distress. No wheezes, rales or ronchi noted.  Abdomen: Soft and nontender. Normal bowel sounds.  Musculoskeletal: Strength 5/5 BUE/BLE. No difficulty with gait.  Neurological: Alert and oriented. Cranial nerves II-XII grossly intact. Coordination normal.  Psychiatric: Mood and affect normal. Behavior is normal. Judgment and thought content normal.    BMET    Component Value Date/Time   NA 138 04/07/2023 1121   K 4.0 04/07/2023 1121   CL 100 04/07/2023 1121   CO2 29 04/07/2023 1121   GLUCOSE 101 (H) 04/07/2023 1121   BUN 7 04/07/2023 1121   CREATININE 0.60 04/07/2023 1121   CALCIUM 9.8 04/07/2023 1121   GFRNONAA >60 10/09/2020 1143   GFRNONAA 86 07/19/2020 1026   GFRAA 100 07/19/2020 1026    Lipid Panel     Component Value Date/Time   CHOL 168 04/07/2023 1121   TRIG 62  04/07/2023 1121   HDL 81 04/07/2023 1121   CHOLHDL 2.1 04/07/2023 1121   LDLCALC 73 04/07/2023 1121    CBC    Component Value Date/Time   WBC 9.4 04/07/2023 1121   RBC 4.42 04/07/2023 1121   HGB 13.6 04/07/2023 1121   HCT 40.3 04/07/2023 1121   PLT 242 04/07/2023 1121   MCV 91.2 04/07/2023 1121   MCH 30.8 04/07/2023 1121   MCHC 33.7 04/07/2023 1121   RDW 12.8 04/07/2023 1121   LYMPHSABS 2.0 10/09/2020 1143   MONOABS 0.5 10/09/2020 1143   EOSABS 0.1 10/09/2020 1143   BASOSABS 0.0 10/09/2020 1143    Hgb A1C Lab Results  Component Value Date   HGBA1C 5.7 (H) 04/07/2023            Assessment & Plan:   Preventative health maintenance:  Flu shot today She declines tetanus for financial reasons, advised if she gets bit or cut to go get this done Encouraged her to get her COVID-vaccine Pneumovax and Prevnar UTD Shingrix UTD She no longer needs to screen for cervical cancer Mammogram UTD Bone density UTD She no longer needs to screen for colon cancer Encouraged her to consume a balanced diet and exercise regimen We will check CBC, c-Met, lipid, A1c today  RTC in 6 months, follow-up chronic conditions Angeline Laura, NP

## 2023-10-29 NOTE — Patient Instructions (Signed)
 Health Maintenance for Postmenopausal Women Menopause is a normal process in which your ability to get pregnant comes to an end. This process happens slowly over many months or years, usually between the ages of 76 and 38. Menopause is complete when you have missed your menstrual period for 12 months. It is important to talk with your health care provider about some of the most common conditions that affect women after menopause (postmenopausal women). These include heart disease, cancer, and bone loss (osteoporosis). Adopting a healthy lifestyle and getting preventive care can help to promote your health and wellness. The actions you take can also lower your chances of developing some of these common conditions. What are the signs and symptoms of menopause? During menopause, you may have the following symptoms: Hot flashes. These can be moderate or severe. Night sweats. Decrease in sex drive. Mood swings. Headaches. Tiredness (fatigue). Irritability. Memory problems. Problems falling asleep or staying asleep. Talk with your health care provider about treatment options for your symptoms. Do I need hormone replacement therapy? Hormone replacement therapy is effective in treating symptoms that are caused by menopause, such as hot flashes and night sweats. Hormone replacement carries certain risks, especially as you become older. If you are thinking about using estrogen or estrogen with progestin, discuss the benefits and risks with your health care provider. How can I reduce my risk for heart disease and stroke? The risk of heart disease, heart attack, and stroke increases as you age. One of the causes may be a change in the body's hormones during menopause. This can affect how your body uses dietary fats, triglycerides, and cholesterol. Heart attack and stroke are medical emergencies. There are many things that you can do to help prevent heart disease and stroke. Watch your blood pressure High  blood pressure causes heart disease and increases the risk of stroke. This is more likely to develop in people who have high blood pressure readings or are overweight. Have your blood pressure checked: Every 3-5 years if you are 32-23 years of age. Every year if you are 31 years old or older. Eat a healthy diet  Eat a diet that includes plenty of vegetables, fruits, low-fat dairy products, and lean protein. Do not eat a lot of foods that are high in solid fats, added sugars, or sodium. Get regular exercise Get regular exercise. This is one of the most important things you can do for your health. Most adults should: Try to exercise for at least 150 minutes each week. The exercise should increase your heart rate and make you sweat (moderate-intensity exercise). Try to do strengthening exercises at least twice each week. Do these in addition to the moderate-intensity exercise. Spend less time sitting. Even light physical activity can be beneficial. Other tips Work with your health care provider to achieve or maintain a healthy weight. Do not use any products that contain nicotine or tobacco. These products include cigarettes, chewing tobacco, and vaping devices, such as e-cigarettes. If you need help quitting, ask your health care provider. Know your numbers. Ask your health care provider to check your cholesterol and your blood sugar (glucose). Continue to have your blood tested as directed by your health care provider. Do I need screening for cancer? Depending on your health history and family history, you may need to have cancer screenings at different stages of your life. This may include screening for: Breast cancer. Cervical cancer. Lung cancer. Colorectal cancer. What is my risk for osteoporosis? After menopause, you may be  at increased risk for osteoporosis. Osteoporosis is a condition in which bone destruction happens more quickly than new bone creation. To help prevent osteoporosis or  the bone fractures that can happen because of osteoporosis, you may take the following actions: If you are 24-54 years old, get at least 1,000 mg of calcium and at least 600 international units (IU) of vitamin D  per day. If you are older than age 75 but younger than age 30, get at least 1,200 mg of calcium and at least 600 international units (IU) of vitamin D  per day. If you are older than age 8, get at least 1,200 mg of calcium and at least 800 international units (IU) of vitamin D  per day. Smoking and drinking excessive alcohol increase the risk of osteoporosis. Eat foods that are rich in calcium and vitamin D , and do weight-bearing exercises several times each week as directed by your health care provider. How does menopause affect my mental health? Depression may occur at any age, but it is more common as you become older. Common symptoms of depression include: Feeling depressed. Changes in sleep patterns. Changes in appetite or eating patterns. Feeling an overall lack of motivation or enjoyment of activities that you previously enjoyed. Frequent crying spells. Talk with your health care provider if you think that you are experiencing any of these symptoms. General instructions See your health care provider for regular wellness exams and vaccines. This may include: Scheduling regular health, dental, and eye exams. Getting and maintaining your vaccines. These include: Influenza vaccine. Get this vaccine each year before the flu season begins. Pneumonia vaccine. Shingles vaccine. Tetanus, diphtheria, and pertussis (Tdap) booster vaccine. Your health care provider may also recommend other immunizations. Tell your health care provider if you have ever been abused or do not feel safe at home. Summary Menopause is a normal process in which your ability to get pregnant comes to an end. This condition causes hot flashes, night sweats, decreased interest in sex, mood swings, headaches, or lack  of sleep. Treatment for this condition may include hormone replacement therapy. Take actions to keep yourself healthy, including exercising regularly, eating a healthy diet, watching your weight, and checking your blood pressure and blood sugar levels. Get screened for cancer and depression. Make sure that you are up to date with all your vaccines. This information is not intended to replace advice given to you by your health care provider. Make sure you discuss any questions you have with your health care provider. Document Revised: 06/04/2020 Document Reviewed: 06/04/2020 Elsevier Patient Education  2024 [employer].

## 2023-10-30 ENCOUNTER — Ambulatory Visit: Payer: Self-pay | Admitting: Internal Medicine

## 2023-10-30 LAB — COMPREHENSIVE METABOLIC PANEL WITH GFR
AG Ratio: 2 (calc) (ref 1.0–2.5)
ALT: 15 U/L (ref 6–29)
AST: 20 U/L (ref 10–35)
Albumin: 4.5 g/dL (ref 3.6–5.1)
Alkaline phosphatase (APISO): 64 U/L (ref 37–153)
BUN: 7 mg/dL (ref 7–25)
CO2: 32 mmol/L (ref 20–32)
Calcium: 10 mg/dL (ref 8.6–10.4)
Chloride: 103 mmol/L (ref 98–110)
Creat: 0.62 mg/dL (ref 0.60–1.00)
Globulin: 2.2 g/dL (ref 1.9–3.7)
Glucose, Bld: 88 mg/dL (ref 65–139)
Potassium: 4.8 mmol/L (ref 3.5–5.3)
Sodium: 141 mmol/L (ref 135–146)
Total Bilirubin: 0.5 mg/dL (ref 0.2–1.2)
Total Protein: 6.7 g/dL (ref 6.1–8.1)
eGFR: 91 mL/min/1.73m2 (ref >=60)

## 2023-10-30 LAB — LIPID PANEL
Cholesterol: 171 mg/dL (ref <200)
HDL: 58 mg/dL (ref >=50)
LDL Cholesterol (Calc): 89 mg/dL
Non-HDL Cholesterol (Calc): 113 mg/dL (ref <130)
Total CHOL/HDL Ratio: 2.9 (calc) (ref <5.0)
Triglycerides: 140 mg/dL (ref <150)

## 2023-10-30 LAB — CBC
HCT: 41.1 % (ref 35.0–45.0)
Hemoglobin: 14.5 g/dL (ref 11.7–15.5)
MCH: 32.6 pg (ref 27.0–33.0)
MCHC: 35.3 g/dL (ref 32.0–36.0)
MCV: 92.4 fL (ref 80.0–100.0)
MPV: 10.7 fL (ref 7.5–12.5)
Platelets: 254 Thousand/uL (ref 140–400)
RBC: 4.45 Million/uL (ref 3.80–5.10)
RDW: 12.6 % (ref 11.0–15.0)
WBC: 8.1 Thousand/uL (ref 3.8–10.8)

## 2023-10-30 LAB — HEMOGLOBIN A1C
Hgb A1c MFr Bld: 5.7 % — ABNORMAL HIGH (ref <5.7)
Mean Plasma Glucose: 117 mg/dL
eAG (mmol/L): 6.5 mmol/L

## 2023-11-11 ENCOUNTER — Ambulatory Visit: Admitting: Oncology

## 2023-11-17 ENCOUNTER — Encounter: Payer: Self-pay | Admitting: Oncology

## 2023-11-17 ENCOUNTER — Inpatient Hospital Stay: Attending: Oncology | Admitting: Oncology

## 2023-11-17 VITALS — BP 119/80 | HR 68 | Temp 97.6°F | Resp 18 | Ht 67.0 in | Wt 132.9 lb

## 2023-11-17 DIAGNOSIS — Z79811 Long term (current) use of aromatase inhibitors: Secondary | ICD-10-CM | POA: Insufficient documentation

## 2023-11-17 DIAGNOSIS — Z9049 Acquired absence of other specified parts of digestive tract: Secondary | ICD-10-CM | POA: Diagnosis not present

## 2023-11-17 DIAGNOSIS — Z803 Family history of malignant neoplasm of breast: Secondary | ICD-10-CM | POA: Insufficient documentation

## 2023-11-17 DIAGNOSIS — Z86000 Personal history of in-situ neoplasm of breast: Secondary | ICD-10-CM

## 2023-11-17 DIAGNOSIS — Z5181 Encounter for therapeutic drug level monitoring: Secondary | ICD-10-CM

## 2023-11-17 DIAGNOSIS — Z833 Family history of diabetes mellitus: Secondary | ICD-10-CM | POA: Insufficient documentation

## 2023-11-17 DIAGNOSIS — F419 Anxiety disorder, unspecified: Secondary | ICD-10-CM | POA: Diagnosis not present

## 2023-11-17 DIAGNOSIS — Z818 Family history of other mental and behavioral disorders: Secondary | ICD-10-CM | POA: Diagnosis not present

## 2023-11-17 DIAGNOSIS — D0511 Intraductal carcinoma in situ of right breast: Secondary | ICD-10-CM | POA: Insufficient documentation

## 2023-11-17 DIAGNOSIS — Z9849 Cataract extraction status, unspecified eye: Secondary | ICD-10-CM | POA: Insufficient documentation

## 2023-11-17 DIAGNOSIS — Z87891 Personal history of nicotine dependence: Secondary | ICD-10-CM | POA: Insufficient documentation

## 2023-11-17 DIAGNOSIS — Z08 Encounter for follow-up examination after completed treatment for malignant neoplasm: Secondary | ICD-10-CM

## 2023-11-17 DIAGNOSIS — Z923 Personal history of irradiation: Secondary | ICD-10-CM | POA: Diagnosis not present

## 2023-11-17 DIAGNOSIS — Z79899 Other long term (current) drug therapy: Secondary | ICD-10-CM | POA: Insufficient documentation

## 2023-11-17 DIAGNOSIS — M85832 Other specified disorders of bone density and structure, left forearm: Secondary | ICD-10-CM | POA: Diagnosis not present

## 2023-11-17 NOTE — Progress Notes (Signed)
 Patient doing well and has no new or acute concerns at this time.

## 2023-11-17 NOTE — Progress Notes (Signed)
 Hematology/Oncology Consult note [facility]  [phone] [phone]  Patient Care Team: Antonette Angeline ORN, NP as PCP - General (Internal Medicine) Cindie Jesusa HERO, RN as Oncology Nurse Navigator Melanee Annah BROCKS, MD as Consulting Physician (Oncology) Lenn Aran, MD as Consulting Physician (Radiation Oncology) Lane Shope, MD as Consulting Physician (General Surgery)   Name of the patient: Tracie Reed  [id number]  1945/02/19   Date of visit: 11/17/23  Diagnosis-right breast DCIS ER positive  Chief complaint/ Reason for visit-routine follow-up of DCIS  Heme/Onc history: Patient is a 78 year old female who was 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  There was an additional medial margin that was excised which showed an isolated focus of intermediate grade DCIS.  On biopsy the size of DCIS was 4 mm.  Margins negative.  ER greater than 90% positive.  Patient underwent postlumpectomy radiation treatment.  She did start on Arimidex  in January 2023, stopped it briefly and then restarted in July 2023.     Interval history-she is tolerating Arimidex  well without any significant side effects.  She was previously taking turmeric for her arthritis but was concerned about potential interactions with Arimidex  and therefore stopped taking it  ECOG PS- 1 Pain scale- 0   Review of systems- Review of Systems  Constitutional:  Negative for chills, fever, malaise/fatigue and weight loss.  HENT:  Negative for congestion, ear discharge and nosebleeds.   Eyes:  Negative for blurred vision.  Respiratory:  Negative for cough, hemoptysis, sputum production, shortness of breath and wheezing.   Cardiovascular:  Negative for chest pain, palpitations, orthopnea and claudication.  Gastrointestinal:  Negative for abdominal pain, blood in stool,  constipation, diarrhea, heartburn, melena, nausea and vomiting.  Genitourinary:  Negative for dysuria, flank pain, frequency, hematuria and urgency.  Musculoskeletal:  Negative for back pain, joint pain and myalgias.  Skin:  Negative for rash.  Neurological:  Negative for dizziness, tingling, focal weakness, seizures, weakness and headaches.  Endo/Heme/Allergies:  Does not bruise/bleed easily.  Psychiatric/Behavioral:  Negative for depression and suicidal ideas. The patient does not have insomnia.       No Known Allergies   Past Medical History:  Diagnosis Date   Anxiety    Breast cancer (HCC)    Cataract 06/2020   DCIS (ductal carcinoma in situ) of breast    History of cataract    Personal history of radiation therapy    Pre-diabetes    last A1C in 2021 was 5.2   Sleep apnea    had uvula removed     Past Surgical History:  Procedure Laterality Date   APPENDECTOMY  1970   BREAST BIOPSY Right 09/14/2020   Affirm bx-calcs, Ribbon marker-dcis   BREAST EXCISIONAL BIOPSY Right 1993   neg   BREAST LUMPECTOMY Right 10/12/2020   DCIS, CLEAR MARGINS   BREAST LUMPECTOMY WITH RADIOFREQUENCY TAG IDENTIFICATION Right 10/12/2020   Procedure: BREAST LUMPECTOMY WITH RADIOFREQUENCY TAG IDENTIFICATION;  Surgeon: Lane Shope, MD;  Location: [facility] ORS;  Service: General;  Laterality: Right;   BUNIONECTOMY Bilateral    CATARACT EXTRACTION  09/03/2020   CHOLECYSTECTOMY     COLONOSCOPY WITH PROPOFOL  N/A 07/31/2020   Procedure: COLONOSCOPY WITH PROPOFOL ;  Surgeon: Jinny Carmine, MD;  Location: [facility] ENDOSCOPY;  Service: Endoscopy;  Laterality: N/A;   EYE SURGERY  07/2020   IRIDOTOMY / IRIDECTOMY Right    UVULECTOMY  Social History   Socioeconomic History   Marital status: Significant Other    Spouse name: Not on file   Number of children: 2   Years of education: Not on file   Highest education level: Bachelor's degree (e.g., BA, AB, BS)  Occupational History   Occupation:  self employed    Comment: Chief Operating Officer  Tobacco Use   Smoking status: Former    Current packs/day: 0.00    Average packs/day: 0.5 packs/day for 5.0 years (2.5 ttl pk-yrs)    Types: Cigarettes    Start date: 11/12/1982    Quit date: 11/12/1987    Years since quitting: 36.0    Passive exposure: Past   Smokeless tobacco: Never  Vaping Use   Vaping status: Never Used  Substance and Sexual Activity   Alcohol use: Yes    Alcohol/week: 2.0 standard drinks of alcohol    Types: 2 Cans of beer per week    Comment: 2 pints on weekend beer   Drug use: Not Currently    Comment: past marijuana - very remote use   Sexual activity: Not Currently  Other Topics Concern   Not on file  Social History Narrative   Not on file   Social Drivers of Health   Financial Resource Strain: Low Risk  (10/25/2023)   Overall Financial Resource Strain (CARDIA)    Difficulty of Paying Living Expenses: Not very hard  Food Insecurity: No Food Insecurity (10/25/2023)   Hunger Vital Sign    Worried About Running Out of Food in the Last Year: Never true    Ran Out of Food in the Last Year: Never true  Transportation Needs: No Transportation Needs (10/25/2023)   PRAPARE - Administrator, Civil Service (Medical): No    Lack of Transportation (Non-Medical): No  Physical Activity: Insufficiently Active (10/25/2023)   Exercise Vital Sign    Days of Exercise per Week: 5 days    Minutes of Exercise per Session: 20 min  Stress: No Stress Concern Present (10/25/2023)   [employer] of Occupational Health - Occupational Stress Questionnaire    Feeling of Stress: Only a little  Social Connections: Moderately Isolated (10/25/2023)   Social Connection and Isolation Panel    Frequency of Communication with Friends and Family: Three times a week    Frequency of Social Gatherings with Friends and Family: Twice a week    Attends Religious Services: Never    Database administrator or  Organizations: No    Attends Engineer, structural: Not on file    Marital Status: Living with partner  Intimate Partner Violence: Not At Risk (04/03/2023)   Humiliation, Afraid, Rape, and Kick questionnaire    Fear of Current or Ex-Partner: No    Emotionally Abused: No    Physically Abused: No    Sexually Abused: No    Family History  Problem Relation Age of Onset   Breast cancer Mother    Diabetes Mother    Depression Mother    Healthy Brother      Current Outpatient Medications:    acyclovir  ointment (ZOVIRAX ) 5 %, Apply 1 application topically every 3 (three) hours as needed., Disp: 15 g, Rfl: 1   anastrozole  (ARIMIDEX ) 1 MG tablet, TAKE 1 TABLET BY MOUTH EVERY DAY, Disp: 90 tablet, Rfl: 3   busPIRone  (BUSPAR ) 10 MG tablet, Take 1 tablet (10 mg total) by mouth 2 (two) times daily., Disp: 270 tablet, Rfl: 1  CALCIUM-VITAMIN D PO, Take 2 tablets by mouth daily., Disp: , Rfl:    cetirizine  (ZYRTEC ) 10 MG tablet, TAKE 1 TABLET(10 MG) BY MOUTH DAILY, Disp: 90 tablet, Rfl: 1   diclofenac  Sodium (VOLTAREN ) 1 % GEL, Apply 1 application topically daily., Disp: , Rfl:    FLUoxetine  (PROZAC ) 20 MG capsule, Take 1 capsule (20 mg total) by mouth daily., Disp: 90 capsule, Rfl: 1   ibuprofen  (ADVIL ) 800 MG tablet, Take 1 tablet (800 mg total) by mouth every 8 (eight) hours as needed., Disp: 30 tablet, Rfl: 0   mometasone  (ELOCON ) 0.1 % ointment, Apply topically daily., Disp: 45 g, Rfl: 0   simvastatin  (ZOCOR ) 10 MG tablet, TAKE 1 TABLET(10 MG) BY MOUTH AT BEDTIME, Disp: 90 tablet, Rfl: 1   traZODone  (DESYREL ) 50 MG tablet, TAKE 3 TABLETS(150 MG) BY MOUTH AT BEDTIME, Disp: 270 tablet, Rfl: 0   valACYclovir  (VALTREX ) 1000 MG tablet, TAKE 2 TABLETS BY MOUTH AT START OF COLD SORE, THEN TAKE 1 TABLET DAILY UNTIL SORES HAVE RESOLVED, Disp: 30 tablet, Rfl: 1   MILK THISTLE PO, Take 1 capsule by mouth daily. (Patient not taking: Reported on 11/17/2023), Disp: , Rfl:   Physical exam:  Vitals:    11/17/23 1108  BP: 119/80  Pulse: 68  Resp: 18  Temp: 97.6 F (36.4 C)  TempSrc: Tympanic  SpO2: 100%  Weight: 132 lb 14.4 oz (60.3 kg)  Height: 5' 7 (1.702 m)   Physical Exam Cardiovascular:     Rate and Rhythm: Normal rate and regular rhythm.     Heart sounds: Normal heart sounds.  Pulmonary:     Effort: Pulmonary effort is normal.     Breath sounds: Normal breath sounds.  Skin:    General: Skin is warm and dry.  Neurological:     Mental Status: She is alert and oriented to person, place, and time.    Breast exam was performed in seated and lying down position. Patient is status post right lumpectomy with a well-healed surgical scar. No evidence of any palpable masses. No evidence of axillary adenopathy. No evidence of any palpable masses or lumps in the left breast. No evidence of leftt axillary adenopathy   I have personally reviewed labs listed below:    Latest Ref Rng & Units 10/29/2023   10:30 AM  CMP  Glucose 65 - 139 mg/dL 88   BUN 7 - 25 mg/dL 7   Creatinine 9.39 - 8.99 mg/dL 9.37   Sodium 864 - 853 mmol/L 141   Potassium 3.5 - 5.3 mmol/L 4.8   Chloride 98 - 110 mmol/L 103   CO2 20 - 32 mmol/L 32   Calcium 8.6 - 10.4 mg/dL 89.9   Total Protein 6.1 - 8.1 g/dL 6.7   Total Bilirubin 0.2 - 1.2 mg/dL 0.5   AST 10 - 35 U/L 20   ALT 6 - 29 U/L 15       Latest Ref Rng & Units 10/29/2023   10:30 AM  CBC  WBC 3.8 - 10.8 Thousand/uL 8.1   Hemoglobin 11.7 - 15.5 g/dL 85.4   Hematocrit 64.9 - 45.0 % 41.1   Platelets 140 - 400 Thousand/uL 254      Assessment and plan- Patient is a 78 y.o. 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 Underwent surgery and radiation. On anastrozole  since January 2023  without major issues. Discussed turmeric supplementation, confirmed no interaction with anastrozole . - Continue anastrozole  therapy which she will continue until January 2028 - Turmeric supplementation is safe to resume.  Osteopenia with declining bone density on anastrozole  therapy Bone density scan shows osteopenia with a 16% decline in the T-score in her left forearm from -1.3 presently to -2.1. - Continue calcium and vitamin D supplementation.  I did discuss pros and cons of Fosamax today and we could consider starting it at this time given the decline in her bone density versus waiting until her next bone density scan.  She would like to wait - Consider Fosamax if bone density declines further.   Visit Diagnosis 1. Encounter for follow-up surveillance of ductal carcinoma in situ (DCIS) of breast   2. Osteopenia of left forearm   3. Visit for monitoring Arimidex  therapy   4. High risk medication use      Dr. Annah Skene, MD, MPH [facility] [phone] 11/17/2023 12:54 PM

## 2023-11-20 ENCOUNTER — Other Ambulatory Visit: Payer: Self-pay | Admitting: Internal Medicine

## 2023-11-20 DIAGNOSIS — F5104 Psychophysiologic insomnia: Secondary | ICD-10-CM

## 2023-11-21 NOTE — Telephone Encounter (Signed)
 Requested Prescriptions  Pending Prescriptions Disp Refills   traZODone  (DESYREL ) 50 MG tablet [Pharmacy Med Name: TRAZODONE  50MG  TABLETS] 270 tablet 1    Sig: TAKE 3 TABLETS(150 MG) BY MOUTH AT BEDTIME     Psychiatry: Antidepressants - Serotonin Modulator Passed - 11/21/2023  9:29 PM      Passed - Valid encounter within last 6 months    Recent Outpatient Visits           3 weeks ago Encounter for general adult medical examination w/o abnormal findings   [place] [facility] [place], Angeline ORN, NP   7 months ago Anxiety   [place] [facility] [place], Angeline ORN, NP   7 months ago Palpitations   [place] [facility] [place], Angeline ORN, [place]

## 2023-12-15 ENCOUNTER — Other Ambulatory Visit: Payer: Self-pay | Admitting: Internal Medicine

## 2023-12-17 NOTE — Telephone Encounter (Signed)
 Requested Prescriptions  Pending Prescriptions Disp Refills   busPIRone  (BUSPAR ) 10 MG tablet [Pharmacy Med Name: BUSPIRONE  10MG  TABLETS] 180 tablet 3    Sig: TAKE 1 TABLET BY MOUTH TWICE DAILY     Psychiatry: Anxiolytics/Hypnotics - Non-controlled Passed - 12/17/2023  2:24 PM      Passed - Valid encounter within last 12 months    Recent Outpatient Visits           1 month ago Encounter for general adult medical examination w/o abnormal findings   [place] [facility] [place], Angeline ORN, NP   7 months ago Anxiety   [place] [facility] [place], Angeline ORN, NP   8 months ago Palpitations    [place] [facility] [place], Angeline ORN, [place]

## 2023-12-22 ENCOUNTER — Other Ambulatory Visit: Payer: Self-pay | Admitting: Internal Medicine

## 2023-12-23 NOTE — Telephone Encounter (Signed)
 Rx 09/21/23 #90 1RF- too soon Requested Prescriptions  Pending Prescriptions Disp Refills   simvastatin  (ZOCOR ) 10 MG tablet [Pharmacy Med Name: SIMVASTATIN  10MG  TABLETS] 90 tablet 1    Sig: TAKE 1 TABLET(10 MG) BY MOUTH AT BEDTIME     Cardiovascular:  Antilipid - Statins Failed - 12/23/2023 11:18 AM      Failed - Lipid Panel in normal range within the last 12 months    Cholesterol  Date Value Ref Range Status  10/29/2023 171 <200 mg/dL Final   LDL Cholesterol (Calc)  Date Value Ref Range Status  10/29/2023 89 mg/dL (calc) Final    Comment:    Reference range: <100 . Desirable range <100 mg/dL for primary prevention;   <70 mg/dL for patients with CHD or diabetic patients  with > or = 2 CHD risk factors. SABRA LDL-C is now calculated using the Martin-Hopkins  calculation, which is a validated novel method providing  better accuracy than the Friedewald equation in the  estimation of LDL-C.  Gladis APPLETHWAITE et al. SANDREA. 7986;689(80): 2061-2068  (http://education.[employer].com/faq/FAQ164)    HDL  Date Value Ref Range Status  10/29/2023 58 > OR = 50 mg/dL Final   Triglycerides  Date Value Ref Range Status  10/29/2023 140 <150 mg/dL Final         Passed - Patient is not pregnant      Passed - Valid encounter within last 12 months    Recent Outpatient Visits           1 month ago Encounter for general adult medical examination w/o abnormal findings   [place] [facility] [place], Angeline ORN, NP   8 months ago Anxiety    [facility] [place], Angeline ORN, NP   8 months ago Palpitations    [facility] [place], Angeline ORN, [place]

## 2024-01-30 ENCOUNTER — Other Ambulatory Visit: Payer: Self-pay | Admitting: Internal Medicine

## 2024-01-30 DIAGNOSIS — R059 Cough, unspecified: Secondary | ICD-10-CM

## 2024-02-01 NOTE — Telephone Encounter (Signed)
 Requested Prescriptions  Pending Prescriptions Disp Refills   cetirizine  (ZYRTEC ) 10 MG tablet [Pharmacy Med Name: CETIRIZINE  10MG  TABLETS] 90 tablet 0    Sig: TAKE 1 TABLET(10 MG) BY MOUTH DAILY     Ear, Nose, and Throat:  Antihistamines 2 Passed - 02/01/2024  3:36 PM      Passed - Cr in normal range and within 360 days    Creat  Date Value Ref Range Status  10/29/2023 0.62 0.60 - 1.00 mg/dL Final         Passed - Valid encounter within last 12 months    Recent Outpatient Visits           3 months ago Encounter for general adult medical examination w/o abnormal findings   [place] [facility] [place], Angeline ORN, NP   9 months ago Anxiety   [place] [facility] [place], Angeline ORN, NP   10 months ago Palpitations   [place] [facility] [place], Angeline ORN, [place]

## 2024-02-19 ENCOUNTER — Other Ambulatory Visit: Payer: Self-pay | Admitting: Internal Medicine

## 2024-02-19 DIAGNOSIS — F5104 Psychophysiologic insomnia: Secondary | ICD-10-CM

## 2024-02-19 MED ORDER — TRAZODONE HCL 50 MG PO TABS: 150.0000 mg | ORAL_TABLET | Freq: Every day | ORAL | 1 refills | Status: AC

## 2024-02-19 NOTE — Telephone Encounter (Signed)
 Copied from CRM [id number]. Topic: Clinical - Medication Refill >> Feb 19, 2024 10:12 AM Wess RAMAN wrote: Medication: traZODone  (DESYREL ) 50 MG tablet   Has the patient contacted their pharmacy? Yes (Agent: If no, request that the patient contact the pharmacy for the refill. If patient does not wish to contact the pharmacy document the reason why and proceed with request.) (Agent: If yes, when and what did the pharmacy advise?)  This is the patient's preferred pharmacy:   [id number] - [place], [place] - [address] [address], [place] [address] [place]  [address] [place] [address] Phone: [phone] Fax: [phone]  Is this the correct pharmacy for this prescription? Yes If no, delete pharmacy and type the correct one.   Has the prescription been filled recently? Yes  Is the patient out of the medication? No  Has the patient been seen for an appointment in the last year OR does the patient have an upcoming appointment? Yes  Can we respond through MyChart? Yes  Agent: Please be advised that Rx refills may take up to 3 business days. We ask that you follow-up with your pharmacy.

## 2024-03-18 ENCOUNTER — Inpatient Hospital Stay: Admitting: Oncology

## 2024-04-08 ENCOUNTER — Ambulatory Visit

## 2024-04-13 ENCOUNTER — Ambulatory Visit

## 2024-04-28 ENCOUNTER — Ambulatory Visit: Admitting: Internal Medicine

## 2024-05-24 ENCOUNTER — Inpatient Hospital Stay: Admitting: Oncology
